# Patient Record
Sex: Female | Born: 1945 | Race: Black or African American | Hispanic: No | Marital: Married | Smoking: Former smoker
Health system: Southern US, Community
[De-identification: ages and names within clinical notes are randomized; demographics above are authoritative.]

## PROBLEM LIST (undated history)

## (undated) DIAGNOSIS — K219 Gastro-esophageal reflux disease without esophagitis: Secondary | ICD-10-CM

## (undated) DIAGNOSIS — C50919 Malignant neoplasm of unspecified site of unspecified female breast: Secondary | ICD-10-CM

## (undated) DIAGNOSIS — I10 Essential (primary) hypertension: Secondary | ICD-10-CM

## (undated) DIAGNOSIS — M1990 Unspecified osteoarthritis, unspecified site: Secondary | ICD-10-CM

## (undated) DIAGNOSIS — E781 Pure hyperglyceridemia: Secondary | ICD-10-CM

## (undated) DIAGNOSIS — E669 Obesity, unspecified: Secondary | ICD-10-CM

## (undated) HISTORY — DX: Pure hyperglyceridemia: E78.1

## (undated) HISTORY — PX: CATARACT EXTRACTION: SUR2

## (undated) HISTORY — PX: BILATERAL SALPINGOOPHORECTOMY: SHX1223

## (undated) HISTORY — DX: Unspecified osteoarthritis, unspecified site: M19.90

## (undated) HISTORY — PX: APPENDECTOMY: SHX54

## (undated) HISTORY — PX: TUBAL LIGATION: SHX77

## (undated) HISTORY — PX: TONSILLECTOMY: SUR1361

## (undated) HISTORY — DX: Obesity, unspecified: E66.9

## (undated) HISTORY — DX: Essential (primary) hypertension: I10

## (undated) HISTORY — PX: BREAST LUMPECTOMY: SHX2

---

## 2004-12-03 HISTORY — PX: HYSTEROSCOPY WITH D & C: SHX1775

## 2008-07-20 HISTORY — PX: EYE SURGERY: SHX253

## 2013-08-25 ENCOUNTER — Encounter: Payer: Self-pay | Admitting: Vascular Surgery

## 2013-08-25 ENCOUNTER — Other Ambulatory Visit: Payer: Self-pay | Admitting: *Deleted

## 2013-08-25 DIAGNOSIS — M79609 Pain in unspecified limb: Secondary | ICD-10-CM

## 2013-10-05 ENCOUNTER — Encounter: Payer: Self-pay | Admitting: Vascular Surgery

## 2013-10-06 ENCOUNTER — Ambulatory Visit (HOSPITAL_COMMUNITY)
Admission: RE | Admit: 2013-10-06 | Discharge: 2013-10-06 | Disposition: A | Discharge status: Home / Self Care | Payer: Medicare Other | Source: Ambulatory Visit | Attending: Vascular Surgery | Admitting: Vascular Surgery

## 2013-10-06 ENCOUNTER — Encounter: Payer: Self-pay | Admitting: Vascular Surgery

## 2013-10-06 ENCOUNTER — Ambulatory Visit (INDEPENDENT_AMBULATORY_CARE_PROVIDER_SITE_OTHER): Payer: Medicare Other | Admitting: Vascular Surgery

## 2013-10-06 VITALS — BP 140/74 | HR 72 | Wt 271.3 lb

## 2013-10-06 DIAGNOSIS — M79609 Pain in unspecified limb: Secondary | ICD-10-CM | POA: Insufficient documentation

## 2013-10-06 DIAGNOSIS — I83893 Varicose veins of bilateral lower extremities with other complications: Secondary | ICD-10-CM

## 2013-10-06 NOTE — Progress Notes (Signed)
Referred by:  [place] [address]  Reason for referral: B leg pain and swelling (L>R)  History of Present Illness  Emily Gray is a 68 y.o. ([birth year]) female who presents with chief complaint: swollen pain legs (L>R).  Patient notes, onset of swelling 3-4 months ago, associated with no obvious trigger.  The patient's symptoms include: aching in lateral thigh and medial calf associated with swelling.  The patient has had never history of DVT, no history of pregnancy, known history of varicose vein, no history of venous stasis ulcers, no history of  Lymphedema and no history of skin changes in lower legs.  There is no family history of venous disorders.  The patient has never used compression stockings in the past.  Past Medical History  Diagnosis Date  . Arthritis   . Pure hyperglyceridemia   . Hypertension   . Obesity, unspecified     Past Surgical History  Procedure Laterality Date  . Cataract extraction Left   . Eye surgery Left 07/2008    macro-hole, filled with gel substance at [facility]  . Hysteroscopy w/d&c  12/03/2004  . Appendectomy    . Bilateral salpingoophorectomy    . Tubal ligation Bilateral   . Tonsillectomy      History   Social History  . Marital Status: Married    Spouse Name: N/A    Number of Children: N/A  . Years of Education: N/A   Occupational History  . Not on file.   Social History Main Topics  . Smoking status: Former Research scientist (life sciences)  . Smokeless tobacco: Never Used  . Alcohol Use: No  . Drug Use: No  . Sexual Activity: Not on file   Other Topics Concern  . Not on file   Social History Narrative  . No narrative on file    Family History  Problem Relation Age of Onset  . Hypertension Mother   . Cancer Father   . Hypertension Father     Current Outpatient Prescriptions on File Prior to Visit  Medication Sig Dispense Refill  . Ascorbic Acid (VITAMIN C PO) Take by mouth.      Marland Kitchen aspirin 81 MG tablet  Take 81 mg by mouth daily.      . Cholecalciferol (VITAMIN D PO) Take by mouth.      Marland Kitchen ibuprofen (ADVIL,MOTRIN) 800 MG tablet Take 800 mg by mouth every 8 (eight) hours as needed.      . Iron-Vitamins (GERITOL COMPLETE PO) Take by mouth.      Marland Kitchen lisinopril-hydrochlorothiazide (PRINZIDE,ZESTORETIC) 20-12.5 MG per tablet Take 1 tablet by mouth daily.       No current facility-administered medications on file prior to visit.    No Known Allergies   REVIEW OF SYSTEMS:  (Positives checked otherwise negative)  CARDIOVASCULAR:  []  chest pain, []  chest pressure, []  palpitations, []  shortness of breath when laying flat, []  shortness of breath with exertion,  [x]  pain in feet when walking, []  pain in feet when laying flat, []  history of blood clot in veins (DVT), []  history of phlebitis, [x]  swelling in legs, []  varicose veins  PULMONARY:  []  productive cough, []  asthma, [x]  wheezing  NEUROLOGIC:  []  weakness in arms or legs, []  numbness in arms or legs, []  difficulty speaking or slurred speech, []  temporary loss of vision in one eye, []  dizziness  HEMATOLOGIC:  []  bleeding problems, []  problems with blood clotting too easily  MUSCULOSKEL:  []  joint pain, []  joint  swelling  GASTROINTEST:  []  vomiting blood, []  blood in stool     GENITOURINARY:  []  burning with urination, []  blood in urine  PSYCHIATRIC:  []  history of major depression  INTEGUMENTARY:  []  rashes, []  ulcers  CONSTITUTIONAL:  []  fever, []  chills   Physical Examination Filed Vitals:   10/06/13 1453  BP: 140/74  Pulse: 72  Weight: 271 lb 4.8 oz (123.061 kg)  SpO2: 97%   There is no height on file to calculate BMI.  General: A&O x 3, WDWN  Head: [place]/AT  Ear/Nose/Throat: Hearing grossly intact, nares w/o erythema or drainage, oropharynx w/o Erythema/Exudate  Eyes: PERRLA, EOMI  Neck: Supple, no nuchal rigidity, no palpable LAD  Pulmonary: Sym exp, good air movt, CTAB, no rales, rhonchi, & wheezing  Cardiac: RRR,  Nl S1, S2, no Murmurs, rubs or gallops  Vascular: Vessel Right Left  Radial Palpable Palpable  Brachial Palpable Palpable  Carotid Palpable, without bruit Palpable, without bruit  Aorta Not palpable N/A  Femoral Palpable Palpable  Popliteal Not palpable Not palpable  PT Palpable Palpable  DP Palpable Palpable   Gastrointestinal: soft, NTND, -G/R, - HSM, - masses, - CVAT B  Musculoskeletal: M/S 5/5 throughout , Extremities without ischemic changes , mild edema BLE, faint varicosities, no LDS  Neurologic: CN 2-12 intact , Pain and light touch intact in extremities , Motor exam as listed above  Psychiatric: Judgment intact, Mood & affect appropriate for pt's clinical situation  Dermatologic: See M/S exam for extremity exam, no rashes otherwise noted  Lymph : No Cervical, Axillary, or Inguinal lymphadenopathy   Non-Invasive Vascular Imaging  BLE Venous Insufficiency Duplex (Date: 10/06/2013):   RLE: no DVT and SVT, no GSV reflux, + deep venous reflux  LLE: no DVT and SVT, + GSV reflux, + deep venous reflux  Outside Studies/Documentation 4 pages of outside documents were reviewed including: OB/GYN chart.  Medical Decision Making  Emily Gray is a 68 y.o. female who presents with: BLE chronic venous insufficiency (C2).   Based on the patient's history and examination, I recommend: compressive therapy.  I discussed with the patient the use of her 20-30 mm thigh high compression stockings and need for 3 month trial of such.  The patient will follow up in 3 months with my partners in the [facility] for evaluation for: EVLA LLE GSV.  Thank you for allowing [place] to participate in this patient's care.  Adele Barthel, MD [facility] Office: [phone] Pager: [phone]  10/06/2013, 3:32 PM

## 2014-01-16 ENCOUNTER — Ambulatory Visit: Admitting: Vascular Surgery

## 2014-08-07 DIAGNOSIS — R0600 Dyspnea, unspecified: Secondary | ICD-10-CM | POA: Insufficient documentation

## 2014-08-07 DIAGNOSIS — I119 Hypertensive heart disease without heart failure: Secondary | ICD-10-CM | POA: Insufficient documentation

## 2016-07-21 DIAGNOSIS — Z7982 Long term (current) use of aspirin: Secondary | ICD-10-CM | POA: Diagnosis not present

## 2016-07-21 DIAGNOSIS — I10 Essential (primary) hypertension: Secondary | ICD-10-CM | POA: Diagnosis not present

## 2016-07-21 DIAGNOSIS — Z87891 Personal history of nicotine dependence: Secondary | ICD-10-CM | POA: Diagnosis not present

## 2016-07-21 DIAGNOSIS — C50311 Malignant neoplasm of lower-inner quadrant of right female breast: Secondary | ICD-10-CM | POA: Diagnosis not present

## 2016-07-21 DIAGNOSIS — Z79899 Other long term (current) drug therapy: Secondary | ICD-10-CM | POA: Diagnosis not present

## 2016-07-21 DIAGNOSIS — C50911 Malignant neoplasm of unspecified site of right female breast: Secondary | ICD-10-CM | POA: Diagnosis not present

## 2016-07-21 DIAGNOSIS — Z170 Estrogen receptor positive status [ER+]: Secondary | ICD-10-CM | POA: Diagnosis not present

## 2016-08-04 DIAGNOSIS — I10 Essential (primary) hypertension: Secondary | ICD-10-CM | POA: Diagnosis not present

## 2016-08-04 DIAGNOSIS — E669 Obesity, unspecified: Secondary | ICD-10-CM | POA: Diagnosis not present

## 2016-08-04 DIAGNOSIS — C50919 Malignant neoplasm of unspecified site of unspecified female breast: Secondary | ICD-10-CM | POA: Diagnosis not present

## 2016-08-04 DIAGNOSIS — Z803 Family history of malignant neoplasm of breast: Secondary | ICD-10-CM | POA: Diagnosis not present

## 2016-08-04 DIAGNOSIS — Z7982 Long term (current) use of aspirin: Secondary | ICD-10-CM | POA: Diagnosis not present

## 2016-08-04 DIAGNOSIS — Z170 Estrogen receptor positive status [ER+]: Secondary | ICD-10-CM | POA: Diagnosis not present

## 2016-08-04 DIAGNOSIS — Z79899 Other long term (current) drug therapy: Secondary | ICD-10-CM | POA: Diagnosis not present

## 2016-08-04 DIAGNOSIS — G4730 Sleep apnea, unspecified: Secondary | ICD-10-CM | POA: Diagnosis not present

## 2016-08-04 DIAGNOSIS — C50811 Malignant neoplasm of overlapping sites of right female breast: Secondary | ICD-10-CM | POA: Diagnosis not present

## 2016-08-04 DIAGNOSIS — M1389 Other specified arthritis, multiple sites: Secondary | ICD-10-CM | POA: Diagnosis not present

## 2016-08-13 DIAGNOSIS — M1389 Other specified arthritis, multiple sites: Secondary | ICD-10-CM | POA: Diagnosis not present

## 2016-08-13 DIAGNOSIS — G4730 Sleep apnea, unspecified: Secondary | ICD-10-CM | POA: Diagnosis not present

## 2016-08-13 DIAGNOSIS — Z7982 Long term (current) use of aspirin: Secondary | ICD-10-CM | POA: Diagnosis not present

## 2016-08-13 DIAGNOSIS — Z79899 Other long term (current) drug therapy: Secondary | ICD-10-CM | POA: Diagnosis not present

## 2016-08-13 DIAGNOSIS — C50811 Malignant neoplasm of overlapping sites of right female breast: Secondary | ICD-10-CM | POA: Diagnosis not present

## 2016-08-13 DIAGNOSIS — Z510 Encounter for antineoplastic radiation therapy: Secondary | ICD-10-CM | POA: Diagnosis not present

## 2016-08-13 DIAGNOSIS — Z170 Estrogen receptor positive status [ER+]: Secondary | ICD-10-CM | POA: Diagnosis not present

## 2016-09-01 DIAGNOSIS — C50811 Malignant neoplasm of overlapping sites of right female breast: Secondary | ICD-10-CM | POA: Diagnosis not present

## 2016-09-01 DIAGNOSIS — Z510 Encounter for antineoplastic radiation therapy: Secondary | ICD-10-CM | POA: Diagnosis not present

## 2016-09-03 DIAGNOSIS — Z510 Encounter for antineoplastic radiation therapy: Secondary | ICD-10-CM | POA: Diagnosis not present

## 2016-09-03 DIAGNOSIS — C50811 Malignant neoplasm of overlapping sites of right female breast: Secondary | ICD-10-CM | POA: Diagnosis not present

## 2016-09-04 DIAGNOSIS — C50811 Malignant neoplasm of overlapping sites of right female breast: Secondary | ICD-10-CM | POA: Diagnosis not present

## 2016-09-04 DIAGNOSIS — Z510 Encounter for antineoplastic radiation therapy: Secondary | ICD-10-CM | POA: Diagnosis not present

## 2016-09-07 DIAGNOSIS — C50811 Malignant neoplasm of overlapping sites of right female breast: Secondary | ICD-10-CM | POA: Diagnosis not present

## 2016-09-07 DIAGNOSIS — Z510 Encounter for antineoplastic radiation therapy: Secondary | ICD-10-CM | POA: Diagnosis not present

## 2016-09-08 DIAGNOSIS — C50811 Malignant neoplasm of overlapping sites of right female breast: Secondary | ICD-10-CM | POA: Diagnosis not present

## 2016-09-08 DIAGNOSIS — Z510 Encounter for antineoplastic radiation therapy: Secondary | ICD-10-CM | POA: Diagnosis not present

## 2016-09-09 DIAGNOSIS — Z510 Encounter for antineoplastic radiation therapy: Secondary | ICD-10-CM | POA: Diagnosis not present

## 2016-09-09 DIAGNOSIS — C50811 Malignant neoplasm of overlapping sites of right female breast: Secondary | ICD-10-CM | POA: Diagnosis not present

## 2016-09-10 DIAGNOSIS — Z510 Encounter for antineoplastic radiation therapy: Secondary | ICD-10-CM | POA: Diagnosis not present

## 2016-09-10 DIAGNOSIS — C50811 Malignant neoplasm of overlapping sites of right female breast: Secondary | ICD-10-CM | POA: Diagnosis not present

## 2016-09-11 DIAGNOSIS — C50811 Malignant neoplasm of overlapping sites of right female breast: Secondary | ICD-10-CM | POA: Diagnosis not present

## 2016-09-11 DIAGNOSIS — Z510 Encounter for antineoplastic radiation therapy: Secondary | ICD-10-CM | POA: Diagnosis not present

## 2016-09-15 DIAGNOSIS — C50811 Malignant neoplasm of overlapping sites of right female breast: Secondary | ICD-10-CM | POA: Diagnosis not present

## 2016-09-15 DIAGNOSIS — Z510 Encounter for antineoplastic radiation therapy: Secondary | ICD-10-CM | POA: Diagnosis not present

## 2016-09-16 DIAGNOSIS — Z510 Encounter for antineoplastic radiation therapy: Secondary | ICD-10-CM | POA: Diagnosis not present

## 2016-09-16 DIAGNOSIS — C50811 Malignant neoplasm of overlapping sites of right female breast: Secondary | ICD-10-CM | POA: Diagnosis not present

## 2016-09-17 DIAGNOSIS — Z510 Encounter for antineoplastic radiation therapy: Secondary | ICD-10-CM | POA: Diagnosis not present

## 2016-09-17 DIAGNOSIS — G4733 Obstructive sleep apnea (adult) (pediatric): Secondary | ICD-10-CM | POA: Diagnosis not present

## 2016-09-17 DIAGNOSIS — I10 Essential (primary) hypertension: Secondary | ICD-10-CM | POA: Diagnosis not present

## 2016-09-17 DIAGNOSIS — C50811 Malignant neoplasm of overlapping sites of right female breast: Secondary | ICD-10-CM | POA: Diagnosis not present

## 2016-09-18 DIAGNOSIS — Z510 Encounter for antineoplastic radiation therapy: Secondary | ICD-10-CM | POA: Diagnosis not present

## 2016-09-18 DIAGNOSIS — C50811 Malignant neoplasm of overlapping sites of right female breast: Secondary | ICD-10-CM | POA: Diagnosis not present

## 2016-09-21 DIAGNOSIS — Z510 Encounter for antineoplastic radiation therapy: Secondary | ICD-10-CM | POA: Diagnosis not present

## 2016-09-21 DIAGNOSIS — C50811 Malignant neoplasm of overlapping sites of right female breast: Secondary | ICD-10-CM | POA: Diagnosis not present

## 2016-09-22 DIAGNOSIS — Z510 Encounter for antineoplastic radiation therapy: Secondary | ICD-10-CM | POA: Diagnosis not present

## 2016-09-22 DIAGNOSIS — C50811 Malignant neoplasm of overlapping sites of right female breast: Secondary | ICD-10-CM | POA: Diagnosis not present

## 2016-09-23 DIAGNOSIS — Z510 Encounter for antineoplastic radiation therapy: Secondary | ICD-10-CM | POA: Diagnosis not present

## 2016-09-23 DIAGNOSIS — C50811 Malignant neoplasm of overlapping sites of right female breast: Secondary | ICD-10-CM | POA: Diagnosis not present

## 2016-09-24 DIAGNOSIS — Z510 Encounter for antineoplastic radiation therapy: Secondary | ICD-10-CM | POA: Diagnosis not present

## 2016-09-24 DIAGNOSIS — C50811 Malignant neoplasm of overlapping sites of right female breast: Secondary | ICD-10-CM | POA: Diagnosis not present

## 2016-09-25 DIAGNOSIS — Z510 Encounter for antineoplastic radiation therapy: Secondary | ICD-10-CM | POA: Diagnosis not present

## 2016-09-25 DIAGNOSIS — C50811 Malignant neoplasm of overlapping sites of right female breast: Secondary | ICD-10-CM | POA: Diagnosis not present

## 2016-09-28 DIAGNOSIS — Z510 Encounter for antineoplastic radiation therapy: Secondary | ICD-10-CM | POA: Diagnosis not present

## 2016-09-28 DIAGNOSIS — C50811 Malignant neoplasm of overlapping sites of right female breast: Secondary | ICD-10-CM | POA: Diagnosis not present

## 2016-09-29 DIAGNOSIS — Z510 Encounter for antineoplastic radiation therapy: Secondary | ICD-10-CM | POA: Diagnosis not present

## 2016-09-29 DIAGNOSIS — C50811 Malignant neoplasm of overlapping sites of right female breast: Secondary | ICD-10-CM | POA: Diagnosis not present

## 2016-09-30 DIAGNOSIS — Z510 Encounter for antineoplastic radiation therapy: Secondary | ICD-10-CM | POA: Diagnosis not present

## 2016-09-30 DIAGNOSIS — C50811 Malignant neoplasm of overlapping sites of right female breast: Secondary | ICD-10-CM | POA: Diagnosis not present

## 2016-10-01 DIAGNOSIS — M1990 Unspecified osteoarthritis, unspecified site: Secondary | ICD-10-CM | POA: Diagnosis not present

## 2016-10-01 DIAGNOSIS — C50811 Malignant neoplasm of overlapping sites of right female breast: Secondary | ICD-10-CM | POA: Diagnosis not present

## 2016-10-01 DIAGNOSIS — Z510 Encounter for antineoplastic radiation therapy: Secondary | ICD-10-CM | POA: Diagnosis not present

## 2016-10-02 DIAGNOSIS — Z510 Encounter for antineoplastic radiation therapy: Secondary | ICD-10-CM | POA: Diagnosis not present

## 2016-10-02 DIAGNOSIS — C50811 Malignant neoplasm of overlapping sites of right female breast: Secondary | ICD-10-CM | POA: Diagnosis not present

## 2016-10-05 DIAGNOSIS — Z510 Encounter for antineoplastic radiation therapy: Secondary | ICD-10-CM | POA: Diagnosis not present

## 2016-10-05 DIAGNOSIS — C50811 Malignant neoplasm of overlapping sites of right female breast: Secondary | ICD-10-CM | POA: Diagnosis not present

## 2016-10-06 DIAGNOSIS — Z510 Encounter for antineoplastic radiation therapy: Secondary | ICD-10-CM | POA: Diagnosis not present

## 2016-10-06 DIAGNOSIS — C50811 Malignant neoplasm of overlapping sites of right female breast: Secondary | ICD-10-CM | POA: Diagnosis not present

## 2016-10-12 DIAGNOSIS — Z510 Encounter for antineoplastic radiation therapy: Secondary | ICD-10-CM | POA: Diagnosis not present

## 2016-10-12 DIAGNOSIS — C50811 Malignant neoplasm of overlapping sites of right female breast: Secondary | ICD-10-CM | POA: Diagnosis not present

## 2016-10-13 DIAGNOSIS — C50811 Malignant neoplasm of overlapping sites of right female breast: Secondary | ICD-10-CM | POA: Diagnosis not present

## 2016-10-13 DIAGNOSIS — Z510 Encounter for antineoplastic radiation therapy: Secondary | ICD-10-CM | POA: Diagnosis not present

## 2016-10-14 DIAGNOSIS — Z510 Encounter for antineoplastic radiation therapy: Secondary | ICD-10-CM | POA: Diagnosis not present

## 2016-10-14 DIAGNOSIS — C50811 Malignant neoplasm of overlapping sites of right female breast: Secondary | ICD-10-CM | POA: Diagnosis not present

## 2016-10-15 DIAGNOSIS — Z510 Encounter for antineoplastic radiation therapy: Secondary | ICD-10-CM | POA: Diagnosis not present

## 2016-10-15 DIAGNOSIS — C50811 Malignant neoplasm of overlapping sites of right female breast: Secondary | ICD-10-CM | POA: Diagnosis not present

## 2016-10-16 DIAGNOSIS — C50811 Malignant neoplasm of overlapping sites of right female breast: Secondary | ICD-10-CM | POA: Diagnosis not present

## 2016-10-16 DIAGNOSIS — Z510 Encounter for antineoplastic radiation therapy: Secondary | ICD-10-CM | POA: Diagnosis not present

## 2016-10-20 DIAGNOSIS — C50811 Malignant neoplasm of overlapping sites of right female breast: Secondary | ICD-10-CM | POA: Diagnosis not present

## 2016-10-20 DIAGNOSIS — Z170 Estrogen receptor positive status [ER+]: Secondary | ICD-10-CM | POA: Diagnosis not present

## 2016-10-20 DIAGNOSIS — N644 Mastodynia: Secondary | ICD-10-CM | POA: Diagnosis not present

## 2016-10-20 DIAGNOSIS — L539 Erythematous condition, unspecified: Secondary | ICD-10-CM | POA: Diagnosis not present

## 2016-10-20 DIAGNOSIS — Z510 Encounter for antineoplastic radiation therapy: Secondary | ICD-10-CM | POA: Diagnosis not present

## 2016-10-20 DIAGNOSIS — R234 Changes in skin texture: Secondary | ICD-10-CM | POA: Diagnosis not present

## 2016-10-21 DIAGNOSIS — C50811 Malignant neoplasm of overlapping sites of right female breast: Secondary | ICD-10-CM | POA: Diagnosis not present

## 2016-10-21 DIAGNOSIS — N644 Mastodynia: Secondary | ICD-10-CM | POA: Diagnosis not present

## 2016-10-21 DIAGNOSIS — R234 Changes in skin texture: Secondary | ICD-10-CM | POA: Diagnosis not present

## 2016-10-21 DIAGNOSIS — Z170 Estrogen receptor positive status [ER+]: Secondary | ICD-10-CM | POA: Diagnosis not present

## 2016-10-21 DIAGNOSIS — L539 Erythematous condition, unspecified: Secondary | ICD-10-CM | POA: Diagnosis not present

## 2016-10-21 DIAGNOSIS — Z510 Encounter for antineoplastic radiation therapy: Secondary | ICD-10-CM | POA: Diagnosis not present

## 2016-10-22 DIAGNOSIS — Z170 Estrogen receptor positive status [ER+]: Secondary | ICD-10-CM | POA: Diagnosis not present

## 2016-10-22 DIAGNOSIS — N644 Mastodynia: Secondary | ICD-10-CM | POA: Diagnosis not present

## 2016-10-22 DIAGNOSIS — R234 Changes in skin texture: Secondary | ICD-10-CM | POA: Diagnosis not present

## 2016-10-22 DIAGNOSIS — L539 Erythematous condition, unspecified: Secondary | ICD-10-CM | POA: Diagnosis not present

## 2016-10-22 DIAGNOSIS — C50811 Malignant neoplasm of overlapping sites of right female breast: Secondary | ICD-10-CM | POA: Diagnosis not present

## 2016-10-22 DIAGNOSIS — Z510 Encounter for antineoplastic radiation therapy: Secondary | ICD-10-CM | POA: Diagnosis not present

## 2016-10-23 DIAGNOSIS — N644 Mastodynia: Secondary | ICD-10-CM | POA: Diagnosis not present

## 2016-10-23 DIAGNOSIS — L539 Erythematous condition, unspecified: Secondary | ICD-10-CM | POA: Diagnosis not present

## 2016-10-23 DIAGNOSIS — C50811 Malignant neoplasm of overlapping sites of right female breast: Secondary | ICD-10-CM | POA: Diagnosis not present

## 2016-10-23 DIAGNOSIS — R234 Changes in skin texture: Secondary | ICD-10-CM | POA: Diagnosis not present

## 2016-10-23 DIAGNOSIS — Z510 Encounter for antineoplastic radiation therapy: Secondary | ICD-10-CM | POA: Diagnosis not present

## 2016-10-23 DIAGNOSIS — Z170 Estrogen receptor positive status [ER+]: Secondary | ICD-10-CM | POA: Diagnosis not present

## 2016-10-26 DIAGNOSIS — L539 Erythematous condition, unspecified: Secondary | ICD-10-CM | POA: Diagnosis not present

## 2016-10-26 DIAGNOSIS — Z170 Estrogen receptor positive status [ER+]: Secondary | ICD-10-CM | POA: Diagnosis not present

## 2016-10-26 DIAGNOSIS — N644 Mastodynia: Secondary | ICD-10-CM | POA: Diagnosis not present

## 2016-10-26 DIAGNOSIS — R234 Changes in skin texture: Secondary | ICD-10-CM | POA: Diagnosis not present

## 2016-10-26 DIAGNOSIS — Z510 Encounter for antineoplastic radiation therapy: Secondary | ICD-10-CM | POA: Diagnosis not present

## 2016-10-26 DIAGNOSIS — C50811 Malignant neoplasm of overlapping sites of right female breast: Secondary | ICD-10-CM | POA: Diagnosis not present

## 2017-01-13 DIAGNOSIS — C50919 Malignant neoplasm of unspecified site of unspecified female breast: Secondary | ICD-10-CM | POA: Diagnosis not present

## 2017-01-19 DIAGNOSIS — C50319 Malignant neoplasm of lower-inner quadrant of unspecified female breast: Secondary | ICD-10-CM | POA: Diagnosis not present

## 2017-01-19 DIAGNOSIS — Z170 Estrogen receptor positive status [ER+]: Secondary | ICD-10-CM | POA: Diagnosis not present

## 2017-01-19 DIAGNOSIS — Z79899 Other long term (current) drug therapy: Secondary | ICD-10-CM | POA: Diagnosis not present

## 2017-01-19 DIAGNOSIS — I10 Essential (primary) hypertension: Secondary | ICD-10-CM | POA: Diagnosis not present

## 2017-01-19 DIAGNOSIS — C50311 Malignant neoplasm of lower-inner quadrant of right female breast: Secondary | ICD-10-CM | POA: Diagnosis not present

## 2017-01-19 DIAGNOSIS — Z87891 Personal history of nicotine dependence: Secondary | ICD-10-CM | POA: Diagnosis not present

## 2017-01-19 DIAGNOSIS — Z7982 Long term (current) use of aspirin: Secondary | ICD-10-CM | POA: Diagnosis not present

## 2017-02-03 DIAGNOSIS — Z13820 Encounter for screening for osteoporosis: Secondary | ICD-10-CM | POA: Diagnosis not present

## 2017-02-03 DIAGNOSIS — I10 Essential (primary) hypertension: Secondary | ICD-10-CM | POA: Diagnosis not present

## 2017-07-02 DIAGNOSIS — H25811 Combined forms of age-related cataract, right eye: Secondary | ICD-10-CM | POA: Diagnosis not present

## 2017-07-02 DIAGNOSIS — H401134 Primary open-angle glaucoma, bilateral, indeterminate stage: Secondary | ICD-10-CM | POA: Diagnosis not present

## 2017-07-02 DIAGNOSIS — Z961 Presence of intraocular lens: Secondary | ICD-10-CM | POA: Diagnosis not present

## 2017-07-14 DIAGNOSIS — R928 Other abnormal and inconclusive findings on diagnostic imaging of breast: Secondary | ICD-10-CM | POA: Diagnosis not present

## 2017-08-10 DIAGNOSIS — I10 Essential (primary) hypertension: Secondary | ICD-10-CM | POA: Diagnosis not present

## 2017-08-26 DIAGNOSIS — R7309 Other abnormal glucose: Secondary | ICD-10-CM | POA: Diagnosis not present

## 2017-10-11 DIAGNOSIS — M791 Myalgia, unspecified site: Secondary | ICD-10-CM | POA: Diagnosis not present

## 2017-10-11 DIAGNOSIS — I10 Essential (primary) hypertension: Secondary | ICD-10-CM | POA: Diagnosis not present

## 2017-10-15 DIAGNOSIS — G4733 Obstructive sleep apnea (adult) (pediatric): Secondary | ICD-10-CM | POA: Diagnosis not present

## 2017-10-15 DIAGNOSIS — I10 Essential (primary) hypertension: Secondary | ICD-10-CM | POA: Diagnosis not present

## 2018-05-26 DIAGNOSIS — I10 Essential (primary) hypertension: Secondary | ICD-10-CM | POA: Diagnosis not present

## 2018-05-26 DIAGNOSIS — R262 Difficulty in walking, not elsewhere classified: Secondary | ICD-10-CM | POA: Diagnosis not present

## 2018-05-26 DIAGNOSIS — E669 Obesity, unspecified: Secondary | ICD-10-CM | POA: Diagnosis not present

## 2018-05-26 DIAGNOSIS — Z79899 Other long term (current) drug therapy: Secondary | ICD-10-CM | POA: Diagnosis not present

## 2018-05-26 DIAGNOSIS — Z7982 Long term (current) use of aspirin: Secondary | ICD-10-CM | POA: Diagnosis not present

## 2018-05-26 DIAGNOSIS — R531 Weakness: Secondary | ICD-10-CM | POA: Diagnosis not present

## 2018-05-26 DIAGNOSIS — C50319 Malignant neoplasm of lower-inner quadrant of unspecified female breast: Secondary | ICD-10-CM | POA: Diagnosis not present

## 2018-05-26 DIAGNOSIS — M170 Bilateral primary osteoarthritis of knee: Secondary | ICD-10-CM | POA: Diagnosis not present

## 2018-05-26 DIAGNOSIS — C50311 Malignant neoplasm of lower-inner quadrant of right female breast: Secondary | ICD-10-CM | POA: Diagnosis not present

## 2018-05-26 DIAGNOSIS — Z170 Estrogen receptor positive status [ER+]: Secondary | ICD-10-CM | POA: Diagnosis not present

## 2018-05-26 DIAGNOSIS — Z87891 Personal history of nicotine dependence: Secondary | ICD-10-CM | POA: Diagnosis not present

## 2018-10-12 DIAGNOSIS — G4733 Obstructive sleep apnea (adult) (pediatric): Secondary | ICD-10-CM | POA: Diagnosis not present

## 2018-10-12 DIAGNOSIS — I10 Essential (primary) hypertension: Secondary | ICD-10-CM | POA: Diagnosis not present

## 2018-10-12 DIAGNOSIS — Z6841 Body Mass Index (BMI) 40.0 and over, adult: Secondary | ICD-10-CM | POA: Diagnosis not present

## 2018-10-17 ENCOUNTER — Other Ambulatory Visit: Payer: Self-pay

## 2018-10-17 ENCOUNTER — Encounter (HOSPITAL_BASED_OUTPATIENT_CLINIC_OR_DEPARTMENT_OTHER): Payer: Self-pay | Admitting: Emergency Medicine

## 2018-10-17 ENCOUNTER — Inpatient Hospital Stay (HOSPITAL_BASED_OUTPATIENT_CLINIC_OR_DEPARTMENT_OTHER)
Admission: EM | Admit: 2018-10-17 | Discharge: 2018-10-20 | DRG: 638 | Disposition: A | Discharge status: Home Health | Payer: Medicare Other | Attending: Internal Medicine | Admitting: Internal Medicine

## 2018-10-17 ENCOUNTER — Emergency Department (HOSPITAL_BASED_OUTPATIENT_CLINIC_OR_DEPARTMENT_OTHER): Payer: Medicare Other

## 2018-10-17 DIAGNOSIS — I8390 Asymptomatic varicose veins of unspecified lower extremity: Secondary | ICD-10-CM | POA: Diagnosis not present

## 2018-10-17 DIAGNOSIS — E669 Obesity, unspecified: Secondary | ICD-10-CM

## 2018-10-17 DIAGNOSIS — R0600 Dyspnea, unspecified: Secondary | ICD-10-CM | POA: Diagnosis not present

## 2018-10-17 DIAGNOSIS — Z853 Personal history of malignant neoplasm of breast: Secondary | ICD-10-CM | POA: Diagnosis not present

## 2018-10-17 DIAGNOSIS — G4733 Obstructive sleep apnea (adult) (pediatric): Secondary | ICD-10-CM | POA: Diagnosis present

## 2018-10-17 DIAGNOSIS — Z8249 Family history of ischemic heart disease and other diseases of the circulatory system: Secondary | ICD-10-CM

## 2018-10-17 DIAGNOSIS — Z791 Long term (current) use of non-steroidal anti-inflammatories (NSAID): Secondary | ICD-10-CM

## 2018-10-17 DIAGNOSIS — I10 Essential (primary) hypertension: Secondary | ICD-10-CM | POA: Diagnosis not present

## 2018-10-17 DIAGNOSIS — Z79899 Other long term (current) drug therapy: Secondary | ICD-10-CM

## 2018-10-17 DIAGNOSIS — K219 Gastro-esophageal reflux disease without esophagitis: Secondary | ICD-10-CM | POA: Diagnosis present

## 2018-10-17 DIAGNOSIS — I491 Atrial premature depolarization: Secondary | ICD-10-CM | POA: Diagnosis not present

## 2018-10-17 DIAGNOSIS — Z7982 Long term (current) use of aspirin: Secondary | ICD-10-CM

## 2018-10-17 DIAGNOSIS — Z23 Encounter for immunization: Secondary | ICD-10-CM

## 2018-10-17 DIAGNOSIS — Z6841 Body Mass Index (BMI) 40.0 and over, adult: Secondary | ICD-10-CM

## 2018-10-17 DIAGNOSIS — E119 Type 2 diabetes mellitus without complications: Secondary | ICD-10-CM

## 2018-10-17 DIAGNOSIS — E1110 Type 2 diabetes mellitus with ketoacidosis without coma: Principal | ICD-10-CM | POA: Diagnosis present

## 2018-10-17 DIAGNOSIS — R739 Hyperglycemia, unspecified: Secondary | ICD-10-CM | POA: Diagnosis not present

## 2018-10-17 DIAGNOSIS — N179 Acute kidney failure, unspecified: Secondary | ICD-10-CM | POA: Diagnosis not present

## 2018-10-17 DIAGNOSIS — Z809 Family history of malignant neoplasm, unspecified: Secondary | ICD-10-CM | POA: Diagnosis not present

## 2018-10-17 DIAGNOSIS — R0602 Shortness of breath: Secondary | ICD-10-CM | POA: Diagnosis not present

## 2018-10-17 DIAGNOSIS — E860 Dehydration: Secondary | ICD-10-CM | POA: Diagnosis present

## 2018-10-17 DIAGNOSIS — C50919 Malignant neoplasm of unspecified site of unspecified female breast: Secondary | ICD-10-CM | POA: Diagnosis present

## 2018-10-17 HISTORY — DX: Malignant neoplasm of unspecified site of unspecified female breast: C50.919

## 2018-10-17 HISTORY — DX: Gastro-esophageal reflux disease without esophagitis: K21.9

## 2018-10-17 LAB — COMPREHENSIVE METABOLIC PANEL
ALT: 42 U/L (ref 0–44)
AST: 35 U/L (ref 15–41)
Albumin: 4 g/dL (ref 3.5–5.0)
Alkaline Phosphatase: 180 U/L — ABNORMAL HIGH (ref 38–126)
Anion gap: 18 — ABNORMAL HIGH (ref 5–15)
BUN: 41 mg/dL — AB (ref 8–23)
CO2: 20 mmol/L — ABNORMAL LOW (ref 22–32)
Calcium: 9.7 mg/dL (ref 8.9–10.3)
Chloride: 82 mmol/L — ABNORMAL LOW (ref 98–111)
Creatinine, Ser: 1.17 mg/dL — ABNORMAL HIGH (ref 0.44–1.00)
GFR calc Af Amer: 54 mL/min — ABNORMAL LOW (ref >60)
GFR calc non Af Amer: 47 mL/min — ABNORMAL LOW (ref >60)
Glucose, Bld: 744 mg/dL (ref 70–99)
Potassium: 4.7 mmol/L (ref 3.5–5.1)
Sodium: 120 mmol/L — ABNORMAL LOW (ref 135–145)
Total Bilirubin: 0.9 mg/dL (ref 0.3–1.2)
Total Protein: 7.5 g/dL (ref 6.5–8.1)

## 2018-10-17 LAB — CBC WITH DIFFERENTIAL/PLATELET
Abs Immature Granulocytes: 0.02 10*3/uL (ref 0.00–0.07)
Basophils Absolute: 0 10*3/uL (ref 0.0–0.1)
Basophils Relative: 0 %
Eosinophils Absolute: 0.1 10*3/uL (ref 0.0–0.5)
Eosinophils Relative: 2 %
HCT: 44.3 % (ref 36.0–46.0)
Hemoglobin: 14.8 g/dL (ref 12.0–15.0)
Immature Granulocytes: 0 %
Lymphocytes Relative: 28 %
Lymphs Abs: 1.3 10*3/uL (ref 0.7–4.0)
MCH: 28.8 pg (ref 26.0–34.0)
MCHC: 33.4 g/dL (ref 30.0–36.0)
MCV: 86.2 fL (ref 80.0–100.0)
Monocytes Absolute: 0.5 10*3/uL (ref 0.1–1.0)
Monocytes Relative: 10 %
NEUTROS ABS: 2.8 10*3/uL (ref 1.7–7.7)
Neutrophils Relative %: 60 %
Platelets: 277 10*3/uL (ref 150–400)
RBC: 5.14 MIL/uL — ABNORMAL HIGH (ref 3.87–5.11)
RDW: 12.5 % (ref 11.5–15.5)
WBC: 4.7 10*3/uL (ref 4.0–10.5)
nRBC: 0 % (ref 0.0–0.2)

## 2018-10-17 LAB — LACTIC ACID, PLASMA: Lactic Acid, Venous: 1.6 mmol/L (ref 0.5–1.9)

## 2018-10-17 LAB — TROPONIN I: Troponin I: <0.03 ng/mL (ref <0.03)

## 2018-10-17 LAB — CBG MONITORING, ED
Glucose-Capillary: >600 mg/dL (ref 70–99)
Glucose-Capillary: >600 mg/dL (ref 70–99)

## 2018-10-17 LAB — BRAIN NATRIURETIC PEPTIDE: B Natriuretic Peptide: 14.5 pg/mL (ref 0.0–100.0)

## 2018-10-17 MED ORDER — SODIUM CHLORIDE 0.9 % IV BOLUS
500.0000 mL | Freq: Once | INTRAVENOUS | Status: AC
  Administered 2018-10-17: 500 mL via INTRAVENOUS

## 2018-10-17 MED ORDER — SODIUM CHLORIDE 0.9 % IV SOLN
INTRAVENOUS | Status: DC
  Administered 2018-10-17: 23:00:00 via INTRAVENOUS

## 2018-10-17 MED ORDER — INSULIN ASPART 100 UNIT/ML IV SOLN
10.0000 [IU] | Freq: Once | INTRAVENOUS | Status: AC
  Administered 2018-10-17: 10 [IU] via INTRAVENOUS
  Filled 2018-10-17: qty 1

## 2018-10-17 MED ORDER — DEXTROSE-NACL 5-0.45 % IV SOLN: INTRAVENOUS | Status: DC

## 2018-10-17 MED ORDER — DEXTROSE 50 % IV SOLN: 25.0000 mL | INTRAVENOUS | Status: DC | PRN

## 2018-10-17 MED ORDER — INSULIN REGULAR BOLUS VIA INFUSION
0.0000 [IU] | Freq: Three times a day (TID) | INTRAVENOUS | Status: DC
  Filled 2018-10-17: qty 10

## 2018-10-17 MED ORDER — INSULIN REGULAR(HUMAN) IN NACL 100-0.9 UT/100ML-% IV SOLN
INTRAVENOUS | Status: DC
  Administered 2018-10-17: 5.4 [IU]/h via INTRAVENOUS
  Filled 2018-10-17 (×2): qty 100

## 2018-10-17 NOTE — ED Notes (Signed)
Called [facility] - s/w Pam - advised bed was ready

## 2018-10-17 NOTE — ED Triage Notes (Signed)
Pt is c/o shortness of breath since Tuesday of last week  Pt is also c/o fatigue  Denies fever  Pt wears CPAP at night

## 2018-10-17 NOTE — ED Notes (Signed)
Date and time results received: 10/17/18 2150 (use smartphrase ".now" to insert current time)  Test: glucose Critical Value: 744  Name of Provider Notified: Dr. Lita Mains  Orders Received? Or Actions Taken?: no new orders

## 2018-10-17 NOTE — ED Notes (Signed)
Spoke with pt's daughter  She says the pt has been urinating a lot and been thirsty Has been checking her blood sugar at home and the machine reads high  Daughter states she has been increasing her water intake and decreasing her carb intake but the monitor continues to read high  The pt does not have a primary care doctor at this time

## 2018-10-17 NOTE — ED Notes (Signed)
Pt c/o SOB, she is all scrunched up in bed, pulse ox improves with repositioning. Pt also c/o fatigue and increased thirst and urination.

## 2018-10-17 NOTE — ED Notes (Signed)
Date and time results received: 10/17/18 2150 (use smartphrase ".now" to insert current time)  Test: sodium Critical Value: 120  Name of Provider Notified: Dr. Lita Mains  Orders Received? Or Actions Taken?: no new orders

## 2018-10-17 NOTE — ED Provider Notes (Signed)
[place] EMERGENCY DEPARTMENT Provider Note   CSN: [id number] Arrival date & time: 10/17/18  2037    History   Chief Complaint Chief Complaint  Patient presents with  . Shortness of Breath    HPI Emily Gray is a 73 y.o. female.     HPI Patient presents with several weeks of increased fatigue, dry mouth, urinary frequency.  Has been checking blood sugar at home with her husband's machine and is been reading high.  No history of diabetes.  The last 6 days she has had dyspnea especially with any exertion.  She denies any chest pain, fever or chills.  No recent sick contacts or travel.   Past Medical History:  Diagnosis Date  . Acid reflux   . Arthritis   . Breast cancer ([place])   . Hypertension   . Obesity, unspecified   . Pure hyperglyceridemia     Patient Active Problem List   Diagnosis Date Noted  . Varicose veins of lower extremities with other complications 86/76/1949    Past Surgical History:  Procedure Laterality Date  . APPENDECTOMY    . BILATERAL SALPINGOOPHORECTOMY    . CATARACT EXTRACTION Left   . EYE SURGERY Left 07/2008   macro-hole, filled with gel substance at [facility]  . HYSTEROSCOPY W/D&C  12/03/2004  . TONSILLECTOMY    . TUBAL LIGATION Bilateral      OB History   No obstetric history on file.      Home Medications    Prior to Admission medications   Medication Sig Start Date End Date Taking? Authorizing Provider  Ascorbic Acid (VITAMIN C PO) Take by mouth.    [provider]  aspirin 81 MG tablet Take 81 mg by mouth daily.    [provider]  Cholecalciferol (VITAMIN D PO) Take by mouth.    [provider]  dexlansoprazole (DEXILANT) 60 MG capsule Take 60 mg by mouth daily.    [provider]  ibuprofen (ADVIL,MOTRIN) 800 MG tablet Take 800 mg by mouth every 8 (eight) hours as needed.    [provider]  Iron-Vitamins (GERITOL COMPLETE PO) Take by mouth.    [provider]   lisinopril-hydrochlorothiazide (PRINZIDE,ZESTORETIC) 20-12.5 MG per tablet Take 1 tablet by mouth daily.    [provider]    Family History Family History  Problem Relation Age of Onset  . Hypertension Mother   . Cancer Father   . Hypertension Father     Social History Social History   Tobacco Use  . Smoking status: Former Research scientist (life sciences)  . Smokeless tobacco: Never Used  Substance Use Topics  . Alcohol use: No  . Drug use: No     Allergies   Patient has no known allergies.   Review of Systems Review of Systems  Constitutional: Positive for fatigue. Negative for chills and fever.  HENT: Negative for trouble swallowing.   Eyes: Negative for visual disturbance.  Respiratory: Positive for shortness of breath. Negative for cough and wheezing.   Cardiovascular: Positive for leg swelling. Negative for chest pain and palpitations.  Gastrointestinal: Negative for abdominal pain, constipation, diarrhea, nausea and vomiting.  Endocrine: Positive for polydipsia and polyuria.  Genitourinary: Positive for frequency. Negative for dysuria, flank pain and hematuria.  Musculoskeletal: Negative for back pain, myalgias and neck pain.  Skin: Negative for rash and wound.  Neurological: Negative for dizziness, weakness, light-headedness, numbness and headaches.  All other systems reviewed and are negative.    Physical Exam Updated Vital Signs  BP 126/68   Pulse 91   Temp 98.6 F (37 C) (Oral)   Resp 17   Ht 5\' 4"  (1.626 m)   Wt 117 kg   SpO2 95%   BMI 44.29 kg/m   Physical Exam Vitals signs and nursing note reviewed.  Constitutional:      Appearance: Normal appearance. She is well-developed.  HENT:     Head: Normocephalic and atraumatic.     Nose: Nose normal.     Mouth/Throat:     Mouth: Mucous membranes are moist.  Eyes:     Extraocular Movements: Extraocular movements intact.     Pupils: Pupils are equal, round, and reactive to light.  Neck:     Musculoskeletal:  Normal range of motion and neck supple. No neck rigidity or muscular tenderness.  Cardiovascular:     Rate and Rhythm: Normal rate and regular rhythm.     Heart sounds: No murmur. No friction rub. No gallop.   Pulmonary:     Effort: Pulmonary effort is normal. No respiratory distress.     Breath sounds: Normal breath sounds. No stridor. No wheezing, rhonchi or rales.     Comments: No apparent respiratory distress. Chest:     Chest wall: No tenderness.  Abdominal:     General: Bowel sounds are normal.     Palpations: Abdomen is soft.     Tenderness: There is no abdominal tenderness. There is no guarding or rebound.  Musculoskeletal: Normal range of motion.        General: No swelling, tenderness, deformity or signs of injury.     Right lower leg: Edema present.     Left lower leg: Edema present.     Comments: 1+ bilateral lower extremity pitting edema.  No asymmetry or tenderness to palpation.  Lymphadenopathy:     Cervical: No cervical adenopathy.  Skin:    General: Skin is warm and dry.     Capillary Refill: Capillary refill takes less than 2 seconds.     Findings: No erythema or rash.  Neurological:     General: No focal deficit present.     Mental Status: She is alert and oriented to person, place, and time.  Psychiatric:        Behavior: Behavior normal.      ED Treatments / Results  Labs (all labs ordered are listed, but only abnormal results are displayed) Labs Reviewed  CBC WITH DIFFERENTIAL/PLATELET - Abnormal; Notable for the following components:      Result Value   RBC 5.14 (*)    All other components within normal limits  COMPREHENSIVE METABOLIC PANEL - Abnormal; Notable for the following components:   Sodium 120 (*)    Chloride 82 (*)    CO2 20 (*)    Glucose, Bld 744 (*)    BUN 41 (*)    Creatinine, Ser 1.17 (*)    Alkaline Phosphatase 180 (*)    GFR calc non Af Amer 47 (*)    GFR calc Af Amer 54 (*)    Anion gap 18 (*)    All other components within  normal limits  BRAIN NATRIURETIC PEPTIDE  TROPONIN I  URINALYSIS, ROUTINE W REFLEX MICROSCOPIC  BETA-HYDROXYBUTYRIC ACID  TSH  HEMOGLOBIN A1C  LACTIC ACID, PLASMA    EKG EKG Interpretation  Date/Time:  Monday October 17 2018 21:15:27 EDT Ventricular Rate:  95 PR Interval:    QRS Duration: 82 QT Interval:  385 QTC Calculation: 484 R Axis:   -18  Text Interpretation:  Sinus rhythm Atrial premature complex Probable left atrial enlargement Inferior infarct, old Baseline wander in lead(s) II Confirmed by Julianne Rice [phone]) on 10/17/2018 10:35:09 PM   Radiology Dg Chest 2 View  Result Date: 10/17/2018 CLINICAL DATA:  Shortness of breath since last week, fatigue. History of breast cancer. EXAM: CHEST - 2 VIEW COMPARISON:  None. FINDINGS: Cardiac silhouette is normal in size. Tortuous, potentially ectatic aorta. No pleural effusion or focal consolidation. Mild bronchitic changes. No pneumothorax. Moderate thoracic spondylosis. Surgical clips project in RIGHT breast. IMPRESSION: Mild bronchitic changes without focal consolidation. Electronically Signed   By: Elon Alas M.D.   On: 10/17/2018 21:47    Procedures Procedures (including critical care time)  Medications Ordered in ED Medications  dextrose 5 %-0.45 % sodium chloride infusion (has no administration in time range)  insulin regular bolus via infusion 0-10 Units (has no administration in time range)  insulin regular, human (MYXREDLIN) 100 units/ 100 mL infusion (has no administration in time range)  dextrose 50 % solution 25 mL (has no administration in time range)  0.9 %  sodium chloride infusion (has no administration in time range)  insulin aspart (novoLOG) injection 10 Units (10 Units Intravenous Given 10/17/18 2217)  sodium chloride 0.9 % bolus 500 mL (500 mLs Intravenous New Bag/Given 10/17/18 2216)    CRITICAL CARE Performed by: Julianne Rice Total critical care time: 25 minutes Critical care time was  exclusive of separately billable procedures and treating other patients. Critical care was necessary to treat or prevent imminent or life-threatening deterioration. Critical care was time spent personally by me on the following activities: development of treatment plan with patient and/or surrogate as well as nursing, discussions with consultants, evaluation of patient's response to treatment, examination of patient, obtaining history from patient or surrogate, ordering and performing treatments and interventions, ordering and review of laboratory studies, ordering and review of radiographic studies, pulse oximetry and re-evaluation of patient's condition.  Initial Impression / Assessment and Plan / ED Course  I have reviewed the triage vital signs and the nursing notes.  Pertinent labs & imaging results that were available during my care of the patient were reviewed by me and considered in my medical decision making (see chart for details).       Patient with significant elevation and her blood glucose.  Given dose of IV insulin.  Patient also has bronchitic changes on her chest x-ray.  No evidence of pneumonia.  Given lower extremity pitting edema given fluids judiciously.  Discussed with Dr. Darnell Level.  Will admit [facility]down [facility] at [facility].  Glucose stabilizer drip started.  Final Clinical Impressions(s) / ED Diagnoses   Final diagnoses:  Hyperglycemia  Dyspnea, unspecified type    ED Discharge Orders    None       Julianne Rice, MD 10/17/18 2235

## 2018-10-17 NOTE — Plan of Care (Signed)
73 yo F w HX of DM 2 and     oSA CPAP c/o shortness of breath since Tuesday of last week  Pt is also c/o fatigue  Denies fever  has been urinating a lot and been thirsty Has been checking her blood sugar at home and the machine reads high  The pt does not have a primary care doctor at this time   No fever no travel hx, no URI Daughter been trying to give her plenty of fluids  Noted to have sodium of 120 glucose of 744 Chest x-ray showing mild bronchitic changes but no acute infiltrates AG 18 She has pitting edema  She was given 557ml bolus and 10 units of novolog   Need to repeat Blood glucose  Accepted to Stepdown DKA given elevated anion gap vs HYperglycemic hyperosmolar state   need for glucose stabilizer  Have underlying CHF which she was unaware of versus right heart failure.  Hypo natremic Na corrects to 131 Could have some underlining free water retention  Accepted to [facility] progressive care as there are no stepdown beds at [facility] 10:35 PM

## 2018-10-18 ENCOUNTER — Encounter (HOSPITAL_COMMUNITY): Payer: Self-pay | Admitting: Internal Medicine

## 2018-10-18 DIAGNOSIS — N179 Acute kidney failure, unspecified: Secondary | ICD-10-CM | POA: Diagnosis not present

## 2018-10-18 DIAGNOSIS — I10 Essential (primary) hypertension: Secondary | ICD-10-CM | POA: Diagnosis not present

## 2018-10-18 DIAGNOSIS — K219 Gastro-esophageal reflux disease without esophagitis: Secondary | ICD-10-CM

## 2018-10-18 DIAGNOSIS — R0600 Dyspnea, unspecified: Secondary | ICD-10-CM | POA: Diagnosis not present

## 2018-10-18 DIAGNOSIS — E1010 Type 1 diabetes mellitus with ketoacidosis without coma: Secondary | ICD-10-CM | POA: Diagnosis not present

## 2018-10-18 DIAGNOSIS — E1110 Type 2 diabetes mellitus with ketoacidosis without coma: Secondary | ICD-10-CM | POA: Diagnosis present

## 2018-10-18 DIAGNOSIS — G4733 Obstructive sleep apnea (adult) (pediatric): Secondary | ICD-10-CM | POA: Diagnosis not present

## 2018-10-18 DIAGNOSIS — C50919 Malignant neoplasm of unspecified site of unspecified female breast: Secondary | ICD-10-CM | POA: Diagnosis present

## 2018-10-18 DIAGNOSIS — E119 Type 2 diabetes mellitus without complications: Secondary | ICD-10-CM

## 2018-10-18 LAB — URINALYSIS, ROUTINE W REFLEX MICROSCOPIC
Bilirubin Urine: NEGATIVE
Bilirubin Urine: NEGATIVE
Glucose, UA: >=500 mg/dL — AB
Glucose, UA: >=500 mg/dL — AB
Hgb urine dipstick: NEGATIVE
Ketones, ur: 15 mg/dL — AB
Ketones, ur: 5 mg/dL — AB
Leukocytes,Ua: NEGATIVE
Leukocytes,Ua: NEGATIVE
Nitrite: NEGATIVE
Nitrite: NEGATIVE
Protein, ur: NEGATIVE mg/dL
Protein, ur: NEGATIVE mg/dL
SPECIFIC GRAVITY, URINE: 1.01 (ref 1.005–1.030)
Specific Gravity, Urine: 1.023 (ref 1.005–1.030)
pH: 5 (ref 5.0–8.0)
pH: 5.5 (ref 5.0–8.0)

## 2018-10-18 LAB — CBC WITH DIFFERENTIAL/PLATELET
Abs Immature Granulocytes: 0.02 10*3/uL (ref 0.00–0.07)
BASOS ABS: 0 10*3/uL (ref 0.0–0.1)
Basophils Relative: 1 %
Eosinophils Absolute: 0.1 10*3/uL (ref 0.0–0.5)
Eosinophils Relative: 2 %
HCT: 40.3 % (ref 36.0–46.0)
Hemoglobin: 13.7 g/dL (ref 12.0–15.0)
Immature Granulocytes: 1 %
LYMPHS PCT: 31 %
Lymphs Abs: 1.3 10*3/uL (ref 0.7–4.0)
MCH: 28.5 pg (ref 26.0–34.0)
MCHC: 34 g/dL (ref 30.0–36.0)
MCV: 84 fL (ref 80.0–100.0)
Monocytes Absolute: 0.5 10*3/uL (ref 0.1–1.0)
Monocytes Relative: 12 %
Neutro Abs: 2.3 10*3/uL (ref 1.7–7.7)
Neutrophils Relative %: 53 %
Platelets: 233 10*3/uL (ref 150–400)
RBC: 4.8 MIL/uL (ref 3.87–5.11)
RDW: 12.5 % (ref 11.5–15.5)
WBC: 4.2 10*3/uL (ref 4.0–10.5)
nRBC: 0 % (ref 0.0–0.2)

## 2018-10-18 LAB — GLUCOSE, CAPILLARY
GLUCOSE-CAPILLARY: 236 mg/dL — AB (ref 70–99)
GLUCOSE-CAPILLARY: 217 mg/dL — AB (ref 70–99)
GLUCOSE-CAPILLARY: 150 mg/dL — AB (ref 70–99)
GLUCOSE-CAPILLARY: 363 mg/dL — AB (ref 70–99)
Glucose-Capillary: 406 mg/dL — ABNORMAL HIGH (ref 70–99)
Glucose-Capillary: 348 mg/dL — ABNORMAL HIGH (ref 70–99)
Glucose-Capillary: 318 mg/dL — ABNORMAL HIGH (ref 70–99)
Glucose-Capillary: 217 mg/dL — ABNORMAL HIGH (ref 70–99)
Glucose-Capillary: 215 mg/dL — ABNORMAL HIGH (ref 70–99)
Glucose-Capillary: 166 mg/dL — ABNORMAL HIGH (ref 70–99)
Glucose-Capillary: 185 mg/dL — ABNORMAL HIGH (ref 70–99)
Glucose-Capillary: 136 mg/dL — ABNORMAL HIGH (ref 70–99)
Glucose-Capillary: 125 mg/dL — ABNORMAL HIGH (ref 70–99)
Glucose-Capillary: 138 mg/dL — ABNORMAL HIGH (ref 70–99)
Glucose-Capillary: 216 mg/dL — ABNORMAL HIGH (ref 70–99)
Glucose-Capillary: 257 mg/dL — ABNORMAL HIGH (ref 70–99)
Glucose-Capillary: 352 mg/dL — ABNORMAL HIGH (ref 70–99)
Glucose-Capillary: 397 mg/dL — ABNORMAL HIGH (ref 70–99)

## 2018-10-18 LAB — BASIC METABOLIC PANEL
Anion gap: 14 (ref 5–15)
BUN: 32 mg/dL — ABNORMAL HIGH (ref 8–23)
CO2: 23 mmol/L (ref 22–32)
Calcium: 9.4 mg/dL (ref 8.9–10.3)
Chloride: 97 mmol/L — ABNORMAL LOW (ref 98–111)
Creatinine, Ser: 0.99 mg/dL (ref 0.44–1.00)
GFR calc Af Amer: >60 mL/min (ref >60)
GFR calc non Af Amer: 57 mL/min — ABNORMAL LOW (ref >60)
Glucose, Bld: 249 mg/dL — ABNORMAL HIGH (ref 70–99)
Potassium: 3.8 mmol/L (ref 3.5–5.1)
Sodium: 134 mmol/L — ABNORMAL LOW (ref 135–145)

## 2018-10-18 LAB — COMPREHENSIVE METABOLIC PANEL
ALK PHOS: 96 U/L (ref 38–126)
ALT: 35 U/L (ref 0–44)
ANION GAP: 10 (ref 5–15)
AST: 34 U/L (ref 15–41)
Albumin: 3.4 g/dL — ABNORMAL LOW (ref 3.5–5.0)
BUN: 30 mg/dL — ABNORMAL HIGH (ref 8–23)
CALCIUM: 9.3 mg/dL (ref 8.9–10.3)
CO2: 25 mmol/L (ref 22–32)
Chloride: 101 mmol/L (ref 98–111)
Creatinine, Ser: 0.79 mg/dL (ref 0.44–1.00)
GFR calc Af Amer: >60 mL/min (ref >60)
GFR calc non Af Amer: >60 mL/min (ref >60)
Glucose, Bld: 150 mg/dL — ABNORMAL HIGH (ref 70–99)
POTASSIUM: 3.4 mmol/L — AB (ref 3.5–5.1)
Sodium: 136 mmol/L (ref 135–145)
TOTAL PROTEIN: 6.7 g/dL (ref 6.5–8.1)
Total Bilirubin: 0.8 mg/dL (ref 0.3–1.2)

## 2018-10-18 LAB — HEMOGLOBIN A1C
Hgb A1c MFr Bld: 13.1 % — ABNORMAL HIGH (ref 4.8–5.6)
Mean Plasma Glucose: 329 mg/dL

## 2018-10-18 LAB — MAGNESIUM: Magnesium: 2.1 mg/dL (ref 1.7–2.4)

## 2018-10-18 LAB — URINALYSIS, MICROSCOPIC (REFLEX)

## 2018-10-18 LAB — PHOSPHORUS: Phosphorus: 2.6 mg/dL (ref 2.5–4.6)

## 2018-10-18 MED ORDER — POTASSIUM CHLORIDE 10 MEQ/100ML IV SOLN
10.0000 meq | INTRAVENOUS | Status: AC
  Administered 2018-10-18 (×2): 10 meq via INTRAVENOUS
  Filled 2018-10-18 (×2): qty 100

## 2018-10-18 MED ORDER — VITAMIN C 500 MG PO TABS
250.0000 mg | ORAL_TABLET | Freq: Every day | ORAL | Status: DC
  Administered 2018-10-18 – 2018-10-20 (×3): 250 mg via ORAL
  Filled 2018-10-18 (×3): qty 1

## 2018-10-18 MED ORDER — ADULT MULTIVITAMIN W/MINERALS CH
1.0000 | ORAL_TABLET | Freq: Every day | ORAL | Status: DC
  Administered 2018-10-18 – 2018-10-20 (×3): 1 via ORAL
  Filled 2018-10-18 (×3): qty 1

## 2018-10-18 MED ORDER — HYDRALAZINE HCL 20 MG/ML IJ SOLN: 5.0000 mg | INTRAMUSCULAR | Status: DC | PRN

## 2018-10-18 MED ORDER — LEVALBUTEROL HCL 0.63 MG/3ML IN NEBU: 0.6300 mg | INHALATION_SOLUTION | Freq: Four times a day (QID) | RESPIRATORY_TRACT | Status: DC | PRN

## 2018-10-18 MED ORDER — ASPIRIN EC 81 MG PO TBEC
81.0000 mg | DELAYED_RELEASE_TABLET | Freq: Every day | ORAL | Status: DC
  Administered 2018-10-18 – 2018-10-20 (×3): 81 mg via ORAL
  Filled 2018-10-18 (×3): qty 1

## 2018-10-18 MED ORDER — PANTOPRAZOLE SODIUM 40 MG PO TBEC
40.0000 mg | DELAYED_RELEASE_TABLET | Freq: Every day | ORAL | Status: DC
  Administered 2018-10-18 – 2018-10-20 (×3): 40 mg via ORAL
  Filled 2018-10-18 (×3): qty 1

## 2018-10-18 MED ORDER — LIVING WELL WITH DIABETES BOOK
Freq: Once | Status: AC
  Administered 2018-10-18: 18:00:00

## 2018-10-18 MED ORDER — DEXTROSE-NACL 5-0.45 % IV SOLN
INTRAVENOUS | Status: DC
  Administered 2018-10-18: 04:00:00 via INTRAVENOUS

## 2018-10-18 MED ORDER — ONDANSETRON HCL 4 MG/2ML IJ SOLN: 4.0000 mg | Freq: Three times a day (TID) | INTRAMUSCULAR | Status: DC | PRN

## 2018-10-18 MED ORDER — PNEUMOCOCCAL VAC POLYVALENT 25 MCG/0.5ML IJ INJ
0.5000 mL | INJECTION | INTRAMUSCULAR | Status: AC
  Administered 2018-10-18: 0.5 mL via INTRAMUSCULAR
  Filled 2018-10-18: qty 0.5

## 2018-10-18 MED ORDER — INSULIN STARTER KIT- PEN NEEDLES (ENGLISH): 1.0000 | Freq: Once | Status: DC

## 2018-10-18 MED ORDER — INSULIN GLARGINE 100 UNIT/ML ~~LOC~~ SOLN
10.0000 [IU] | Freq: Every day | SUBCUTANEOUS | Status: DC
  Administered 2018-10-18 – 2018-10-19 (×2): 10 [IU] via SUBCUTANEOUS
  Filled 2018-10-18 (×3): qty 0.1

## 2018-10-18 MED ORDER — ACETAMINOPHEN 325 MG PO TABS: 650.0000 mg | ORAL_TABLET | Freq: Four times a day (QID) | ORAL | Status: DC | PRN

## 2018-10-18 MED ORDER — VITAMIN D 25 MCG (1000 UNIT) PO TABS
1000.0000 [IU] | ORAL_TABLET | Freq: Every day | ORAL | Status: DC
  Administered 2018-10-18 – 2018-10-20 (×3): 1000 [IU] via ORAL
  Filled 2018-10-18 (×3): qty 1

## 2018-10-18 MED ORDER — SODIUM CHLORIDE 0.9 % IV BOLUS
1500.0000 mL | Freq: Once | INTRAVENOUS | Status: AC
  Administered 2018-10-18: 750 mL via INTRAVENOUS

## 2018-10-18 MED ORDER — ENOXAPARIN SODIUM 40 MG/0.4ML ~~LOC~~ SOLN
40.0000 mg | Freq: Every day | SUBCUTANEOUS | Status: DC
  Administered 2018-10-18 – 2018-10-20 (×3): 40 mg via SUBCUTANEOUS
  Filled 2018-10-18 (×3): qty 0.4

## 2018-10-18 MED ORDER — INSULIN ASPART 100 UNIT/ML ~~LOC~~ SOLN
0.0000 [IU] | Freq: Three times a day (TID) | SUBCUTANEOUS | Status: DC
  Administered 2018-10-18: 15 [IU] via SUBCUTANEOUS
  Administered 2018-10-19 (×2): 11 [IU] via SUBCUTANEOUS
  Administered 2018-10-19: 15 [IU] via SUBCUTANEOUS
  Administered 2018-10-20: 8 [IU] via SUBCUTANEOUS
  Administered 2018-10-20: 11 [IU] via SUBCUTANEOUS
  Administered 2018-10-20: 15 [IU] via SUBCUTANEOUS

## 2018-10-18 MED ORDER — SODIUM CHLORIDE 0.9 % IV SOLN: INTRAVENOUS | Status: DC

## 2018-10-18 MED ORDER — INSULIN ASPART 100 UNIT/ML ~~LOC~~ SOLN
0.0000 [IU] | Freq: Every day | SUBCUTANEOUS | Status: DC
  Administered 2018-10-18: 5 [IU] via SUBCUTANEOUS
  Administered 2018-10-19: 4 [IU] via SUBCUTANEOUS

## 2018-10-18 NOTE — Progress Notes (Signed)
The patient was admitted early this morning after midnight and H&P has been reviewed and I am in current agreement with assessment and plan done by Dr. Ivor Costa.  Additional changes to the plan of care been made accordingly.  The patient is a really obese African-American female with a past medical history significant for but not limited to breast cancer, morbid obesity, obstructive sleep apnea on CPAP, hypertension, varicose veins in the lower extremities, with other comorbidities who presented with generalized weakness, polyuria and polydipsia.  Patient has been feeling fatigued for about a week and admitted to having generalized weakness but no unilateral numbness or tingling in extremities.  She does have a history of hyperglycemia previously but denies any history of diabetes mellitus.  She presented to the emergency room she was worked up and found to be in DKA as her bicarbonate was 20 with a blood sugar of 744 and anion gap of 18 and positive ketones.  She is admitted to the progressive unit and started on a glucose stabilizer.  Her anion gap is improved significantly and her CO2 is greater than 202 and she is hungry with no more nausea or vomiting so she is put on a heart healthy carb modified diet and given long-acting insulin.  Her glucose stabilized will be discharged continue within 2 hours of starting long-acting insulin and she is also placed on NovoLog sliding scale insulin before meals and at bedtime.  Diabetes education coordinator will be consulted for further evaluation recommendations and will await her hemoglobin A1c result.  Patient is improving but still complaining of fatigue so we will obtain a physical therapy evaluation as well.  We will continue to monitor patient's clinical response to intervention and repeat blood work in the a.m. and likely discharge home if her blood sugars are stable and controlled.

## 2018-10-18 NOTE — Progress Notes (Addendum)
Nutrition Education Note RD working remotely.  RD consulted for nutrition education regarding new onset diabetes.   No results found for: HGBA1C  RD spoke with patient on the phone. Discussed different food groups and their effects on blood sugar, emphasizing carbohydrate-containing foods. Reviewed recommended serving sizes of common foods.  Discussed importance of controlled and consistent carbohydrate intake throughout the day. Provided examples of ways to balance meals/snacks and encouraged intake of high-fiber, whole grain complex carbohydratesgood. Teach back method used.  Expect fair to good compliance.  Body mass index is 41.17 kg/m. Pt meets criteria for class 3, extreme/morbid obesity based on current BMI.  Current diet order is heart healthy carbohydrate modified, patient is consuming 100% of meals at this time. Labs and medications reviewed. No further nutrition interventions warranted at this time. Attached diabetes heart healthy diet education information to discharge instructions.    Molli Barrows, RD, LDN, [place] Pager [phone] After Hours Pager [phone]

## 2018-10-18 NOTE — ED Notes (Signed)
Report given to Brittany, RN.

## 2018-10-18 NOTE — Progress Notes (Signed)
RN text paged Dr. Blaine Hamper to notify that BMP has resulted.

## 2018-10-18 NOTE — TOC Initial Note (Signed)
Transition of Care [facility]) - Initial/Assessment Note    Patient Details  Name: Emily Gray MRN: [id number] Date of Birth: 1946-03-21  Transition of Care [facility]) CM/SW Contact:    Midge Minium RN, BSN, NCM-BC, ACM-RN [phone] Phone Number: 10/18/2018, 10:56 AM  Clinical Narrative: 73 yo female presented with generalized weakness, polyuria and polydipsia. CM discussed dispositional needs with patient via phone. Patient stated living at home with her spouse and daughter; being independent with her ADLs PTA. Patient confirmed she has no PCP, but agreeable to CM assistance with establishing a new PCP to help manage her diabetes. Insurance verified as: Medicare A/B and [employer]. PCP appointment arranged with: [facility] on 11/17/18 @ 0930; AVS updated. Patient requested DM teaching with her and her daughter post transition with [place] options discussed. CMS [facility] Compare list discussed via phone, and will be provided to patient in her room/placed on shadow chart with no HH preference, per patient. [place] referral for an [facility] for DM education given to [facility] [place] liaison; AVS updated. Patients family will provide transportation home. CM team will continue to follow.   Please enter orders for [facility] and F2F.             Expected Discharge Plan: [place] Barriers to Discharge: Continued Medical Work up   Patient Goals and CMS Choice Patient states their goals for this hospitalization and ongoing recovery are:: "To learn how to manage my diabetes which they said I now have" CMS Medicare.gov Compare Post Acute Care list provided to:: Patient Choice offered to / list presented to : Patient  Expected Discharge Plan and Services Expected Discharge Plan: [place] In-house Referral: PCP / [facility] Discharge Planning Services: CM Consult Post Acute Care Choice: [place] arrangements for the past 2 months: Single Family Home                  DME Arranged: N/A DME Agency: NA HH Arranged: NA HH Agency: [place]  Prior Living Arrangements/Services Living arrangements for the past 2 months: Single Family Home Lives with:: Self, Spouse, Adult Children Patient language and need for interpreter reviewed:: Yes Do you feel safe going back to the place where you live?: Yes      Need for Family Participation in Patient Care: Yes (Comment) Care giver support system in place?: Yes (comment)   Criminal Activity/Legal Involvement Pertinent to Current Situation/Hospitalization: No - Comment as needed  Activities of Daily Living Home Assistive Devices/Equipment: Cane (specify quad or straight), Eyeglasses(cane-walking stick type ) ADL Screening (condition at time of admission) Patient's cognitive ability adequate to safely complete daily activities?: Yes Is the patient deaf or have difficulty hearing?: No Does the patient have difficulty seeing, even when wearing glasses/contacts?: No Does the patient have difficulty concentrating, remembering, or making decisions?: No Patient able to express need for assistance with ADLs?: Yes Does the patient have difficulty dressing or bathing?: No Independently performs ADLs?: Yes (appropriate for developmental age) Does the patient have difficulty walking or climbing stairs?: Yes Weakness of Legs: Both Weakness of Arms/Hands: None  Permission Sought/Granted Permission sought to share information with : Case Manager, PCP, Customer service manager Permission granted to share information with : Yes, Verbal Permission Granted     Permission granted to share info w AGENCY: PCP or [facility] agency         Emotional Assessment   Attitude/Demeanor/Rapport: Gracious, Engaged Affect (typically  observed): Accepting, Appropriate, Pleasant Orientation: : Oriented to Self, Oriented to Place, Oriented to  Time, Oriented to Situation Alcohol / Substance Use: Not  Applicable Psych Involvement: No (comment)  Admission diagnosis:  Hyperglycemia [R73.9] Dyspnea, unspecified type [R06.00] Patient Active Problem List   Diagnosis Date Noted  . DKA (diabetic ketoacidoses) ([place]) 10/18/2018  . OSA (obstructive sleep apnea) 10/18/2018  . AKI (acute kidney injury) ([place]) 10/18/2018  . Diabetes mellitus without complication ([place]) 50/72/2575  . Hypertension   . Acid reflux   . Dyspnea   . Breast cancer ([place])   . Hyperglycemia 10/17/2018  . Varicose veins of lower extremities with other complications 12/04/3356   PCP:  Patient, No Pcp Per Pharmacy:   [id number] [place], [place] [address] Phone: [phone] Fax: [phone]  [place], [place] [facility] [address] [place] [address] [place] [address] Phone: [phone] Fax: [phone]     Social Determinants of Health (SDOH) Interventions    Readmission Risk Interventions No flowsheet data found.

## 2018-10-18 NOTE — Progress Notes (Addendum)
Inpatient Diabetes Program Recommendations  AACE/ADA: New Consensus Statement on Inpatient Glycemic Control (2015)  Target Ranges:  Prepandial:   less than 140 mg/dL      Peak postprandial:   less than 180 mg/dL (1-2 hours)      Critically ill patients:  140 - 180 mg/dL   Lab Results  Component Value Date   GLUCAP 363 (H) 10/18/2018    Note diabetes consult. Spoke to MD at 1600 today via secure chat (Dr. Alfredia Ferguson) regarding my prior progress note (regarding possibly increasing basal insulin dose).  Spoke with patient. She had been told in the past she had high blood sugar but said she "didn't want to deal with it". She said this time she is "going to listen and take it seriously". Her husband has been diabetic for 40 years and he uses a glucometer and insulin pen at home. She said her preference would also be to use the insulin pen. Showed her both the insulin pen and vial/syringe.   Educated patient on insulin pen use at home. Insulin flexpen starter kit has been requested from pharmacy and RN will give when it arrives. Reviewed all steps of insulin pen including attachment of needle, 2-unit air shot, dialing up dose, giving injection, rotation of injection sites, removing needle, disposal of sharps, storage of unused insulin, disposal of insulin etc. Patient able to provide successful return demonstration. Reviewed troubleshooting with insulin pen. Added insulin pen instructions to AVS.  Also reviewed Signs/Symptoms of Hypoglycemia with patient and how to treat Hypoglycemia at home. Have asked RNs caring for patient to please allow patient to give all injections here in hospital as much as possible for practice.   Reviewed "living well with diabetes" book and and food/drink recommendations. (Note dietician also just spoke to patient.) CM order in for help with obtaining PCP. Reinforced with patient importance of being established with an MD and having someone to help her to manage her diabetes.  Discussed glucometer and checking her CBG at home at least twice a day (informed her when it is determined exactly the type of insulin / regimen she is going home on there will be d/c instruction on how often to check her CBG). Instructed to write down her CBG values at home and take back to her PCP for review.   When spoke to MD earlier today he is waiting on the results of the Hgb A1c to determine type of insulin.    MD - at discharge please to give patient prescriptions for :   1. insulin PEN (when type determined) 2. insulin pen needles (Epic # [id number])  3. blood glucose meter kit (includes lancets and strips) Epic order # [id number].    Thank you.   -- Will follow during hospitalization.--  Jonna Clark RN, MSN Diabetes Coordinator Inpatient Glycemic Control Team Team Pager: [phone] (8am-5pm)

## 2018-10-18 NOTE — Care Management Obs Status (Addendum)
[place] NOTIFICATION   Patient Details  Name: Emily Gray MRN: [id number] Date of Birth: 04-21-1946   Medicare Observation Status Notification Given:  Yes  MOON was delivered to patients room.  Midge Minium RN, BSN, NCM-BC, ACM-RN [phone] 10/18/2018, 10:49 AM

## 2018-10-18 NOTE — H&P (Signed)
History and Physical    Emily Gray TKP:[id number] DOB: [birth year] DOA: 10/17/2018  Referring MD/NP/PA:   PCP: Patient, No Pcp Per   Patient coming from:  The patient is coming from home.  At baseline, pt is independent for most of ADL.        Chief Complaint: Generalized weakness, polyuria, polydipsia  HPI: Emily Gray is a 73 y.o. female with medical history significant of hypertension, GERD, breast cancer, obesity, OSA on CPAP, varicose veins in lower extremities, hyperglycemia, who presents with generalized weakness, polyuria, polydipsia.  Patient states that she has been feeling fatigue for about 1 week.  She has generalized weakness, but no unilateral numbness or tingling in extremities.  No facial droop or slurred speech.  She also has polyuria and polydipsia.  Has been checking blood sugar at home with her husband's machine and has been reading high.  Patient has history of hyperglycemia on medical problem list, but denies history of diabetes mellitus.  She is not taking hypoglycemic medications.  Patient states that she has been having mild shortness of breath on exertion recently, which has resolved currently.  Patient denies chest pain, cough, fever or chills.  No tenderness in the calf areas.  No recent long distance traveling.  Patient does not have nausea vomiting, diarrhea or abdominal pain.  No dysuria or burning on urination.  No sick contact. She has chronic varicose vein in lower extremities and mild bilateral leg edema.  ED Course: pt was found to have DKA (bicarbonate of 20, blood sugar 744, anion gap of 18, urinalysis positive for ketone 15), WBC 4.7, BNP 14.5, [place] negative for UTI, negative troponin, lactic acid 1.6, AKI with creatinine 1.17, BUN 41, pseudohyponatremia, temperature normal, tachycardia, oxygen saturation 92 to 98% on room air.  Chest x-ray with mild bronchitic change without infiltration.  Patient is placed on stepdown bed for observation.  Review of  Systems:   General: no fevers, chills, no body weight gain, has fatigue HEENT: no blurry vision, hearing changes or sore throat Respiratory: no dyspnea, coughing, wheezing CV: no chest pain, no palpitations GI: no nausea, vomiting, abdominal pain, diarrhea, constipation GU: no dysuria, burning on urination, has increased urinary frequency, no hematuria  Ext: has leg edema and varicose veins in lower extremities. Neuro: no unilateral weakness, numbness, or tingling, no vision change or hearing loss Skin: no rash, no skin tear. MSK: No muscle spasm, no deformity, no limitation of range of movement in spin Heme: No easy bruising.  Travel history: No recent long distant travel.  Allergy: No Known Allergies  Past Medical History:  Diagnosis Date  . Acid reflux   . Arthritis   . Breast cancer ([place])   . Hypertension   . Obesity, unspecified   . Pure hyperglyceridemia     Past Surgical History:  Procedure Laterality Date  . APPENDECTOMY    . BILATERAL SALPINGOOPHORECTOMY    . CATARACT EXTRACTION Left   . EYE SURGERY Left 07/2008   macro-hole, filled with gel substance at [facility]  . HYSTEROSCOPY W/D&C  12/03/2004  . TONSILLECTOMY    . TUBAL LIGATION Bilateral     Social History:  reports that she has quit smoking. She has never used smokeless tobacco. She reports that she does not drink alcohol or use drugs.  Family History:  Family History  Problem Relation Age of Onset  . Hypertension Mother   . Cancer Father   . Hypertension Father      Prior to Admission medications  Medication Sig Start Date End Date Taking? Authorizing Provider  Ascorbic Acid (VITAMIN C PO) Take by mouth.    [provider]  aspirin 81 MG tablet Take 81 mg by mouth daily.    [provider]  Cholecalciferol (VITAMIN D PO) Take by mouth.    [provider]  dexlansoprazole (DEXILANT) 60 MG capsule Take 60 mg by mouth daily.    [provider]  ibuprofen  (ADVIL,MOTRIN) 800 MG tablet Take 800 mg by mouth every 8 (eight) hours as needed.    [provider]  Iron-Vitamins (GERITOL COMPLETE PO) Take by mouth.    [provider]  lisinopril-hydrochlorothiazide (PRINZIDE,ZESTORETIC) 20-12.5 MG per tablet Take 1 tablet by mouth daily.    [provider]    Physical Exam: Vitals:   10/17/18 2300 10/17/18 2355 10/18/18 0056 10/18/18 0105  BP: 124/64 123/77  108/66  Pulse: 94 (!) 105  (!) 101  Resp: 16 20    Temp:    98.1 F (36.7 C)  TempSrc:    Oral  SpO2: 96% 92%  97%  Weight:   112.2 kg   Height:   5\' 5"  (1.651 m)    General: Not in acute distress.  Dry mucus membrane HEENT:       Eyes: PERRL, EOMI, no scleral icterus.       ENT: No discharge from the ears and nose, no pharynx injection, no tonsillar enlargement.        Neck: No JVD, no bruit, no mass felt. Heme: No neck lymph node enlargement. Cardiac: S1/S2, RRR, No murmurs, No gallops or rubs. Respiratory: No rales, wheezing, rhonchi or rubs. GI: Soft, nondistended, nontender, no rebound pain, no organomegaly, BS present. GU: No hematuria Ext:  2+DP/PT pulse bilaterally. Has trace leg edema and varicose veins in lower extremities. Musculoskeletal: No joint deformities, No joint redness or warmth, no limitation of ROM in spin. Skin: No rashes.  Neuro: Alert, oriented X3, cranial nerves II-XII grossly intact, moves all extremities normally.  Psych: Patient is not psychotic, no suicidal or hemocidal ideation.  Labs on Admission: I have personally reviewed following labs and imaging studies  CBC: Recent Labs  Lab 10/17/18 2108  WBC 4.7  NEUTROABS 2.8  HGB 14.8  HCT 44.3  MCV 86.2  PLT 741   Basic Metabolic Panel: Recent Labs  Lab 10/17/18 2108  NA 120*  K 4.7  CL 82*  CO2 20*  GLUCOSE 744*  BUN 41*  CREATININE 1.17*  CALCIUM 9.7   GFR: Estimated Creatinine Clearance: 55.1 mL/min (A) (by C-G formula based on SCr of 1.17 mg/dL (H)).  Liver Function Tests: Recent Labs  Lab 10/17/18 2108  AST 35  ALT 42  ALKPHOS 180*  BILITOT 0.9  PROT 7.5  ALBUMIN 4.0   No results for input(s): LIPASE, AMYLASE in the last 168 hours. No results for input(s): AMMONIA in the last 168 hours. Coagulation Profile: No results for input(s): INR, PROTIME in the last 168 hours. Cardiac Enzymes: Recent Labs  Lab 10/17/18 2108  TROPONINI <0.03   BNP (last 3 results) No results for input(s): PROBNP in the last 8760 hours. HbA1C: No results for input(s): HGBA1C in the last 72 hours. CBG: Recent Labs  Lab 10/17/18 2247 10/17/18 2353 10/18/18 0102  GLUCAP >600* >600* 406*   Lipid Profile: No results for input(s): CHOL, HDL, LDLCALC, TRIG, CHOLHDL, LDLDIRECT in the last 72 hours. Thyroid Function Tests: No results for input(s): TSH, T4TOTAL, FREET4, T3FREE, THYROIDAB in the  last 72 hours. Anemia Panel: No results for input(s): VITAMINB12, FOLATE, FERRITIN, TIBC, IRON, RETICCTPCT in the last 72 hours. Urine analysis:    Component Value Date/Time   COLORURINE YELLOW 10/17/2018 2340   APPEARANCEUR CLEAR 10/17/2018 2340   LABSPEC 1.010 10/17/2018 2340   PHURINE 5.5 10/17/2018 2340   GLUCOSEU >=500 (A) 10/17/2018 2340   HGBUR TRACE (A) 10/17/2018 2340   BILIRUBINUR NEGATIVE 10/17/2018 2340   KETONESUR 15 (A) 10/17/2018 2340   PROTEINUR NEGATIVE 10/17/2018 2340   NITRITE NEGATIVE 10/17/2018 2340   LEUKOCYTESUR NEGATIVE 10/17/2018 2340   Sepsis Labs: @LABRCNTIP (procalcitonin:4,lacticidven:4) )No results found for this or any previous visit (from the past 240 hour(s)).   Radiological Exams on Admission: Dg Chest 2 View  Result Date: 10/17/2018 CLINICAL DATA:  Shortness of breath since last week, fatigue. History of breast cancer. EXAM: CHEST - 2 VIEW COMPARISON:  None. FINDINGS: Cardiac silhouette is normal in size. Tortuous, potentially ectatic aorta. No pleural effusion or focal consolidation. Mild bronchitic changes. No  pneumothorax. Moderate thoracic spondylosis. Surgical clips project in RIGHT breast. IMPRESSION: Mild bronchitic changes without focal consolidation. Electronically Signed   By: Elon Alas M.D.   On: 10/17/2018 21:47     EKG: Independently reviewed.  Sinus rhythm, QTC 484, low voltage, LAE, nonspecific T wave change.  Assessment/Plan Principal Problem:   DKA (diabetic ketoacidoses) (HCC) Active Problems:   Hypertension   Acid reflux   OSA (obstructive sleep apnea)   AKI (acute kidney injury) ([place])   Dyspnea   Breast cancer (HCC)   Diabetes mellitus without complication ([place])   DKA (diabetic ketoacidoses) (HCC) and newly diagnosed diabetes mellitus without complication: Patient has history of hyperglycemia, but denies history of diabetes.  She is not taking diabetic medications.  Now presents with DKA with anion gap of 18, bicarbonate of 21, positive ketone in urine and elevated blood sugar 744. No A1 on record.  - Place in SDU for obs - will give total of 2L of NS bolus - start DKA protocol with BMP q4h - IVF: NS 100 cc/h; will switch to D5-1/2NS when CBG<250 - replete K as needed - Zofran prn nausea  - check A1c - NPO now - consult to diabetic educator and case manager  AKI: Likely due to prerenal secondary to dehydration and continuation of ACEI, diuretics, NSAIDs - IVF as above - Follow up renal function by BMP - Hold Prinzide and ibuprofen  Hypertension: Blood pressure 123/77 -IV hydralazine PRN -Hold Prinzide temporarily due to AKi  Acid reflux: -Protonix  OSA (obstructive sleep apnea): -CPAP  Dyspnea: Etiology is not clear.  Chest x-ray is negative for infiltration.  Patient states that her dyspnea has resolved.  May be related to DKA.  Patient has mild lower extremity edema, which is likely due to varicose vein and venous insufficiency.  BNP 14.5, less likely to have CHF. -PRN Xopenex inhaler  Hx of  Breast cancer [facility]): s/p of edition therapy, no  surgery.  Patient states that she is taking pills, but does not know the name of pill. -This needs to be verified with her daughter in the morning     DVT ppx: SQ Lovenox Code Status: Full code Family Communication: None at bed side.  Disposition Plan:  Anticipate discharge back to previous home environment Consults called:  none Admission status:  SDU/obs   Date of Service 10/18/2018    [place] [facility]   If 7PM-7AM, please contact night-coverage [email or web address] Password [email or web address] 10/18/2018, 1:55 AM

## 2018-10-18 NOTE — Progress Notes (Signed)
Inpatient Diabetes Program Recommendations  AACE/ADA: New Consensus Statement on Inpatient Glycemic Control (2015)  Target Ranges:  Prepandial:   less than 140 mg/dL      Peak postprandial:   less than 180 mg/dL (1-2 hours)      Critically ill patients:  140 - 180 mg/dL   Lab Results  Component Value Date   OLIDCV 013 (H) 10/18/2018    Review of Glycemic Control  Diabetes history: newly diagnosed DM  Outpatient Diabetes medications: none  Current orders for Inpatient glycemic control: Insulin drip stopped at 1415                                                                          Lantus 10 units daily                                                                          Novolog (0-15 units) tid and (0-5) hs   Admit in DKA and lab glucose 730m/dl. Note new DM diagnosis. Ordered "living well with diabetes" book and insulin pen starter kit for patient. Dietician consult ordered. Will ask bedside RN/NT to have patient practice checking CBG/giving insulin as new diagnosis. CM consult already ordered for assistance in setting up PCP for patient.    Patient was requiring significant amount of insulin on the drip (between 5.4 units/hour to 11 units/hour). Lantus 10 units sq given at 1238 per order two hours before drip discontinued. With the patient's weight and recommendation (Per Phase 2 Adult DKA transition orders) for starting basal insulin is 0.3units/kg. Patient is 112 kg.   (0.3 x 112kg = 33.6). Recommend to increase daily Lantus dose based on size and insulin needs. CBG has increased to 3525mdl at 1452.   Will see patient today regarding new DM diagnosis.     MD please consider the following inpatient insulin adjustments:  As patient has already received 10 units today recommend adding a one time order for Lantus 10 units now THEN start Lantus 20 units Q 24 hours starting tomorrow.   Thank you.  -- Will follow during hospitalization.--  SuJonna ClarkN,  MSN Diabetes Coordinator Inpatient Glycemic Control Team Team Pager: [phone]-5pm)

## 2018-10-19 DIAGNOSIS — Z791 Long term (current) use of non-steroidal anti-inflammatories (NSAID): Secondary | ICD-10-CM | POA: Diagnosis not present

## 2018-10-19 DIAGNOSIS — G4733 Obstructive sleep apnea (adult) (pediatric): Secondary | ICD-10-CM | POA: Diagnosis present

## 2018-10-19 DIAGNOSIS — R739 Hyperglycemia, unspecified: Secondary | ICD-10-CM | POA: Diagnosis not present

## 2018-10-19 DIAGNOSIS — E119 Type 2 diabetes mellitus without complications: Secondary | ICD-10-CM

## 2018-10-19 DIAGNOSIS — Z853 Personal history of malignant neoplasm of breast: Secondary | ICD-10-CM | POA: Diagnosis not present

## 2018-10-19 DIAGNOSIS — Z7982 Long term (current) use of aspirin: Secondary | ICD-10-CM | POA: Diagnosis not present

## 2018-10-19 DIAGNOSIS — E1010 Type 1 diabetes mellitus with ketoacidosis without coma: Secondary | ICD-10-CM | POA: Diagnosis not present

## 2018-10-19 DIAGNOSIS — N179 Acute kidney failure, unspecified: Secondary | ICD-10-CM | POA: Diagnosis present

## 2018-10-19 DIAGNOSIS — R0600 Dyspnea, unspecified: Secondary | ICD-10-CM | POA: Diagnosis not present

## 2018-10-19 DIAGNOSIS — Z79899 Other long term (current) drug therapy: Secondary | ICD-10-CM | POA: Diagnosis not present

## 2018-10-19 DIAGNOSIS — Z6841 Body Mass Index (BMI) 40.0 and over, adult: Secondary | ICD-10-CM | POA: Diagnosis not present

## 2018-10-19 DIAGNOSIS — I10 Essential (primary) hypertension: Secondary | ICD-10-CM | POA: Diagnosis present

## 2018-10-19 DIAGNOSIS — K219 Gastro-esophageal reflux disease without esophagitis: Secondary | ICD-10-CM | POA: Diagnosis present

## 2018-10-19 DIAGNOSIS — Z8249 Family history of ischemic heart disease and other diseases of the circulatory system: Secondary | ICD-10-CM | POA: Diagnosis not present

## 2018-10-19 DIAGNOSIS — E860 Dehydration: Secondary | ICD-10-CM | POA: Diagnosis present

## 2018-10-19 DIAGNOSIS — Z23 Encounter for immunization: Secondary | ICD-10-CM | POA: Diagnosis not present

## 2018-10-19 DIAGNOSIS — Z809 Family history of malignant neoplasm, unspecified: Secondary | ICD-10-CM | POA: Diagnosis not present

## 2018-10-19 DIAGNOSIS — E1110 Type 2 diabetes mellitus with ketoacidosis without coma: Secondary | ICD-10-CM | POA: Diagnosis present

## 2018-10-19 DIAGNOSIS — C50919 Malignant neoplasm of unspecified site of unspecified female breast: Secondary | ICD-10-CM | POA: Diagnosis not present

## 2018-10-19 DIAGNOSIS — I8390 Asymptomatic varicose veins of unspecified lower extremity: Secondary | ICD-10-CM | POA: Diagnosis present

## 2018-10-19 LAB — GLUCOSE, CAPILLARY
Glucose-Capillary: 348 mg/dL — ABNORMAL HIGH (ref 70–99)
Glucose-Capillary: 317 mg/dL — ABNORMAL HIGH (ref 70–99)
Glucose-Capillary: 355 mg/dL — ABNORMAL HIGH (ref 70–99)
Glucose-Capillary: 319 mg/dL — ABNORMAL HIGH (ref 70–99)

## 2018-10-19 MED ORDER — INSULIN GLARGINE 100 UNIT/ML ~~LOC~~ SOLN
13.0000 [IU] | Freq: Once | SUBCUTANEOUS | Status: AC
  Administered 2018-10-19: 13 [IU] via SUBCUTANEOUS
  Filled 2018-10-19: qty 0.13

## 2018-10-19 MED ORDER — HYDROCHLOROTHIAZIDE 12.5 MG PO CAPS
12.5000 mg | ORAL_CAPSULE | Freq: Every day | ORAL | Status: DC
  Administered 2018-10-19 – 2018-10-20 (×2): 12.5 mg via ORAL
  Filled 2018-10-19 (×2): qty 1

## 2018-10-19 MED ORDER — LISINOPRIL-HYDROCHLOROTHIAZIDE 20-12.5 MG PO TABS: 1.0000 | ORAL_TABLET | Freq: Every day | ORAL | Status: DC

## 2018-10-19 MED ORDER — INSULIN ASPART 100 UNIT/ML ~~LOC~~ SOLN
4.0000 [IU] | Freq: Three times a day (TID) | SUBCUTANEOUS | Status: DC
  Administered 2018-10-19 – 2018-10-20 (×4): 4 [IU] via SUBCUTANEOUS

## 2018-10-19 MED ORDER — INSULIN GLARGINE 100 UNIT/ML ~~LOC~~ SOLN: 20.0000 [IU] | Freq: Every day | SUBCUTANEOUS | Status: DC

## 2018-10-19 MED ORDER — LISINOPRIL 20 MG PO TABS
20.0000 mg | ORAL_TABLET | Freq: Every day | ORAL | Status: DC
  Administered 2018-10-19 – 2018-10-20 (×2): 20 mg via ORAL
  Filled 2018-10-19 (×2): qty 1

## 2018-10-19 MED ORDER — LETROZOLE 2.5 MG PO TABS
2.5000 mg | ORAL_TABLET | Freq: Every day | ORAL | Status: DC
  Administered 2018-10-19 – 2018-10-20 (×2): 2.5 mg via ORAL
  Filled 2018-10-19 (×3): qty 1

## 2018-10-19 NOTE — Progress Notes (Signed)
Placed patient on CPAP set at 4cm H20 for the night.

## 2018-10-19 NOTE — Progress Notes (Signed)
Inpatient Diabetes Program Recommendations  AACE/ADA: New Consensus Statement on Inpatient Glycemic Control  Target Ranges:  Prepandial:   less than 140 mg/dL      Peak postprandial:   less than 180 mg/dL (1-2 hours)      Critically ill patients:  140 - 180 mg/dL   Results for KAELIN, HOLFORD (MRN [id number]) as of 10/19/2018 10:01  Ref. Range 10/18/2018 10:09 10/18/2018 11:12 10/18/2018 12:11 10/18/2018 13:02 10/18/2018 13:31 10/18/2018 14:52 10/18/2018 17:19 10/18/2018 21:30 10/19/2018 07:44  Glucose-Capillary Latest Ref Range: 70 - 99 mg/dL 136 (H) 125 (H) 138 (H) 216 (H) 257 (H) 352 (H) 363 (H) 397 (H) 348 (H)   Review of Glycemic Control  Current orders for Inpatient glycemic control: Lantus 10 units daily, Novolog 0-15 units TID with meals, Novolog 0-5 units QHS  Inpatient Diabetes Program Recommendations:   Insulin - Basal: Please consider increasing Lantus to 23 units daily (starting 10/20/18). Patient has already received Lantus 10 units this morning so please also order a one time additional Lantus 13 units x1 now (for total of 23 units today).  Insulin - Meal Coverage: Please consider ordering Novolog 4 units TID with meals for meal coverage if patient eats at least 50% of meals.  Thanks, Barnie Alderman, RN, MSN, CDE Diabetes Coordinator Inpatient Diabetes Program [phone] (Team Pager from 8am to 5pm)

## 2018-10-19 NOTE — Progress Notes (Signed)
PROGRESS NOTE  Emily Gray [id number]:[id number] DOB: 1945-12-17 DOA: 10/17/2018 PCP: Patient, No Pcp Per  Brief History   The patient is a 73 yr old woman who was admitted on 10/17/2018 for DKA. She has newly diagnosed DM II. The patient was able to be removed from the glucose stabilizer on 10/18/2018. She has been placed on subcutaneous Lantus, correction novolog and prandial novolog. She will meet with the Diabetic educator today.  Consultants  . None  Procedures  . None  Antibiotics  . None  Interval History/Subjective  See above.  The patient is resting quietly in bed. No new complaints. Objective   Vitals:  Vitals:   10/19/18 1150 10/19/18 1628  BP: 121/73 140/84  Pulse: 85 90  Resp:    Temp: 98.2 F (36.8 C) 98.4 F (36.9 C)  SpO2: 96% 98%    Exam:  Constitutional:  The patient is awake, alert, and oriented x 3. No acute distress.  Respiratory:  . No increased work of breathing.  . No wheezes,rales, or rhonchi  . No tactile fremitus Cardiovascular:  . Regular rate and rhythm. . No murmurs, ectopy, or gallups. . No LE extremity edema   . Normal pedal pulses Abdomen:  . Abdomen soft, non-tender, non-distended. . Normoactive bowel sounds. . No hernias, masses, or organomegaly is appreciated. Musculoskeletal:  . No cyanosis, clubbing, or edema. Skin:  . No rashes, lesions, ulcers . palpation of skin: no induration or nodules Neurologic:  . CN 2-12 intact . Sensation all 4 extremities intact Psychiatric:  . Mental status o Mood, affect appropriate o Orientation to person, place, time  . judgment and insight appear intact    I have personally reviewed the following:   Today's Data  . Glucoses, Hemoglobin A1c.  Scheduled Meds: . aspirin EC  81 mg Oral Daily  . cholecalciferol  1,000 Units Oral Daily  . enoxaparin (LOVENOX) injection  40 mg Subcutaneous Daily  . insulin aspart  0-15 Units Subcutaneous TID WC  . insulin aspart  0-5 Units Subcutaneous  QHS  . insulin aspart  4 Units Subcutaneous TID WC  . insulin starter kit- pen needles  1 kit Other Once  . multivitamin with minerals  1 tablet Oral Daily  . pantoprazole  40 mg Oral Daily  . vitamin C  250 mg Oral Daily   Continuous Infusions:  Principal Problem:   DKA (diabetic ketoacidoses) (HCC) Active Problems:   Hypertension   Acid reflux   OSA (obstructive sleep apnea)   AKI (acute kidney injury) ([place])   Dyspnea   Breast cancer (HCC)   Diabetes mellitus without complication ([place])   A & P  DKA: Resolved, but patient's glucoses remain very high. Lantus has been increased and prandial Novolog has been added.   DM II: New diagnosis. HbA1c is 13.1. The patient met with diabetic educator today. Their assistance is greatly appreciated. She is on a carb controlled diet and a hypoglycemic protocol.  Hypertension: Fair control. The patient will be restarted on her lisinopril and HCTZ.  AKI: Resolved with hydration  GERD: Protonix  Dyspnea: Resolved. Likely due to acidosis.  Hx of BRCA: Pt is restarted on Femara.  I have seen and examined this patient myself. I have spent 36 minutes in her evaluation and care.  DVT prophylaxis: Lovenox Code Status: Full Code Family Communication: None present Disposition Plan: Home  Donnavan Covault, DO [facility] Direct contact: see [email or web address]  7PM-7AM contact night coverage as above 10/19/2018, 6:02 PM  LOS: 0  days    LOS: 0 days

## 2018-10-20 DIAGNOSIS — E669 Obesity, unspecified: Secondary | ICD-10-CM

## 2018-10-20 DIAGNOSIS — E1110 Type 2 diabetes mellitus with ketoacidosis without coma: Principal | ICD-10-CM

## 2018-10-20 DIAGNOSIS — E119 Type 2 diabetes mellitus without complications: Secondary | ICD-10-CM

## 2018-10-20 LAB — BASIC METABOLIC PANEL
Anion gap: 8 (ref 5–15)
BUN: 19 mg/dL (ref 8–23)
CO2: 23 mmol/L (ref 22–32)
Calcium: 8.9 mg/dL (ref 8.9–10.3)
Chloride: 99 mmol/L (ref 98–111)
Creatinine, Ser: 0.83 mg/dL (ref 0.44–1.00)
GFR calc Af Amer: >60 mL/min (ref >60)
GFR calc non Af Amer: >60 mL/min (ref >60)
Glucose, Bld: 330 mg/dL — ABNORMAL HIGH (ref 70–99)
Potassium: 3.7 mmol/L (ref 3.5–5.1)
Sodium: 130 mmol/L — ABNORMAL LOW (ref 135–145)

## 2018-10-20 LAB — CBC WITH DIFFERENTIAL/PLATELET
Abs Immature Granulocytes: 0.03 10*3/uL (ref 0.00–0.07)
Basophils Absolute: 0 10*3/uL (ref 0.0–0.1)
Basophils Relative: 1 %
Eosinophils Absolute: 0.1 10*3/uL (ref 0.0–0.5)
Eosinophils Relative: 3 %
HCT: 37 % (ref 36.0–46.0)
Hemoglobin: 12.3 g/dL (ref 12.0–15.0)
Immature Granulocytes: 1 %
Lymphocytes Relative: 33 %
Lymphs Abs: 1.3 10*3/uL (ref 0.7–4.0)
MCH: 28.3 pg (ref 26.0–34.0)
MCHC: 33.2 g/dL (ref 30.0–36.0)
MCV: 85.1 fL (ref 80.0–100.0)
Monocytes Absolute: 0.5 10*3/uL (ref 0.1–1.0)
Monocytes Relative: 12 %
Neutro Abs: 2.1 10*3/uL (ref 1.7–7.7)
Neutrophils Relative %: 50 %
Platelets: 201 10*3/uL (ref 150–400)
RBC: 4.35 MIL/uL (ref 3.87–5.11)
RDW: 12.6 % (ref 11.5–15.5)
WBC: 4.1 10*3/uL (ref 4.0–10.5)
nRBC: 0 % (ref 0.0–0.2)

## 2018-10-20 LAB — GLUCOSE, CAPILLARY
Glucose-Capillary: 379 mg/dL — ABNORMAL HIGH (ref 70–99)
Glucose-Capillary: 303 mg/dL — ABNORMAL HIGH (ref 70–99)
Glucose-Capillary: 282 mg/dL — ABNORMAL HIGH (ref 70–99)
Glucose-Capillary: 335 mg/dL — ABNORMAL HIGH (ref 70–99)

## 2018-10-20 MED ORDER — ACETAMINOPHEN 325 MG PO TABS: 650.0000 mg | ORAL_TABLET | Freq: Four times a day (QID) | ORAL | Status: DC | PRN

## 2018-10-20 MED ORDER — INSULIN STARTER KIT- PEN NEEDLES (ENGLISH): 1.0000 | Freq: Once | 0 refills | Status: AC

## 2018-10-20 MED ORDER — ADULT MULTIVITAMIN W/MINERALS CH: 1.0000 | ORAL_TABLET | Freq: Every day | ORAL | Status: DC

## 2018-10-20 MED ORDER — BLOOD GLUCOSE MONITOR KIT: PACK | 0 refills | Status: DC

## 2018-10-20 MED ORDER — INSULIN ASPART 100 UNIT/ML FLEXPEN: 0.0000 [IU] | PEN_INJECTOR | Freq: Every day | SUBCUTANEOUS | 11 refills | Status: DC

## 2018-10-20 MED ORDER — INSULIN PEN NEEDLE 33G X 4 MM MISC: 1.0000 | Freq: Three times a day (TID) | 2 refills | Status: DC

## 2018-10-20 MED ORDER — INSULIN ASPART 100 UNIT/ML FLEXPEN: 1.0000 [IU] | PEN_INJECTOR | Freq: Three times a day (TID) | SUBCUTANEOUS | 11 refills | Status: DC

## 2018-10-20 MED ORDER — LISINOPRIL-HYDROCHLOROTHIAZIDE 20-12.5 MG PO TABS: 1.0000 | ORAL_TABLET | Freq: Every day | ORAL | 0 refills | Status: DC

## 2018-10-20 MED ORDER — INSULIN GLARGINE 100 UNIT/ML SOLOSTAR PEN: 22.0000 [IU] | PEN_INJECTOR | Freq: Every day | SUBCUTANEOUS | 11 refills | Status: DC

## 2018-10-20 MED ORDER — INSULIN GLARGINE 100 UNIT/ML ~~LOC~~ SOLN
22.0000 [IU] | Freq: Every day | SUBCUTANEOUS | Status: DC
  Administered 2018-10-20: 22 [IU] via SUBCUTANEOUS
  Filled 2018-10-20 (×2): qty 0.22

## 2018-10-20 MED ORDER — VITAMIN D 25 MCG (1000 UNIT) PO TABS: 1000.0000 [IU] | ORAL_TABLET | Freq: Every day | ORAL | 0 refills | Status: AC

## 2018-10-20 MED ORDER — INSULIN ASPART 100 UNIT/ML ~~LOC~~ SOLN: 4.0000 [IU] | Freq: Three times a day (TID) | SUBCUTANEOUS | 11 refills | Status: DC

## 2018-10-20 MED FILL — VITAMIN D3 1,000 UNIT TAB: 25 MCG | 30 days supply | Qty: 30 | Fill #0

## 2018-10-20 MED FILL — PENTIPS 32G X 4 MM MISC: 32G X 4 MM | 25 days supply | Qty: 100 | Fill #0 | Status: TO

## 2018-10-20 MED FILL — LANTUS SOLOSTAR 100 UNITS/M: 100 | 30 days supply | Qty: 9 | Fill #0 | Status: TO

## 2018-10-20 MED FILL — NOVOLOG FLEXPEN SYRINGE: 100 | 25 days supply | Qty: 3 | Fill #0 | Status: TO

## 2018-10-20 MED FILL — LISINOPRIL-HCTZ 20-12.5 MG: 20-12.5 | 30 days supply | Qty: 30 | Fill #0

## 2018-10-20 NOTE — Discharge Summary (Signed)
Physician Discharge Summary  Emily Gray DOB: June 11, 1946 DOA: 10/17/2018  PCP: Patient, No Pcp Per  Admit date: 10/17/2018 Discharge date: 10/20/2018  Admitted From: home Disposition:  home   Recommendations for Outpatient Follow-up:  1. Needs A1c in 1 month 2. Daughter is aware that she needs a PCP  Home Health:  ordered Discharge Condition:  stable   CODE STATUS:  Full code   Diet recommendation:  Carb modified, heart healthy Consultations:  none    Discharge Diagnoses:  Principal Problem:   DKA (diabetic ketoacidoses) ([place]) Active Problems:   DM (diabetes mellitus), type 2 ([place])   OSA (obstructive sleep apnea)   AKI (acute kidney injury) ([place])   Breast cancer ([place])   Obesity   Brief Summary: Emily Gray is a 73 y.o. female with medical history significant of hypertension, GERD, breast cancer, obesity, OSA on CPAP, varicose veins in lower extremities, hyperglycemia, who presents with generalized weakness, polyuria, polydipsia. According to the history, the patient has been fatigued for about a week and has noted polyuria and polydipsia.  Her husband had diabetes and when checking her sugars she noted that the meter was reading high and therefore came into the ED.  Further history was obtained from the patient's daughter on the day of discharge and it appears that the patient has been aware that she is a diabetic for the past 3 years but was never started on medication.  She was told to change her lifestyle and follow-up to see if medication was needed but she never did.  She moved from [place] and she has been living with her daughter for 1 year now has not found a local PCP either.   Hospital Course:  Principal Problem:   DKA (diabetic ketoacidoses) / DM (diabetes mellitus), type 2    -Resolved with insulin infusion - A1c is 13.1 -Patient has been educated in regards to dietary and lifestyle modifications - She has received insulin teaching and will be  discharged with Lantus and NovoLog - In speaking with the patient's daughter with whom the patient is living, I have discovered that patient appeared to be in denial in regards to her diabetes for the past few years-her husband who is a diabetic is also using the same insulin -Patient is in agreement with adhering to a diabetic diet, checking blood sugars and taking medications appropriately  Active Problems:     OSA (obstructive sleep apnea) -She uses a CPAP    AKI (acute kidney injury) ([place]) -Prerenal-resolved with IV hydration    Breast cancer ([place]) -Continue Femara    Obesity Body mass index is 41.95 kg/m. -Advised to lose weight with diet and exercise    Discharge Exam: Vitals:   10/20/18 0447 10/20/18 1006  BP: 113/64 (!) 104/57  Pulse: 71   Resp: 20   Temp: 98 F (36.7 C)   SpO2: 95%    Vitals:   10/20/18 0120 10/20/18 0447 10/20/18 0449 10/20/18 1006  BP: 124/78 113/64  (!) 104/57  Pulse: 82 71    Resp:  20    Temp:  98 F (36.7 C)    TempSrc:  Oral    SpO2:  95%    Weight:   114.4 kg   Height:        General: Pt is alert, awake, not in acute distress Cardiovascular: RRR, S1/S2 +, no rubs, no gallops Respiratory: CTA bilaterally, no wheezing, no rhonchi Abdominal: Soft, NT, ND, bowel sounds + Extremities: no edema, no cyanosis  Discharge Instructions  Discharge Instructions    Diet - low sodium heart healthy   Complete by:  As directed    Diet Carb Modified   Complete by:  As directed    Increase activity slowly   Complete by:  As directed      Allergies as of 10/20/2018   No Known Allergies     Medication List    STOP taking these medications   GERITOL COMPLETE PO Replaced by:  multivitamin with minerals Tabs tablet     TAKE these medications   acetaminophen 325 MG tablet Commonly known as:  TYLENOL Take 2 tablets (650 mg total) by mouth every 6 (six) hours as needed for fever, headache or moderate pain.   alendronate 70 MG  tablet Commonly known as:  FOSAMAX Take 70 mg by mouth once a week.   aspirin 81 MG tablet Take 81 mg by mouth daily.   blood glucose meter kit and supplies Kit Dispense based on patient and insurance preference. Use up to four times daily as directed. (FOR ICD-9 250.00, 250.01).   cholecalciferol 25 MCG (1000 UT) tablet Commonly known as:  VITAMIN D3 Take 1 tablet (1,000 Units total) by mouth daily. What changed:    medication strength  how much to take  when to take this   ibuprofen 800 MG tablet Commonly known as:  ADVIL,MOTRIN Take 800 mg by mouth every 8 (eight) hours as needed for cramping.   insulin aspart 100 UNIT/ML FlexPen Commonly known as:  NovoLOG FlexPen Inject 1-15 Units into the skin 3 (three) times daily with meals. CBG 121 - 150: 2 units  CBG 151 - 200: 3 units  CBG 201 - 250: 5 units  CBG 251 - 300: 8 units  CBG 301 - 350: 11 units  CBG 351 - 400: 15 units   insulin aspart 100 UNIT/ML FlexPen Commonly known as:  NovoLOG FlexPen Inject 0-5 Units into the skin at bedtime. CBG 151 - 200: 0 units  CBG 201 - 250: 2 units  CBG 251 - 300: 3 units  CBG 301 - 350: 4 units  CBG 351 - 400: 5 units   Insulin Glargine 100 UNIT/ML Solostar Pen Commonly known as:  Lantus SoloStar Inject 22 Units into the skin daily.   Insulin Pen Needle 33G X 4 MM Misc Commonly known as:  Advocate Insulin Pen Needles 1 each by Does not apply route 4 (four) times daily -  before meals and at bedtime.   insulin starter kit- pen needles Misc 1 kit by Other route once for 1 dose.   latanoprost 0.005 % ophthalmic solution Commonly known as:  XALATAN Place 1 drop into both eyes at bedtime.   letrozole 2.5 MG tablet Commonly known as:  FEMARA Take 2.5 mg by mouth daily.   lisinopril-hydrochlorothiazide 20-12.5 MG tablet Commonly known as:  PRINZIDE,ZESTORETIC Take 1 tablet by mouth daily.   multivitamin with minerals Tabs tablet Take 1 tablet by mouth daily. Replaces:   GERITOL COMPLETE PO      Follow-up Information    Primary Care at [facility]. Go on 11/17/2018.   Specialty:  Family Medicine Why:  at 9:30am for your new patient appointment Contact information: [address] [place], [facility] Follow up.   Specialty:  Home Health Services Why:  Home Health Registered Nurse for Diabetic Education Contact information: [address] [place] [address]  [phone]          No Known Allergies   Procedures/Studies:   Dg Chest 2 View  Result Date: 10/17/2018 CLINICAL DATA:  Shortness of breath since last week, fatigue. History of breast cancer. EXAM: CHEST - 2 VIEW COMPARISON:  None. FINDINGS: Cardiac silhouette is normal in size. Tortuous, potentially ectatic aorta. No pleural effusion or focal consolidation. Mild bronchitic changes. No pneumothorax. Moderate thoracic spondylosis. Surgical clips project in RIGHT breast. IMPRESSION: Mild bronchitic changes without focal consolidation. Electronically Signed   By: Elon Alas M.D.   On: 10/17/2018 21:47     The results of significant diagnostics from this hospitalization (including imaging, microbiology, ancillary and laboratory) are listed below for reference.     Microbiology: No results found for this or any previous visit (from the past 240 hour(s)).   Labs: BNP (last 3 results) Recent Labs    10/17/18 2108  BNP 70.6   Basic Metabolic Panel: Recent Labs  Lab 10/17/18 2108 10/18/18 0407 10/18/18 0933 10/20/18 0433  NA 120* 134* 136 130*  K 4.7 3.8 3.4* 3.7  CL 82* 97* 101 99  CO2 20* _0 GLUCOSE 744* 249* 150* 330*  BUN 41* 32* 30* 19  CREATININE 1.17* 0.99 0.79 0.83  CALCIUM 9.7 9.4 9.3 8.9  MG  --   --  2.1  --   PHOS  --   --  2.6  --    Liver Function Tests: Recent Labs  Lab 10/17/18 2108 10/18/18 0933  AST 35 34  ALT 42 35  ALKPHOS 180* 96  BILITOT 0.9  0.8  PROT 7.5 6.7  ALBUMIN 4.0 3.4*   No results for input(s): LIPASE, AMYLASE in the last 168 hours. No results for input(s): AMMONIA in the last 168 hours. CBC: Recent Labs  Lab 10/17/18 2108 10/18/18 0933 10/20/18 0433  WBC 4.7 4.2 4.1  NEUTROABS 2.8 2.3 2.1  HGB 14.8 13.7 12.3  HCT 44.3 40.3 37.0  MCV 86.2 84.0 85.1  PLT 277 233 201   Cardiac Enzymes: Recent Labs  Lab 10/17/18 2108  TROPONINI <0.03   BNP: Invalid input(s): POCBNP CBG: Recent Labs  Lab 10/19/18 0744 10/19/18 1056 10/19/18 1632 10/19/18 2156 10/20/18 0736  GLUCAP 348* 317* 355* 319* 379*   D-Dimer No results for input(s): DDIMER in the last 72 hours. Hgb A1c Recent Labs    10/18/18 0349  HGBA1C 13.1*   Lipid Profile No results for input(s): CHOL, HDL, LDLCALC, TRIG, CHOLHDL, LDLDIRECT in the last 72 hours. Thyroid function studies No results for input(s): TSH, T4TOTAL, T3FREE, THYROIDAB in the last 72 hours.  Invalid input(s): FREET3 Anemia work up No results for input(s): VITAMINB12, FOLATE, FERRITIN, TIBC, IRON, RETICCTPCT in the last 72 hours. Urinalysis    Component Value Date/Time   COLORURINE YELLOW 10/18/2018 1440   APPEARANCEUR HAZY (A) 10/18/2018 1440   LABSPEC 1.023 10/18/2018 1440   PHURINE 5.0 10/18/2018 1440   GLUCOSEU >=500 (A) 10/18/2018 1440   HGBUR NEGATIVE 10/18/2018 1440   BILIRUBINUR NEGATIVE 10/18/2018 1440   KETONESUR 5 (A) 10/18/2018 1440   PROTEINUR NEGATIVE 10/18/2018 1440   NITRITE NEGATIVE 10/18/2018 1440   LEUKOCYTESUR NEGATIVE 10/18/2018 1440   Sepsis Labs Invalid input(s): PROCALCITONIN,  WBC,  LACTICIDVEN Microbiology No results found for this or any previous visit (from the past 240 hour(s)).   Time coordinating discharge in minutes: 65  SIGNED:   Debbe Odea, MD  [facility] 10/20/2018, 10:18 AM Pager   If  7PM-7AM, please contact night-coverage [email or web address] Password [email or web address]

## 2018-10-20 NOTE — Progress Notes (Signed)
Discharge Order received.  Dr Wynelle Cleveland stated to DC patient after 4 PM today.  Discharge instructions reviewed with and given to patient.  Patient's daughter Sharyn Lull present via Facetime.  Patient and Daughter shown how to use insulin pen.  Information sheet about insulin pen usage provided from practice kit.  Patient's daughter stated that she is a CNA and know how to check CBGs.  Explanation given about short acting insulin/sliding scale/meal coverage as well as long acting insulin usage.  Dr Wynelle Cleveland stated she would follow up with the patient in the morning.  This was relayed to patient and daughter.  Daughter Sharyn Lull stated that she would pick up meter, lancets, strips and alcohol swabs from [employer].  Patient given sliding scale insulin prior to discharge, she stated she would take 4 units meal coverage once she gets ready to eat at home.

## 2018-10-20 NOTE — Progress Notes (Signed)
Inpatient Diabetes Program Recommendations  AACE/ADA: New Consensus Statement on Inpatient Glycemic Control (2015)  Target Ranges:  Prepandial:   less than 140 mg/dL      Peak postprandial:   less than 180 mg/dL (1-2 hours)      Critically ill patients:  140 - 180 mg/dL   Lab Results  Component Value Date   GLUCAP 303 (H) 10/20/2018   HGBA1C 13.1 (H) 10/18/2018    Spoke with patient prior to discharge today and reviewed discharge medications with patient. Patient verbalizes that she has a better understanding of her diabetes, now "I just gotta do it." Daughter, who lives with patient, will be the one performing injections and preparing meals. Patient states that daughter is at [facility] getting patient a meter and other supplies. Reviewed survival skills, encouraged follow up with PCP and reviewed when to call Md. Patient appreciative and had no further questions.   Thanks, Bronson Curb, MSN, RNC-OB Diabetes Coordinator [phone] ([phone]-5p)

## 2018-10-20 NOTE — Discharge Instructions (Signed)
Heart-Healthy Consistent Carbohydrate Nutrition Therapy A heart-healthy and consistent carbohydrate diet is recommended to manage heart disease and diabetes. To follow a heart-healthy and consistent carbohydrate diet, eat a balanced diet with whole grains, fruits and vegetables, and lean protein sources. Choose heart-healthy unsaturated fats. Limit saturated fats, trans fats, and cholesterol intake. Eat more plant-based or vegetarian meals using beans and soy foods for protein. Eat whole, unprocessed foods to limit the amount of sodium (salt) you eat. Choose a consistent amount of carbohydrate at each meal and snack. Limit refined carbohydrates especially sugar, sweets and sugar-sweetened beverages. If you drink alcohol, do so in moderation: one serving per day (women) and two servings per day (men). One serving is equivalent to 12 ounces beer, 5 ounces wine, or 1.5 ounces distilled spirits.   Tips for Choosing Heart-Healthy Fats  Choose lean protein and low-fat dairy foods to reduce saturated fat intake. Saturated fat is usually found in animal-based protein and is associated with certain health risks. Saturated fat is the biggest contributor to raise low-density lipoprotein (LDL) cholesterol levels. Research shows that limiting saturated fat lowers unhealthy cholesterol levels. Eat no more than 7% of your total calories each day from saturated fat. There are many foods that do not contain large amounts of saturated fats. Swapping these foods to replace foods high in saturated fats will help you limit the saturated fat you eat and improve your cholesterol levels. You can also try eating more plant-based or vegetarian meals.  Avoid foods that contain trans fats. Trans fats increase levels of LDL-cholesterol. Hydrogenated fat in processed foods is the main source of trans fats in foods. Trans fats can be found in stick margarine, shortening, processed sweets, baked goods, some fried foods, and packaged  foods made with hydrogenated oils. Avoid foods with partially hydrogenated oil on the ingredient list such as: cookies, pastries, baked goods, biscuits, crackers, microwave popcorn, and frozen dinners.  Choose foods with heart healthy fats. Polyunsaturated and monounsaturated fat are unsaturated fats that may help lower your blood cholesterol level when used in place of saturated fat in your diet. Research shows that substituting saturated fats with unsaturated fats is beneficial to cholesterol levels.  Limit the amount of cholesterol you eat to less than 200 milligrams per day. Cholesterol is a substance carried through the bloodstream via lipoproteins, which are known as transporters of fat. Some body functions need cholesterol to work properly, but too much cholesterol in the bloodstream can damage arteries and build up blood vessel linings (which can lead to heart attack and stroke). You should eat less than 200 milligrams cholesterol per day. People respond differently to eating cholesterol. There is no test available right now that can figure out which people will respond more to dietary cholesterol and which will respond less. For individuals with high intake of dietary cholesterol, different types of increase (none, small, moderate, large) in LDL-cholesterol levels are all possible. Food sources of cholesterol include egg yolks and organ meats such as liver, gizzards. Limit egg yolks to two to four per week and avoid organ meats like liver and gizzards to control cholesterol intake.   Consume a consistent amount of carbohydrate It is important to eat foods with carbohydrates in moderation because they impact your blood glucose level. Carbohydrates can be found in many foods such as: Grains (breads, crackers, rice, pasta, and cereals) Starchy Vegetables (potatoes, corn, and peas) Beans and legumes Milk, soy milk, and yogurt Fruit and fruit juice Sweets (cakes, cookies, ice cream, jam  and jelly)  For many adults, eating 3 to 5 servings of carbohydrate foods at each meal and 1 or 2 carbohydrate servings for each snack works well.  Check your blood glucose level regularly. It can tell you if you need to adjust when you eat carbohydrates.  Choose foods rich in viscous (soluble) fiber Viscous, or soluble, is found in the walls of plant cells. Viscous fiber is found only in plant-based foods. Eating foods with fiber helps to lower your unhealthy cholesterol and keep your blood glucose in range. Rich sources of viscous fiber include vegetables (asparagus, Brussels sprouts, sweet potatoes, turnips) fruit (apricots, mangoes, oranges), legumes, and whole grains (barley, oats, and oat bran). As you increase your fiber intake gradually, also increase the amount of water you drink. This will help prevent constipation.  Limit refined carbohydrates There are three types of carbohydrates: starches, sugar, and fiber. Some carbohydrates occur naturally in food, like the starches in rice or corn or the sugars in fruits and milk. Refined carbohydrates--foods with high amounts of simple sugars--can raise triglyceride levels. High triglyceride levels are associated with coronary heart disease. Some examples of refined carbohydrate foods are table sugar, sweets, and beverages sweetened with added sugar.  Tips for Reducing Sodium (Salt) Although sodium is important for your body to function, too much sodium can be harmful for people with high blood pressure. As sodium and fluid buildup in your tissues and bloodstream, your blood pressure increases. High blood pressure may cause damage to other organs and increase your risk for a stroke. Even if you take a pill for blood pressure or a water pill (diuretic) to remove fluid, it is still important to have less salt in your diet. Avoid processed foods.   Eat more fresh foods. Fresh fruits and vegetables are naturally low in sodium, as well as frozen  vegetables and fruits that have no added juices or sauces. Fresh meats are lower in sodium than processed meats, such as bacon, sausage, and hotdogs. Read the nutrition label or ask your butcher to help you find a fresh meat that is low in sodium.  Eat less salt--at the table and when cooking. A single teaspoon of table salt has 2,300 mg of sodium. Leave the salt out of recipes for pasta, casseroles, and soups.  Be a Paramedic. Look for food packages that say salt-free or sodium-free. These items contain less than 5 milligrams of sodium per serving. Very low-sodium products contain less than 35 milligrams of sodium per serving. Low-sodium products contain less than 140 milligrams of sodium per serving. Beware for Unsalted or No Added Salt products. These items may still be high in sodium. Check the nutrition label. Add flavors to your food without adding sodium. Try lemon juice, lime juice, fruit juice or vinegar. Dry or fresh herbs add flavor. Try basil, bay leaf, dill, rosemary, parsley, sage, dry mustard, nutmeg, thyme, and paprika. Pepper, red pepper flakes, and cayenne pepper can add spice t your meals without adding sodium. Hot sauce contains sodium, but if you use just a drop or two, it will not add up to much. Buy a sodium-free seasoning blend or make your own at home.  Additional Lifestyle Tips  Achieve and maintain a healthy weight. To lose weight, reduce your calorie intake along with increasing your physical activity. A weight loss of 10 to 15 pounds could reduce LDL-cholesterol by 5 milligrams per deciliter.  Participate in physical activity. Talk with your health care team to find out what types  of physical activity are best for you. Set a plan to get about 30 minutes of exercise on most days.  Foods Recommended  Grains Whole grain breads and cereals, including whole wheat, barley, rye, buckwheat, corn, teff, quinoa, millet, amaranth, [place] or wild rice,  sorghum, and oats Pasta, especially whole wheat or other whole grain types [employer], quinoa or wild rice Whole grain crackers, bread, r olls, pitas Home-made bread with reduced-sodium baking soda  Protein Foods Lean cuts of beef and pork (loin, leg, round, extra lean hamburger) Skinless Cytogeneticist and other wild game Dried beans and peas Nuts and nut butters Meat alternatives mad e with soy or textured vegetable protein Egg whites or egg substitute Cold cuts made with lean me at or soy protein  Dairy Nonfat (skim), low-fat, or 1%-fat milk Nonfat or low-fat yogurt or cottage ch eese Fat-free and low-fat cheese  Vegetables  Fresh, frozen, or canned vegetables without added fat or salt Fruits Fresh, frozen, canned, or dried fruit  Oils Unsaturated oils (corn, olive, peanut, soy, sunflower, canola) Soft or liquid margarines and vegetable oil spreads Salad dressings Seeds and nuts Avocado   Foods Not Recommended  Grains Breads or crackers topped with salt Cereals (hot or cold) with more than 300 mg sodium per serving Biscuits, cornbread, and other quick breads prepared with bak ing soda Bread crumbs or stuffing mix from a store High-fat bakery products, such as doughn uts, biscuits, croissants, danish pastries, pies, cookies Instant cooking foods to which you add hot water and stir--potatoes, noodles, rice, etc. Packaged starchy foods--seasoned noodle or rice dishes, stuffing mix, macaroni and che ese dinner Snacks made with partially hydrogenated oils, including chips, cheese puffs, snack mixes, regular c rackers, butter-flavored popcorn  Protein Foods Higher-fat cuts of meats (ribs, t-bone steak, regular hamburger) Bacon, sausage, or hot dogs Cold cuts, such as salami or bologna, deli meats, cured meats, corned beef Organ meats (liver, brains, gizzards, sweetbreads) Poultry with skin Fried or smoked meat, poultry, and fish Whole eggs and egg yolks  (more than 2 -4 per week) Salted legumes, nuts, seeds, or nut/seed butters Meat alternatives with high levels of sodium (>30 0 mg per serving) or saturated fat (>5 g per serving)  Dairy Whole milk,?2% fat milk, buttermilk Whole milk yogurt or ice cream Cream Half-&- half Cream cheese Sour cream Cheese  Vegetables Canned or frozen vegetables with salt, fresh vegetables prepared with salt, butter, cheese, or cream sauce Fried vegetables Pickled vegetables such as olives, pickles, or sauerkraut  Fruits  Fried fruits Fruits served with butter or cream  Oils Butter, stick margarine, shortening Partially hydrogenated oils or trans fats Tropical oils (coconut, palm, palm kerne l oils)  Other Candy, sugar sweetened soft drinks and desserts Salt, sea salt, garlic salt, and seasoning mixes con taining salt Bouillon cubes Ketchup, barbecue sauce, Worcestershire sauce, soy sauce, teriyaki sauce Miso Salsa Pickles, olives, relish     Insulin Injection Instructions, Using Insulin Pens, Adult A subcutaneous injection is a shot of medicine that is injected into the layer of fat and tissue between skin and muscle. People with type 1 diabetes must take insulin because their bodies do not make it. People with type 2 diabetes may need to take insulin. There are many different types of insulin. The type of insulin that you take may determine how many injections you give yourself and when you need to give the injections. Supplies needed:  Soap and water to wash hands.  Your insulin pen.  A new, unused needle.  Alcohol wipes.  A disposal container that is meant for sharp items (sharps container), such as an empty plastic bottle with a cover. How to choose a site for injection The body absorbs insulin differently, depending on where the insulin is injected (injection site). It is best to inject insulin into the same body area each time (for example, always in the abdomen), but you  should use a different spot in that area for each injection. Do not inject the insulin in the same spot each time. There are five main areas that can be used for injecting. These areas include:  Abdomen. This is the preferred area.  Front of thigh.  Upper, outer side of thigh.  Upper, outer side of arm.  Upper, outer part of buttock. How to use an insulin pen  First, follow the steps for Get ready, then continue with the steps for Inject the insulin. Get ready 1. Wash your hands with soap and water. If soap and water are not available, use hand sanitizer. 2. Before you give yourself an insulin injection, be sure to test your blood sugar level (blood glucose level) and write down that number. Follow any instructions from your health care provider about what to do if your blood glucose level is higher or lower than your normal range. 3. Check the expiration date and the type of insulin that is in the pen. 4. If you are using CLEAR insulin, check to see that it is clear and free of clumps. 5. If you are using CLOUDY insulin, do not shake the pen to get the injection ready. Instead, get it ready in one of these ways: ? Gently roll the pen between your palms several times. ? Tip the pen up and down several times. 6. Remove the cap from the insulin pen. 7. Use an alcohol wipe to clean the rubber tip of the pen. 8. Remove the protective paper tab from the disposable needle. Do not let the needle touch anything. 9. Screw a new, unused needle onto the pen. 10. Remove the outer plastic needle cover. Do not throw away the outer plastic cover yet. ? If the pen uses a special safety needle, leave the inner needle shield in place. ? If the pen does not use a special safety needle, remove the inner plastic cover from the needle. 11. Follow the manufacturer's instructions to prime the insulin pen with the volume of insulin needed. Hold the pen with the needle pointing up, and push the button on the  opposite end of the pen until a drop of insulin appears at the needle tip. If no insulin appears, repeat this step. 12. Turn the button (dial) to the number of units of insulin that you will be injecting. Inject the insulin 1. Use an alcohol wipe to clean the site where you will be injecting the needle. Let the site air-dry. 2. Hold the pen in the palm of your writing hand like a pencil. 3. If directed by your health care provider, use your other hand to pinch and hold about an inch (2.5 cm) of skin at the injection site. Do not directly touch the cleaned part of the skin. 4. Gently but quickly, use your writing hand to put the needle straight into the skin. The needle should be at a 90-degree angle (perpendicular) to the skin. 5. When the needle is completely inserted into the skin, use your thumb or index finger of your writing hand to push the top  button of the pen down all the way to inject the insulin. 6. Let go of the skin that you are pinching. Continue to hold the pen in place with your writing hand. 7. Wait 10 seconds, then pull the needle straight out of the skin. This will allow all of the insulin to go from the pen and needle into your body. 8. Carefully put the larger (outer) plastic cover of the needle back over the needle, then unscrew the capped needle and discard it in a sharps container, such as an empty plastic bottle with a cover. 9. Put the plastic cap back on the insulin pen. How to throw away supplies  Discard all used needles in a puncture-proof sharps disposal container. You can ask your local pharmacy about where you can get this kind of disposal container, or you can use an empty plastic liquid laundry detergent bottle that has a cover.  Follow the disposal regulations for the area where you live. Do not use any needle more than one time.  Throw away empty disposable pens in the regular trash. Questions to ask your health care provider  How often should I be taking  insulin?  How often should I check my blood glucose?  What amount of insulin should I be taking at each time?  What are the side effects?  What should I do if my blood glucose is too high?  What should I do if my blood glucose is too low?  What should I do if I forget to take my insulin?  What number should I call if I have questions? Where to find more information  American Diabetes Association (ADA): www.diabetes.org  American Association of Diabetes Educators (AADE) Patient Resources: https://www.diabeteseducator.org Summary  A subcutaneous injection is a shot of medicine that is injected into the layer of fat and tissue between skin and muscle.  Before you give yourself an insulin injection, be sure to test your blood sugar level (blood glucose level) and write down that number.  Check the expiration date and the type of insulin that is in the pen. The type of insulin that you take may determine how many injections you give yourself and when you need to give the injections.  It is best to inject insulin into the same body area each time (for example, always in the abdomen), but you should use a different spot in that area for each injection. This information is not intended to replace advice given to you by your health care provider. Make sure you discuss any questions you have with your health care provider. Document Released: 08/09/2015 Document Revised: 07/26/2017 Document Reviewed: 08/09/2015 Elsevier Interactive Patient Education  2019 [employer].    Hyperglycemia Hyperglycemia is when the sugar (glucose) level in your blood is too high. It may not cause symptoms. If you do have symptoms, they may include warning signs, such as:  Feeling more thirsty than normal.  Hunger.  Feeling tired.  Needing to pee (urinate) more than normal.  Blurry eyesight (vision). You may get other symptoms as it gets worse, such as:  Dry mouth.  Not being hungry (loss of  appetite).  Fruity-smelling breath.  Weakness.  Weight gain or loss that is not planned. Weight loss may be fast.  A tingling or numb feeling in your hands or feet.  Headache.  Skin that does not bounce back quickly when it is lightly pinched and released (poor skin turgor).  Pain in your belly (abdomen).  Cuts or bruises  that heal slowly. High blood sugar can happen to people who do or do not have diabetes. High blood sugar can happen slowly or quickly, and it can be an emergency. Follow these instructions at home: General instructions  Take over-the-counter and prescription medicines only as told by your doctor.  Do not use products that contain nicotine or tobacco, such as cigarettes and e-cigarettes. If you need help quitting, ask your doctor.  Limit alcohol intake to no more than 1 drink per day for nonpregnant women and 2 drinks per day for men. One drink equals 12 oz of beer, 5 oz of wine, or 1 oz of hard liquor.  Manage stress. If you need help with this, ask your doctor.  Keep all follow-up visits as told by your doctor. This is important. Eating and drinking   Stay at a healthy weight.  Exercise regularly, as told by your doctor.  Drink enough fluid, especially when you: ? Exercise. ? Get sick. ? Are in hot temperatures.  Eat healthy foods, such as: ? Low-fat (lean) proteins. ? Complex carbs (complex carbohydrates), such as whole wheat bread or [place] rice. ? Fresh fruits and vegetables. ? Low-fat dairy products. ? Healthy fats.  Drink enough fluid to keep your pee (urine) clear or pale yellow. If you have diabetes:   Make sure you know the symptoms of hyperglycemia.  Follow your diabetes management plan, as told by your doctor. Make sure you: ? Take insulin and medicines as told. ? Follow your exercise plan. ? Follow your meal plan. Eat on time. Do not skip meals. ? Check your blood sugar as often as told. Make sure to check before and after  exercise. If you exercise longer or in a different way than you normally do, check your blood sugar more often. ? Follow your sick day plan whenever you cannot eat or drink normally. Make this plan ahead of time with your doctor.  Share your diabetes management plan with people in your workplace, school, and household.  Check your urine for ketones when you are ill and as told by your doctor.  Carry a card or wear jewelry that says that you have diabetes. Contact a doctor if:  Your blood sugar level is higher than 240 mg/dL (13.3 mmol/L) for 2 days in a row.  You have problems keeping your blood sugar in your target range.  High blood sugar happens often for you. Get help right away if:  You have trouble breathing.  You have a change in how you think, feel, or act (mental status).  You feel sick to your stomach (nauseous), and that feeling does not go away.  You cannot stop throwing up (vomiting). These symptoms may be an emergency. Do not wait to see if the symptoms will go away. Get medical help right away. Call your local emergency services (911 in the [place] Do not drive yourself to the hospital. Summary  Hyperglycemia is when the sugar (glucose) level in your blood is too high.  High blood sugar can happen to people who do or do not have diabetes.  Make sure you drink enough fluids, eat healthy foods, and exercise regularly.  Contact your doctor if you have problems keeping your blood sugar in your target range. This information is not intended to replace advice given to you by your health care provider. Make sure you discuss any questions you have with your health care provider. Document Released: 05/03/2009 Document Revised: 03/23/2016 Document Reviewed: 03/23/2016 Elsevier Interactive Patient  Education  2019 [employer].

## 2018-10-22 DIAGNOSIS — Z7982 Long term (current) use of aspirin: Secondary | ICD-10-CM | POA: Diagnosis not present

## 2018-10-22 DIAGNOSIS — E1165 Type 2 diabetes mellitus with hyperglycemia: Secondary | ICD-10-CM | POA: Diagnosis not present

## 2018-10-22 DIAGNOSIS — E669 Obesity, unspecified: Secondary | ICD-10-CM | POA: Diagnosis not present

## 2018-10-22 DIAGNOSIS — Z9981 Dependence on supplemental oxygen: Secondary | ICD-10-CM | POA: Diagnosis not present

## 2018-10-22 DIAGNOSIS — G4733 Obstructive sleep apnea (adult) (pediatric): Secondary | ICD-10-CM | POA: Diagnosis not present

## 2018-10-22 DIAGNOSIS — K219 Gastro-esophageal reflux disease without esophagitis: Secondary | ICD-10-CM | POA: Diagnosis not present

## 2018-10-22 DIAGNOSIS — I83893 Varicose veins of bilateral lower extremities with other complications: Secondary | ICD-10-CM | POA: Diagnosis not present

## 2018-10-22 DIAGNOSIS — I10 Essential (primary) hypertension: Secondary | ICD-10-CM | POA: Diagnosis not present

## 2018-10-22 DIAGNOSIS — Z9181 History of falling: Secondary | ICD-10-CM | POA: Diagnosis not present

## 2018-10-22 DIAGNOSIS — Z87891 Personal history of nicotine dependence: Secondary | ICD-10-CM | POA: Diagnosis not present

## 2018-10-22 DIAGNOSIS — Z6841 Body Mass Index (BMI) 40.0 and over, adult: Secondary | ICD-10-CM | POA: Diagnosis not present

## 2018-10-22 DIAGNOSIS — Z794 Long term (current) use of insulin: Secondary | ICD-10-CM | POA: Diagnosis not present

## 2018-10-22 DIAGNOSIS — C50911 Malignant neoplasm of unspecified site of right female breast: Secondary | ICD-10-CM | POA: Diagnosis not present

## 2018-10-23 DIAGNOSIS — C50911 Malignant neoplasm of unspecified site of right female breast: Secondary | ICD-10-CM | POA: Diagnosis not present

## 2018-10-23 DIAGNOSIS — I10 Essential (primary) hypertension: Secondary | ICD-10-CM | POA: Diagnosis not present

## 2018-10-23 DIAGNOSIS — G4733 Obstructive sleep apnea (adult) (pediatric): Secondary | ICD-10-CM | POA: Diagnosis not present

## 2018-10-23 DIAGNOSIS — E1165 Type 2 diabetes mellitus with hyperglycemia: Secondary | ICD-10-CM | POA: Diagnosis not present

## 2018-10-23 DIAGNOSIS — I83893 Varicose veins of bilateral lower extremities with other complications: Secondary | ICD-10-CM | POA: Diagnosis not present

## 2018-10-23 DIAGNOSIS — E669 Obesity, unspecified: Secondary | ICD-10-CM | POA: Diagnosis not present

## 2018-11-17 ENCOUNTER — Inpatient Hospital Stay: Admitting: Family Medicine

## 2018-11-28 ENCOUNTER — Telehealth (INDEPENDENT_AMBULATORY_CARE_PROVIDER_SITE_OTHER): Payer: Medicare Other | Admitting: Family Medicine

## 2018-11-28 DIAGNOSIS — Z9989 Dependence on other enabling machines and devices: Secondary | ICD-10-CM | POA: Diagnosis not present

## 2018-11-28 DIAGNOSIS — I10 Essential (primary) hypertension: Secondary | ICD-10-CM

## 2018-11-28 DIAGNOSIS — C50919 Malignant neoplasm of unspecified site of unspecified female breast: Secondary | ICD-10-CM | POA: Diagnosis not present

## 2018-11-28 DIAGNOSIS — E1165 Type 2 diabetes mellitus with hyperglycemia: Secondary | ICD-10-CM | POA: Diagnosis not present

## 2018-11-28 DIAGNOSIS — G4733 Obstructive sleep apnea (adult) (pediatric): Secondary | ICD-10-CM

## 2018-11-28 DIAGNOSIS — Z794 Long term (current) use of insulin: Secondary | ICD-10-CM | POA: Diagnosis not present

## 2018-11-28 MED ORDER — INSULIN ASPART 100 UNIT/ML FLEXPEN: 0.0000 [IU] | PEN_INJECTOR | Freq: Every day | SUBCUTANEOUS | 11 refills | Status: DC

## 2018-11-28 MED ORDER — DULAGLUTIDE 0.75 MG/0.5ML ~~LOC~~ SOAJ: 0.7500 mg | SUBCUTANEOUS | 5 refills | Status: DC

## 2018-11-28 MED ORDER — INSULIN PEN NEEDLE 33G X 4 MM MISC: 1.0000 | Freq: Three times a day (TID) | 2 refills | Status: DC

## 2018-11-28 MED ORDER — INSULIN GLARGINE 100 UNIT/ML SOLOSTAR PEN: 22.0000 [IU] | PEN_INJECTOR | Freq: Every day | SUBCUTANEOUS | 11 refills | Status: DC

## 2018-11-28 MED ORDER — BLOOD GLUCOSE MONITOR KIT: PACK | 0 refills | Status: DC

## 2018-11-28 MED ORDER — LISINOPRIL-HYDROCHLOROTHIAZIDE 20-12.5 MG PO TABS: 1.0000 | ORAL_TABLET | Freq: Every day | ORAL | 3 refills | Status: DC

## 2018-11-28 NOTE — Progress Notes (Signed)
Virtual Visit via Video Note  I connected with Emily Gray on 11/28/18 at  9:30 AM EDT by a video enabled telemedicine application and verified that I am speaking with the correct person using two identifiers.  Location: Patient: Located at home during today's encounter  Provider: Located at primary care office     I discussed the limitations of evaluation and management by telemedicine and the availability of in person appointments. The patient expressed understanding and agreed to proceed.  History of Present Illness: Emily Gray is a 73 year old female who currently resides with her daughter presenting via video for telehealth visit.  Medical history significant for morbid obesity, GERD, breast cancer type II uncontrolled diabetes with hyperglycemia, hypertension, and obstructive sleep apnea.  Patient was recently hospitalized for DKA and found to have an A1c of 13.1.  Patient reports over 6 to 7 years ago she was told by another healthcare provider that she had type 2 diabetes however no medication was indicated at that time. Over the years she has not routinely checked her blood sugar. Prior to hospitalization daughter observe that Emily Gray was frequently urinating and appeared ill.  Daughter reports prior to taking patient to the hospital checking her blood sugar at home only to obtain a reading of "high".  In her recent hospitalization she was started on both long-acting Lantus insulin 22 units at bedtime and NovoLog sliding scale.  At discharge patient continued to have readings for approximately a week and a half in the upper 300s per daughter.  Most recent readings range anywhere from 190s to 200.  Blood sugar today was 170 fasting.  Patient endorses skipping breakfast and sometimes lunch.  She eats dinner daily. She endorses occasional snacking however does not eat anything after 7:00 pm.  Her daughter is her caregiver and reports that she is monitoring the types of food that Emily Gray eats  and loosely only drinks water. Currently on ACE. No statin therapy.  Health maintenance Received pneumonia- Prevnar 17 vaccine during recent hospitalization Last eye exam a year and a half ago.  Needs a diabetic eye exam. Currently treated for osteoporosis with Fosamax bone density test completed sometime in the past.  Hypertension  Currently takes blood pressure medications.  No history of any other cardiovascular conditions.  No routine monitoring of blood pressure however endorses monitoring of sodium. Currently takes lisinopril hydrochlorothiazide for management of blood pressure. Patient denies any chest pain, swelling,or headaches. She endorses shortness of breath with exertional activities only. She is active and walking for exercise.  Observations/Objective: Morbidly obese, non-distressed, regular breathing pattern, cognition appropriate for age. Negative for observable neurological deficits.  Assessment and Plan: 1. Essential hypertension -Uncertain of control -Blood pressure check with lab visit.  -Continue lisinopril-HCTZ  2. Type 2 diabetes mellitus with hyperglycemia, with long-term current use of insulin (HCC) Not at goal with home readings. Discussed adding Metformin.  Patient and daughter are opposed to adding metformin as they fear adverse effects with metformin due to personal research that they have done.  Interested in starting Trulicity however uncertain of insurance coverage.  We will go ahead and initiate treatment with Trulicity 5.36 weekly.  Daughter will notify me if there is an issue with insurance and affordability of picking up medication.  We discussed alternatives to include Victoza and Januvia as alternative options for adjunctive diabetes management.  - Hemoglobin A1c; Future - CBC with Differential; Future - Lipid panel; Future - Ambulatory referral to Ophthalmology  3. Morbid obesity ([place]) Encouraged  efforts to reduce weight include engaging in physical  activity as tolerated with goal of 150 minutes per week. Improve dietary choices and eat a meal regimen consistent with a Mediterranean or DASH diet. Reduce simple carbohydrates. Do not skip meals and eat healthy snacks throughout the day to avoid over-eating at dinner. Set a goal weight loss that is achievable for you. Checking: - Comprehensive metabolic panel; Future - Thyroid Panel With TSH; Future - Lipid panel; Future  4. Malignant neoplasm of female breast, unspecified estrogen receptor status, unspecified laterality, unspecified site of breast [facility]) -Managed by oncologist in [place], [place] Dr. Berton Mount. She will continue to follow current oncologist  for management of breast cancer. -Continue Letrozole   - CBC with Differential; evaluate for anemia or leukopenia   5. OSA on CPAP - No recent follow-up or evaluation of effective of current CPAP therapy with settings. Will refer to sleep medicine. Ambulatory referral to Neurology   Follow Up Instructions: Return in 1 week for labs  I discussed the assessment and treatment plan with the patient. The patient was provided an opportunity to ask questions and all were answered. The patient agreed with the plan and demonstrated an understanding of the instructions.   The patient was advised to call back or seek an in-person evaluation if the symptoms worsen or if the condition fails to improve as anticipated.  I provided 30 minutes of non-face-to-face time during this encounter.   Molli Barrows, FNP

## 2018-12-06 ENCOUNTER — Other Ambulatory Visit: Payer: Self-pay

## 2018-12-06 ENCOUNTER — Other Ambulatory Visit: Payer: Medicare Other

## 2018-12-06 DIAGNOSIS — Z794 Long term (current) use of insulin: Secondary | ICD-10-CM | POA: Diagnosis not present

## 2018-12-06 DIAGNOSIS — C50919 Malignant neoplasm of unspecified site of unspecified female breast: Secondary | ICD-10-CM | POA: Diagnosis not present

## 2018-12-06 DIAGNOSIS — E1165 Type 2 diabetes mellitus with hyperglycemia: Secondary | ICD-10-CM | POA: Diagnosis not present

## 2018-12-07 LAB — COMPREHENSIVE METABOLIC PANEL
ALT: 36 IU/L — ABNORMAL HIGH (ref 0–32)
AST: 40 IU/L (ref 0–40)
Albumin/Globulin Ratio: 1.7 (ref 1.2–2.2)
Albumin: 4.3 g/dL (ref 3.7–4.7)
Alkaline Phosphatase: 88 IU/L (ref 39–117)
BUN/Creatinine Ratio: 19 (ref 12–28)
BUN: 16 mg/dL (ref 8–27)
Bilirubin Total: 0.4 mg/dL (ref 0.0–1.2)
CO2: 20 mmol/L (ref 20–29)
Calcium: 9.2 mg/dL (ref 8.7–10.3)
Chloride: 101 mmol/L (ref 96–106)
Creatinine, Ser: 0.85 mg/dL (ref 0.57–1.00)
GFR calc Af Amer: 80 mL/min/{1.73_m2} (ref >59)
GFR calc non Af Amer: 69 mL/min/{1.73_m2} (ref >59)
Globulin, Total: 2.6 g/dL (ref 1.5–4.5)
Glucose: 139 mg/dL — ABNORMAL HIGH (ref 65–99)
Potassium: 3.6 mmol/L (ref 3.5–5.2)
Sodium: 141 mmol/L (ref 134–144)
Total Protein: 6.9 g/dL (ref 6.0–8.5)

## 2018-12-07 LAB — THYROID PANEL WITH TSH
Free Thyroxine Index: 2 (ref 1.2–4.9)
T3 Uptake Ratio: 21 % — ABNORMAL LOW (ref 24–39)
T4, Total: 9.7 ug/dL (ref 4.5–12.0)
TSH: 1.29 u[IU]/mL (ref 0.450–4.500)

## 2018-12-07 LAB — CBC WITH DIFFERENTIAL/PLATELET
Basophils Absolute: 0 10*3/uL (ref 0.0–0.2)
Basos: 1 %
EOS (ABSOLUTE): 0.3 10*3/uL (ref 0.0–0.4)
Eos: 7 %
Hematocrit: 39.5 % (ref 34.0–46.6)
Hemoglobin: 13.2 g/dL (ref 11.1–15.9)
Immature Grans (Abs): 0 10*3/uL (ref 0.0–0.1)
Immature Granulocytes: 1 %
Lymphocytes Absolute: 1.5 10*3/uL (ref 0.7–3.1)
Lymphs: 35 %
MCH: 28.9 pg (ref 26.6–33.0)
MCHC: 33.4 g/dL (ref 31.5–35.7)
MCV: 87 fL (ref 79–97)
Monocytes Absolute: 0.3 10*3/uL (ref 0.1–0.9)
Monocytes: 7 %
Neutrophils Absolute: 2.2 10*3/uL (ref 1.4–7.0)
Neutrophils: 49 %
Platelets: 237 10*3/uL (ref 150–450)
RBC: 4.56 x10E6/uL (ref 3.77–5.28)
RDW: 13.2 % (ref 11.7–15.4)
WBC: 4.4 10*3/uL (ref 3.4–10.8)

## 2018-12-07 LAB — HEMOGLOBIN A1C
Est. average glucose Bld gHb Est-mCnc: 255 mg/dL
Hgb A1c MFr Bld: 10.5 % — ABNORMAL HIGH (ref 4.8–5.6)

## 2018-12-07 LAB — LIPID PANEL
Chol/HDL Ratio: 6.1 ratio — ABNORMAL HIGH (ref 0.0–4.4)
Cholesterol, Total: 207 mg/dL — ABNORMAL HIGH (ref 100–199)
HDL: 34 mg/dL — ABNORMAL LOW (ref >39)
LDL Calculated: 150 mg/dL — ABNORMAL HIGH (ref 0–99)
Triglycerides: 116 mg/dL (ref 0–149)
VLDL Cholesterol Cal: 23 mg/dL (ref 5–40)

## 2018-12-08 ENCOUNTER — Telehealth: Payer: Self-pay | Admitting: Neurology

## 2018-12-08 ENCOUNTER — Institutional Professional Consult (permissible substitution): Payer: Self-pay | Admitting: Neurology

## 2018-12-08 ENCOUNTER — Other Ambulatory Visit: Payer: Self-pay

## 2018-12-08 MED ORDER — INSULIN PEN NEEDLE 32G X 4 MM MISC: 3 refills | Status: DC

## 2018-12-08 NOTE — Telephone Encounter (Signed)
Pt was no show to apt scheduled today for sleep consult

## 2018-12-13 MED ORDER — ATORVASTATIN CALCIUM 20 MG PO TABS: 20.0000 mg | ORAL_TABLET | Freq: Every day | ORAL | 3 refills | Status: DC

## 2018-12-13 NOTE — Progress Notes (Signed)
Atorvastatin 20 mg ordered and sent to [facility]

## 2018-12-19 ENCOUNTER — Telehealth: Payer: Self-pay | Admitting: Family Medicine

## 2018-12-19 NOTE — Telephone Encounter (Signed)
Patient called requesting lab results, please follow up °

## 2018-12-27 ENCOUNTER — Encounter: Payer: Medicare Other | Attending: Family Medicine | Admitting: *Deleted

## 2018-12-29 DIAGNOSIS — Z170 Estrogen receptor positive status [ER+]: Secondary | ICD-10-CM | POA: Diagnosis not present

## 2018-12-29 DIAGNOSIS — M170 Bilateral primary osteoarthritis of knee: Secondary | ICD-10-CM | POA: Diagnosis not present

## 2018-12-29 DIAGNOSIS — I10 Essential (primary) hypertension: Secondary | ICD-10-CM | POA: Diagnosis not present

## 2018-12-29 DIAGNOSIS — Z87891 Personal history of nicotine dependence: Secondary | ICD-10-CM | POA: Diagnosis not present

## 2018-12-29 DIAGNOSIS — R262 Difficulty in walking, not elsewhere classified: Secondary | ICD-10-CM | POA: Diagnosis not present

## 2018-12-29 DIAGNOSIS — C50311 Malignant neoplasm of lower-inner quadrant of right female breast: Secondary | ICD-10-CM | POA: Diagnosis not present

## 2018-12-29 DIAGNOSIS — C50319 Malignant neoplasm of lower-inner quadrant of unspecified female breast: Secondary | ICD-10-CM | POA: Diagnosis not present

## 2018-12-29 DIAGNOSIS — Z7982 Long term (current) use of aspirin: Secondary | ICD-10-CM | POA: Diagnosis not present

## 2018-12-29 DIAGNOSIS — E669 Obesity, unspecified: Secondary | ICD-10-CM | POA: Diagnosis not present

## 2018-12-29 DIAGNOSIS — Z79899 Other long term (current) drug therapy: Secondary | ICD-10-CM | POA: Diagnosis not present

## 2019-01-18 DIAGNOSIS — C50311 Malignant neoplasm of lower-inner quadrant of right female breast: Secondary | ICD-10-CM | POA: Diagnosis not present

## 2019-01-27 ENCOUNTER — Telehealth: Payer: Self-pay

## 2019-01-27 NOTE — Telephone Encounter (Signed)
Called patient to do their pre-visit COVID screening.  Call went to voicemail. Unable to do prescreening.  

## 2019-01-30 ENCOUNTER — Encounter: Payer: Self-pay | Admitting: Family Medicine

## 2019-01-30 ENCOUNTER — Other Ambulatory Visit: Payer: Self-pay

## 2019-01-30 ENCOUNTER — Ambulatory Visit (INDEPENDENT_AMBULATORY_CARE_PROVIDER_SITE_OTHER): Payer: Medicare Other | Admitting: Family Medicine

## 2019-01-30 VITALS — BP 148/87 | HR 88 | Temp 97.5°F | Resp 17 | Ht 62.5 in | Wt 251.0 lb

## 2019-01-30 DIAGNOSIS — I10 Essential (primary) hypertension: Secondary | ICD-10-CM | POA: Diagnosis not present

## 2019-01-30 DIAGNOSIS — M25562 Pain in left knee: Secondary | ICD-10-CM | POA: Diagnosis not present

## 2019-01-30 DIAGNOSIS — Z794 Long term (current) use of insulin: Secondary | ICD-10-CM | POA: Diagnosis not present

## 2019-01-30 DIAGNOSIS — E1165 Type 2 diabetes mellitus with hyperglycemia: Secondary | ICD-10-CM | POA: Diagnosis not present

## 2019-01-30 DIAGNOSIS — E7841 Elevated Lipoprotein(a): Secondary | ICD-10-CM

## 2019-01-30 DIAGNOSIS — G4733 Obstructive sleep apnea (adult) (pediatric): Secondary | ICD-10-CM

## 2019-01-30 MED ORDER — BLOOD PRESSURE KIT: PACK | 0 refills | Status: DC

## 2019-01-30 MED ORDER — ATORVASTATIN CALCIUM 20 MG PO TABS: 20.0000 mg | ORAL_TABLET | Freq: Every day | ORAL | 3 refills | Status: DC

## 2019-01-30 NOTE — Patient Instructions (Signed)

## 2019-01-30 NOTE — Progress Notes (Signed)
Patient ID: Emily Gray, female    DOB: December 27, 1945, 73 y.o.   MRN: [id number]  PCP: Scot Jun, FNP  Chief Complaint  Patient presents with  . Diabetes  . Hypertension  . Hyperlipidemia    Subjective:  HPI  Emily Gray is a 73 y.o. female presents for evaluation diabetes, hypertension, and hyperlipidemia.  Emily Gray has Varicose veins of lower extremities with other complications; Hyperglycemia; DKA (diabetic ketoacidoses) ([place]); Hypertension; OSA (obstructive sleep apnea); AKI (acute kidney injury) ([place]); Breast cancer ([place]); DM (diabetes mellitus), type 2 ([place]); and Obesity on their problem list.    She is followed by oncologist in [facility] for management of breast cancer treatment.   Diabetes Emily Gray  monitor glucose at home. She is adheres to current medication regimen.  Since her previous telemedicine visit approximately 6 weeks ago patient has made drastic changes to her diet and has introduced exercise into her daily routine.  She reports her blood sugars been diabetic insulin.  She has not administered her long-acting insulin daily either as her blood sugars have often range between 110 and 120.  Her last A1C 10.5 during her hospital admission March 2020. She endorses a 20 lb weight loss since March due to diet changes. She was prescribed Trulicity during her encounter on 11/28/18 but reports today the pharmacy could not get medication approved so it was never started. She also never started cholesterol medication. She denies polyuria, polyphagia, or polydipsia. She is scheduled for an eye exam in [place] her home town.  Hypertension  No home monitoring of blood pressure at home. BP is elevated today. Reports compliance with lisinopril-HCTZ. Endorses recently reducing sodium. Denies edema, shortness of breath, weakness, of dizziness.  Knee Pain, Left Complains of recurrent knee pain. Left knee has been aching and at times when exercising feels the  sensation that the knee is going to "give out". She has used topical analgesic rubs with minimal relief. Unaware of any prior diagnosis of arthritis. Thinks in the past she has had a steroid injection although uncertain if it was a left knee. Denies any recent falls. She takes fosamax due to osteopenia related to breast cancer treatment. Social History   Socioeconomic History  . Marital status: Married    Spouse name: Not on file  . Number of children: Not on file  . Years of education: Not on file  . Highest education level: Not on file  Occupational History  . Not on file  Social Needs  . Financial resource strain: Not on file  . Food insecurity    Worry: Not on file    Inability: Not on file  . Transportation needs    Medical: Not on file    Non-medical: Not on file  Tobacco Use  . Smoking status: Former Research scientist (life sciences)  . Smokeless tobacco: Never Used  Substance and Sexual Activity  . Alcohol use: No  . Drug use: No  . Sexual activity: Not on file  Lifestyle  . Physical activity    Days per week: Not on file    Minutes per session: Not on file  . Stress: Not on file  Relationships  . Social Herbalist on phone: Not on file    Gets together: Not on file    Attends religious service: Not on file    Active member of club or organization: Not on file    Attends meetings of clubs or organizations: Not on file    Relationship  status: Not on file  . Intimate partner violence    Fear of current or ex partner: Not on file    Emotionally abused: Not on file    Physically abused: Not on file    Forced sexual activity: Not on file  Other Topics Concern  . Not on file  Social History Narrative  . Not on file    Family History  Problem Relation Age of Onset  . Hypertension Mother   . Cancer Father   . Hypertension Father      Review of Systems  Pertinent negatives listed in HPI No Known Allergies  Prior to Admission medications   Medication Sig Start Date End  Date Taking? Authorizing Provider  alendronate (FOSAMAX) 70 MG tablet Take 70 mg by mouth once a week. 08/05/18  Yes [provider]  aspirin 81 MG tablet Take 81 mg by mouth daily.   Yes [provider]  cholecalciferol (VITAMIN D3) 25 MCG (1000 UT) tablet Take 1 tablet (1,000 Units total) by mouth daily. 10/20/18  Yes Debbe Odea, MD  insulin aspart (NOVOLOG FLEXPEN) 100 UNIT/ML FlexPen Inject 0-5 Units into the skin at bedtime. 121-151:1 unit, 201 - 250:2 units, 251 - 300:3 units,301 - 350:4 units,351 - 400:5 units 11/28/18  Yes Scot Jun, FNP  Insulin Glargine (LANTUS SOLOSTAR) 100 UNIT/ML Solostar Pen Inject 22 Units into the skin daily. 11/28/18  Yes Scot Jun, FNP  Insulin Pen Needle 32G X 4 MM MISC Check blood sugars before meals and at bedtime. Dx: E11.9 12/08/18  Yes Scot Jun, FNP  letrozole [facility]) 2.5 MG tablet Take 2.5 mg by mouth daily. 07/17/18  Yes [provider]  lisinopril-hydrochlorothiazide (ZESTORETIC) 20-12.5 MG tablet Take 1 tablet by mouth daily. 11/28/18  Yes Scot Jun, FNP  atorvastatin (LIPITOR) 20 MG tablet Take 1 tablet (20 mg total) by mouth daily. Patient not taking: Reported on 01/30/2019 12/13/18   Scot Jun, FNP  blood glucose meter kit and supplies KIT Dispense based on patient and insurance preference. Use up to four times daily as directed. E11.89 check blood sugar QID and PRN Patient not taking: Reported on 01/30/2019 11/28/18   Scot Jun, FNP  Dulaglutide (TRULICITY) 3.38 SN/0.5LZ SOPN Inject 0.75 mg into the skin once a week. Patient not taking: Reported on 01/30/2019 11/28/18   Scot Jun, FNP    Past Medical, Surgical Family and Social History reviewed and updated.    Objective:   Today's Vitals   01/30/19 1032  BP: (!) 148/87  Pulse: 88  Resp: 17  Temp: (!) 97.5 F (36.4 C)  TempSrc: Temporal  SpO2: 96%  Weight: 251 lb (113.9 kg)  Height: 5' 2.5" (1.588 m)     BP Readings from Last 3 Encounters:  01/30/19 (!) 148/87  10/20/18 (!) 105/59  10/06/13 140/74    Filed Weights   01/30/19 1032  Weight: 251 lb (113.9 kg)       Physical Exam General appearance: alert, well developed, well nourished, cooperative and in no distress Head: Normocephalic, without obvious abnormality, atraumatic Respiratory: Respirations even and unlabored, normal respiratory rate Heart: rate and rhythm normal. No gallop or murmurs noted on exam  Abdomen: BS +, no distention, no rebound tenderness, or no mass Extremities: No gross deformities Skin: Skin color, texture, turgor normal. No rashes seen  Psych: Appropriate mood and affect. Neurologic: Mental status: Alert, oriented to person, place, and time, thought content appropriate.  No results found for: POCGLU  Lab Results  Component Value Date   HGBA1C 10.5 (H) 12/06/2018    Assessment & Plan:  1. Type 2 diabetes mellitus with hyperglycemia, with long-term current use of insulin (HCC)-reviewed 6 weeks of blood sugar readings and they have markedly improved since patient's hospitalization back in June.  Advised that we will obtain an A1c from that point will make recommendations on medication management.  She can discontinue any short rapid acting insulin as her blood sugars provided in office today do not warrant rapid acting insulin. Advised we will likely adjust the Basalgar 10-15 units daily based on A1C. - Hemoglobin A1c - Comprehensive metabolic panel  2. OSA (obstructive sleep apnea) -compliant with CPAP  3. Morbid obesity ([place]) Great Job at recent weight loss! Encouraged efforts to reduce weight include engaging in physical activity as tolerated with goal of 150 minutes per week. Improve dietary choices and eat a meal regimen consistent with a Mediterranean or DASH diet. Reduce simple carbohydrates. Do not skip meals and eat healthy snacks throughout the day to avoid over-eating at dinner. Set a goal  weight loss that is achievable for you.  4. Acute pain of left knee - Ambulatory referral to Orthopedic Surgery  5. Essential hypertension Elevated today. Uncertain of home readings. Reported blood pressure cuff for home blood pressure readings.  Advised patient that goal blood pressure readings should be less than 140/90 optimal at less than 130/90.  We will continue current dose of medication for now adjustments may be made if readings are increased at home.  6. Elevated lipoprotein(a) - Lipid panel   RTC: 3 months for chronic condition follow-up and A1C    Molli Barrows, FNP [facility] [address] [place] [phone]: [phone]

## 2019-01-31 LAB — LIPID PANEL
Chol/HDL Ratio: 5 ratio — ABNORMAL HIGH (ref 0.0–4.4)
Cholesterol, Total: 191 mg/dL (ref 100–199)
HDL: 38 mg/dL — ABNORMAL LOW (ref >39)
LDL Calculated: 133 mg/dL — ABNORMAL HIGH (ref 0–99)
Triglycerides: 100 mg/dL (ref 0–149)
VLDL Cholesterol Cal: 20 mg/dL (ref 5–40)

## 2019-01-31 LAB — COMPREHENSIVE METABOLIC PANEL
ALT: 28 IU/L (ref 0–32)
AST: 27 IU/L (ref 0–40)
Albumin/Globulin Ratio: 1.7 (ref 1.2–2.2)
Albumin: 4.4 g/dL (ref 3.7–4.7)
Alkaline Phosphatase: 96 IU/L (ref 39–117)
BUN/Creatinine Ratio: 21 (ref 12–28)
BUN: 17 mg/dL (ref 8–27)
Bilirubin Total: 0.3 mg/dL (ref 0.0–1.2)
CO2: 22 mmol/L (ref 20–29)
Calcium: 10 mg/dL (ref 8.7–10.3)
Chloride: 101 mmol/L (ref 96–106)
Creatinine, Ser: 0.82 mg/dL (ref 0.57–1.00)
GFR calc Af Amer: 83 mL/min/{1.73_m2} (ref >59)
GFR calc non Af Amer: 72 mL/min/{1.73_m2} (ref >59)
Globulin, Total: 2.6 g/dL (ref 1.5–4.5)
Glucose: 137 mg/dL — ABNORMAL HIGH (ref 65–99)
Potassium: 4.5 mmol/L (ref 3.5–5.2)
Sodium: 141 mmol/L (ref 134–144)
Total Protein: 7 g/dL (ref 6.0–8.5)

## 2019-01-31 LAB — HEMOGLOBIN A1C
Est. average glucose Bld gHb Est-mCnc: 163 mg/dL
Hgb A1c MFr Bld: 7.3 % — ABNORMAL HIGH (ref 4.8–5.6)

## 2019-02-02 ENCOUNTER — Other Ambulatory Visit: Payer: Self-pay | Admitting: Family Medicine

## 2019-02-02 NOTE — Telephone Encounter (Addendum)
Notify patient of the following regarding labs:  A1C has improved-Decrease Lantus 15 units daily. Hold dose if BS 125 or less to reduce the risk of hypoglycemia.  Start atorvastatin as cholesterol remains abnormal.  Return for 3 month follow-up.   Please process request for blood pressure kit through [facility]. I'm not sure if patient's insurance will qualify and made her aware that the order for BP cuff would be submitted.

## 2019-02-03 MED ORDER — LANTUS SOLOSTAR 100 UNIT/ML ~~LOC~~ SOPN: 15.0000 [IU] | PEN_INJECTOR | Freq: Every day | SUBCUTANEOUS | 11 refills | Status: DC

## 2019-02-03 NOTE — Telephone Encounter (Signed)
Patient & daughter Emily Gray notified of lab results & recommendations. Expressed understanding. Repeated medication changes back correctly. Patient has a follow up appointment scheduled 05/03/2019. Prescription sent to pharmacy on file.  Prescription for BP cuff has already been faxed to [facility].

## 2019-03-02 ENCOUNTER — Encounter: Payer: Self-pay | Admitting: Orthopaedic Surgery

## 2019-03-02 ENCOUNTER — Ambulatory Visit (INDEPENDENT_AMBULATORY_CARE_PROVIDER_SITE_OTHER): Payer: Medicare Other | Admitting: Orthopaedic Surgery

## 2019-03-02 ENCOUNTER — Other Ambulatory Visit: Payer: Self-pay

## 2019-03-02 ENCOUNTER — Ambulatory Visit: Payer: Self-pay

## 2019-03-02 VITALS — BP 139/72 | HR 79 | Ht 64.0 in | Wt 250.0 lb

## 2019-03-02 DIAGNOSIS — G8929 Other chronic pain: Secondary | ICD-10-CM

## 2019-03-02 DIAGNOSIS — M25562 Pain in left knee: Secondary | ICD-10-CM | POA: Diagnosis not present

## 2019-03-02 DIAGNOSIS — M25561 Pain in right knee: Secondary | ICD-10-CM

## 2019-03-02 DIAGNOSIS — M170 Bilateral primary osteoarthritis of knee: Secondary | ICD-10-CM | POA: Insufficient documentation

## 2019-03-02 MED ORDER — BUPIVACAINE HCL 0.25 % IJ SOLN
2.0000 mL | INTRAMUSCULAR | Status: AC | PRN
  Administered 2019-03-02: 15:00:00 2 mL via INTRA_ARTICULAR

## 2019-03-02 MED ORDER — LIDOCAINE HCL 1 % IJ SOLN
2.0000 mL | INTRAMUSCULAR | Status: AC | PRN
  Administered 2019-03-02: 2 mL

## 2019-03-02 MED ORDER — METHYLPREDNISOLONE ACETATE 40 MG/ML IJ SUSP
80.0000 mg | INTRAMUSCULAR | Status: AC | PRN
  Administered 2019-03-02: 15:00:00 80 mg via INTRA_ARTICULAR

## 2019-03-02 NOTE — Progress Notes (Addendum)
Office Visit Note   Patient: Emily Gray           Date of Birth: 1946/06/24           MRN: [id number] Visit Date: 03/02/2019              Requested by: Scot Jun, [address] [place] [place],  [place] [id number] PCP: Scot Jun, FNP   Assessment & Plan: Visit Diagnoses:  1. Chronic pain of both knees   2. Tricompartment osteoarthritis of both knees     Plan:  #1: At this time we are going to inject the left knee which is her most symptomatic.  She tolerated the procedure well. #2: She states that the right is not as bad and will decide later on about possible corticosteroid injection.  Follow-Up Instructions: No follow-ups on file.   Orders:  Orders Placed This Encounter  Procedures  . XR KNEE 3 VIEW LEFT  . XR KNEE 3 VIEW RIGHT   No orders of the defined types were placed in this encounter.     Procedures: Large Joint Inj: L knee on 03/02/2019 2:59 PM Indications: pain and diagnostic evaluation Details: 25 G 1.5 in needle, anteromedial approach  Arthrogram: No  Medications: 2 mL lidocaine 1 %; 80 mg methylPREDNISolone acetate 40 MG/ML; 2 mL bupivacaine 0.25 % Outcome: tolerated well, no immediate complications Procedure, treatment alternatives, risks and benefits explained, specific risks discussed. Consent was given by the patient. Immediately prior to procedure a time out was called to verify the correct patient, procedure, equipment, support staff and site/side marked as required. Patient was prepped and draped in the usual sterile fashion.       Clinical Data: No additional findings.   Subjective: Chief Complaint  Patient presents with  . Right Knee - Pain  . Left Knee - Pain  Patient presents today for bilateral knee pain. She said that the left is worse than the right. She said that they started years ago and have continued to hurt. Her pain is located medially and seems to be worse with weightbearing. She takes Advil  occasionally.   HPI  Emily Gray is a very pleasant 73 year old African-American female who presents today with bilateral knee pain left greater than right.  Is been chronically a problem over the years however most recently she is having more problems with weightbearing.  Most of her pain is on the inside (medial  Knee).  She is noticing that she is having more difficulty with weightbearing.  Some nighttime pain.  Using Advil intermittently.  Seen today at her request for evaluation.   Review of Systems  Constitutional: Negative for fatigue.  HENT: Negative for ear pain.   Eyes: Negative for pain.  Respiratory: Negative for shortness of breath.   Cardiovascular: Positive for leg swelling.  Gastrointestinal: Negative for constipation and diarrhea.  Endocrine: Negative for cold intolerance and heat intolerance.  Genitourinary: Negative for difficulty urinating.  Musculoskeletal: Negative for joint swelling.  Skin: Negative for rash.  Allergic/Immunologic: Negative for food allergies.  Neurological: Negative for weakness.  Hematological: Does not bruise/bleed easily.  Psychiatric/Behavioral: Negative for sleep disturbance.     Objective: Vital Signs: BP 139/72   Pulse 79   Ht _0  (1.626 m)   Wt 250 lb (113.4 kg)   BMI 42.91 kg/m   Physical Exam Constitutional:      Appearance: Normal appearance. She is well-developed. She is obese.  HENT:     Head: Normocephalic  and atraumatic.  Eyes:     Pupils: Pupils are equal, round, and reactive to light.  Pulmonary:     Effort: Pulmonary effort is normal.  Skin:    General: Skin is warm and dry.  Neurological:     Mental Status: She is alert and oriented to person, place, and time.  Psychiatric:        Behavior: Behavior normal.     Ortho Exam  Exam today reveals about 90 to 95 degrees of flexion bilaterally.  She has crepitance patellofemoral with range of motion bilaterally.  Cannot tell exactly if she has effusion or not because  the size of her knee and legs.  She does have a little bit of pseudolaxity with valgus stressing.  But she has excellent endpoint.  Hip motion does not cause her much in the way of any pain.  Neurovascular intact distally.  Calf is supple and nontender bilaterally.  Specialty Comments:  No specialty comments available.  Imaging: Xr Knee 3 View Left  Result Date: 03/02/2019 Three-view x-ray of the left knee reveals rather significant tricompartment osteoarthritis with joint space narrowing more along the medial compartment and patellofemoral.  She has about a 10 to 11degrees varus deformity noted.  Peaked intercondylar spines.  Periarticular osteophytes with lateral positioning of the patella.  Xr Knee 3 View Right  Result Date: 03/02/2019 Three-view x-ray of the right knee reveals valgus deformity with end-stage bone-on-bone medial compartment narrowing. periarticular osteophytes both medial and laterally.  Level also has lateral positioning of the patella on the AP.    PMFS History: Current Outpatient Medications  Medication Sig Dispense Refill  . alendronate (FOSAMAX) 70 MG tablet Take 70 mg by mouth once a week.    Marland Kitchen aspirin 81 MG tablet Take 81 mg by mouth daily.    Marland Kitchen atorvastatin (LIPITOR) 20 MG tablet Take 1 tablet (20 mg total) by mouth daily. 90 tablet 3  . blood glucose meter kit and supplies KIT Dispense based on patient and insurance preference. Use up to four times daily as directed. E11.89 check blood sugar QID and PRN 1 each 0  . Blood Pressure KIT 1 KIT DISPENSE. PATIENT HAS UNCONTROLLED BP-NEEDS HOME MONITORING  ICD-I10 1 kit 0  . cholecalciferol (VITAMIN D3) 25 MCG (1000 UT) tablet Take 1 tablet (1,000 Units total) by mouth daily. 30 tablet 0  . Insulin Glargine (LANTUS SOLOSTAR) 100 UNIT/ML Solostar Pen Inject 15 Units into the skin daily. 15 mL 11  . Insulin Pen Needle 32G X 4 MM MISC Check blood sugars before meals and at bedtime. Dx: E11.9 200 each 3  . letrozole  (FEMARA) 2.5 MG tablet Take 2.5 mg by mouth daily.    Marland Kitchen lisinopril-hydrochlorothiazide (ZESTORETIC) 20-12.5 MG tablet Take 1 tablet by mouth daily. 90 tablet 3   No current facility-administered medications for this visit.     Patient Active Problem List   Diagnosis Date Noted  . 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  . DM (diabetes mellitus), type 2 ([place]) 10/20/2018  . Obesity 10/20/2018  . DKA (diabetic ketoacidoses) ([place]) 10/18/2018  . OSA (obstructive sleep apnea) 10/18/2018  . AKI (acute kidney injury) ([place]) 10/18/2018  . Hypertension   . Breast cancer ([place])   . Hyperglycemia 10/17/2018  . Varicose veins of lower extremities with other complications 19/37/9024   Past Medical History:  Diagnosis Date  . Acid reflux   . Arthritis   . Breast cancer ([place])   . Hypertension   . Obesity,  unspecified   . Pure hyperglyceridemia     Family History  Problem Relation Age of Onset  . Hypertension Mother   . Cancer Father   . Hypertension Father     Past Surgical History:  Procedure Laterality Date  . APPENDECTOMY    . BILATERAL SALPINGOOPHORECTOMY    . CATARACT EXTRACTION Left   . EYE SURGERY Left 07/2008   macro-hole, filled with gel substance at [facility]  . HYSTEROSCOPY W/D&C  12/03/2004  . TONSILLECTOMY    . TUBAL LIGATION Bilateral    Social History   Occupational History  . Not on file  Tobacco Use  . Smoking status: Former Research scientist (life sciences)  . Smokeless tobacco: Never Used  Substance and Sexual Activity  . Alcohol use: No  . Drug use: No  . Sexual activity: Not on file

## 2019-05-03 ENCOUNTER — Ambulatory Visit (INDEPENDENT_AMBULATORY_CARE_PROVIDER_SITE_OTHER): Payer: Medicare Other | Admitting: Nurse Practitioner

## 2019-05-03 ENCOUNTER — Ambulatory Visit

## 2019-05-03 ENCOUNTER — Other Ambulatory Visit: Payer: Self-pay

## 2019-05-03 VITALS — BP 147/83 | HR 67 | Temp 97.3°F | Resp 17 | Wt 256.0 lb

## 2019-05-03 DIAGNOSIS — Z794 Long term (current) use of insulin: Secondary | ICD-10-CM

## 2019-05-03 DIAGNOSIS — I10 Essential (primary) hypertension: Secondary | ICD-10-CM | POA: Diagnosis not present

## 2019-05-03 DIAGNOSIS — E782 Mixed hyperlipidemia: Secondary | ICD-10-CM | POA: Diagnosis not present

## 2019-05-03 DIAGNOSIS — E1165 Type 2 diabetes mellitus with hyperglycemia: Secondary | ICD-10-CM | POA: Diagnosis not present

## 2019-05-03 NOTE — Progress Notes (Signed)
Assessment & Plan:  Emily Gray was seen today for diabetes, hypertension and hyperlipidemia.  Diagnoses and all orders for this visit:  Type 2 diabetes mellitus with hyperglycemia, with long-term current use of insulin (HCC) -     Hemoglobin A1c Continue blood sugar control as discussed in office today, low carbohydrate diet, and regular physical exercise as tolerated, 150 minutes per week (30 min each day, 5 days per week, or 50 min 3 days per week). Keep blood sugar logs with fasting goal of 90-130 mg/dl, post prandial (after you eat) less than 180.  For Hypoglycemia: BS <60 and Hyperglycemia BS >400; contact the clinic ASAP. Annual eye exams and foot exams are recommended.   Essential hypertension -     Basic Metabolic Panel Continue all antihypertensives as prescribed.  Remember to bring in your blood pressure log with you for your follow up appointment.  DASH/Mediterranean Diets are healthier choices for HTN.    Mixed hyperlipidemia INSTRUCTIONS: Work on a low fat, heart healthy diet and participate in regular aerobic exercise program by working out at least 150 minutes per week; 5 days a week-30 minutes per day. Avoid red meat, fried foods. junk foods, sodas, sugary drinks, unhealthy snacking, alcohol and smoking.  Drink at least 48oz of water per day and monitor your carbohydrate intake daily.    Patient has been counseled on age-appropriate routine health concerns for screening and prevention. These are reviewed and up-to-date. Referrals have been placed accordingly. Immunizations are up-to-date or declined.    Subjective:   Chief Complaint  Patient presents with  . Diabetes  . Hypertension  . Hyperlipidemia   HPI Emily Gray 73 y.o. female presents to office today for follow up.  has a past medical history of Acid reflux, Arthritis, Breast cancer ([place]), Hypertension, Obesity, unspecified, and Pure hyperglyceridemia.     DM TYPE 2 Currently well controlled. She is  monitoring her BG twice a day. Average fasting and postprandial 100-130s.  States that she occasionally forgets to administer her 15 units of Lantus. Endorses hyperglycemic symptoms of peripheral neuropathy. Denies hypoglycemic symptoms. Prefers not to take metformin due to symptoms she and her daughter have read about online.  Overdue for eye exam.  Lab Results  Component Value Date   HGBA1C 7.3 (H) 01/30/2019   Essential Hypertension Doesn't check BP at home as she does not have a monitor.  Denies chest pain, shortness of breath, palpitations, lightheadedness, dizziness, headaches or BLE edema.  Currently taking lisinopril-hctz 20-12.5 mg daily as prescribed. She is not diet or exercise compliant. Limited with exercise due to B/L knee OA.  BP Readings from Last 3 Encounters:  05/03/19 (!) 147/83  03/02/19 139/72  01/30/19 (!) 148/87    Hyperlipidemia Patient presents for follow up to hyperlipidemia.  She is medication compliant taking lipitor 20 mg daily. She is not diet compliant and denies statin intolerance including myalgias. LDL not at goal. May need to increase lipitor.  Lab Results  Component Value Date   CHOL 191 01/30/2019   Lab Results  Component Value Date   HDL 38 (L) 01/30/2019   Lab Results  Component Value Date   LDLCALC 133 (H) 01/30/2019   Lab Results  Component Value Date   TRIG 100 01/30/2019   Lab Results  Component Value Date   CHOLHDL 5.0 (H) 01/30/2019   Review of Systems  Constitutional: Negative for fever, malaise/fatigue and weight loss.  HENT: Negative.  Negative for nosebleeds.   Eyes: Negative.  Negative  for blurred vision, double vision and photophobia.  Respiratory: Negative.  Negative for cough and shortness of breath.   Cardiovascular: Negative.  Negative for chest pain, palpitations and leg swelling.  Gastrointestinal: Positive for heartburn (takes OTC medication). Negative for nausea and vomiting.  Musculoskeletal: Positive for joint  pain. Negative for myalgias.  Neurological: Negative.  Negative for dizziness, focal weakness, seizures and headaches.  Psychiatric/Behavioral: Negative.  Negative for suicidal ideas.    Past Medical History:  Diagnosis Date  . Acid reflux   . Arthritis   . Breast cancer ([place])   . Hypertension   . Obesity, unspecified   . Pure hyperglyceridemia     Past Surgical History:  Procedure Laterality Date  . APPENDECTOMY    . BILATERAL SALPINGOOPHORECTOMY    . CATARACT EXTRACTION Left   . EYE SURGERY Left 07/2008   macro-hole, filled with gel substance at [facility]  . HYSTEROSCOPY W/D&C  12/03/2004  . TONSILLECTOMY    . TUBAL LIGATION Bilateral     Family History  Problem Relation Age of Onset  . Hypertension Mother   . Cancer Father   . Hypertension Father     Social History Reviewed with no changes to be made today.   Outpatient Medications Prior to Visit  Medication Sig Dispense Refill  . alendronate (FOSAMAX) 70 MG tablet Take 70 mg by mouth once a week.    Marland Kitchen aspirin 81 MG tablet Take 81 mg by mouth daily.    Marland Kitchen atorvastatin (LIPITOR) 20 MG tablet Take 1 tablet (20 mg total) by mouth daily. 90 tablet 3  . blood glucose meter kit and supplies KIT Dispense based on patient and insurance preference. Use up to four times daily as directed. E11.89 check blood sugar QID and PRN 1 each 0  . cholecalciferol (VITAMIN D3) 25 MCG (1000 UT) tablet Take 1 tablet (1,000 Units total) by mouth daily. 30 tablet 0  . Insulin Glargine (LANTUS SOLOSTAR) 100 UNIT/ML Solostar Pen Inject 15 Units into the skin daily. 15 mL 11  . Insulin Pen Needle 32G X 4 MM MISC Check blood sugars before meals and at bedtime. Dx: E11.9 200 each 3  . letrozole (FEMARA) 2.5 MG tablet Take 2.5 mg by mouth daily.    Marland Kitchen lisinopril-hydrochlorothiazide (ZESTORETIC) 20-12.5 MG tablet Take 1 tablet by mouth daily. 90 tablet 3  . Blood Pressure KIT 1 KIT DISPENSE. PATIENT HAS UNCONTROLLED BP-NEEDS HOME MONITORING  ICD-I10 1 kit  0   No facility-administered medications prior to visit.     No Known Allergies     Objective:    BP (!) 147/83   Pulse 67   Temp (!) 97.3 F (36.3 C) (Temporal)   Resp 17   Wt 256 lb (116.1 kg)   SpO2 95%   BMI 43.94 kg/m  Wt Readings from Last 3 Encounters:  05/03/19 256 lb (116.1 kg)  03/02/19 250 lb (113.4 kg)  01/30/19 251 lb (113.9 kg)    Physical Exam Vitals signs and nursing note reviewed.  Constitutional:      Appearance: She is well-developed.  HENT:     Head: Normocephalic and atraumatic.  Neck:     Musculoskeletal: Normal range of motion.  Cardiovascular:     Rate and Rhythm: Normal rate and regular rhythm.     Heart sounds: Murmur present. No friction rub. No gallop.   Pulmonary:     Effort: Pulmonary effort is normal. No tachypnea or respiratory distress.     Breath sounds: Normal  breath sounds. No decreased breath sounds, wheezing, rhonchi or rales.  Chest:     Chest wall: No tenderness.  Abdominal:     General: Bowel sounds are normal.     Palpations: Abdomen is soft.  Musculoskeletal: Normal range of motion.     Right knee: She exhibits swelling.     Left knee: She exhibits swelling.  Skin:    General: Skin is warm and dry.  Neurological:     Mental Status: She is alert and oriented to person, place, and time.     Coordination: Coordination normal.  Psychiatric:        Behavior: Behavior normal. Behavior is cooperative.        Thought Content: Thought content normal.        Patient has been counseled extensively about nutrition and exercise as well as the importance of adherence with medications and regular follow-up. The patient was given clear instructions to go to ER or return to medical center if symptoms don't improve, worsen or new problems develop. The patient verbalized understanding.   Follow-up: Return in about 3 months (around 08/03/2019).   Gildardo Pounds, FNP-BC [facility] and [place],  [place]   05/03/2019, 3:20 PM

## 2019-05-03 NOTE — Patient Instructions (Signed)
Heartburn Heartburn is a type of pain or discomfort that can happen in the throat or chest. It is often described as a burning pain. It may also cause a bad, acid-like taste in the mouth. Heartburn may feel worse when you lie down or bend over. It may be worse at night. It may be caused by stomach contents that move back up (reflux) into the tube that connects the mouth with the stomach (esophagus). Follow these instructions at home: Eating and drinking   Avoid certain foods and drinks as told by your doctor. This may include: ? Coffee and tea (with or without caffeine). ? Drinks that have alcohol. ? Energy drinks and sports drinks. ? Carbonated drinks or sodas. ? Chocolate and cocoa. ? Peppermint and mint flavorings. ? Garlic and onions. ? Horseradish. ? Spicy and acidic foods, such as:  Peppers.  Chili powder and curry powder.  Vinegar.  Hot sauces and BBQ sauce. ? Citrus fruit juices and citrus fruits, such as:  Oranges.  Lemons.  Limes. ? Tomato-based foods, such as:  Red sauce and pizza with red sauce.  Chili.  Salsa. ? Fried and fatty foods, such as:  Donuts.  French fries and potato chips.  High-fat dressings. ? High-fat meats, such as:  Hot dogs and sausage.  Rib eye steak.  Ham and bacon. ? High-fat dairy items, such as:  Whole milk.  Butter.  Cream cheese.  Eat small meals often. Avoid eating large meals.  Avoid drinking large amounts of liquid with your meals.  Avoid eating meals during the 2-3 hours before bedtime.  Avoid lying down right after you eat.  Do not exercise right after you eat. Lifestyle      If you are overweight, lose an amount of weight that is healthy for you. Ask your doctor about a safe weight loss goal.  Do not use any products that contain nicotine or tobacco, including cigarettes, e-cigarettes, and chewing tobacco. These can make your symptoms worse. If you need help quitting, ask your doctor.  Wear loose  clothes. Do not wear anything tight around your waist.  Raise (elevate) the head of your bed about 6 inches (15 cm) when you sleep.  Try to lower your stress. If you need help doing this, ask your doctor. General instructions  Pay attention to any changes in your symptoms.  Take over-the-counter and prescription medicines only as told by your doctor. ? Do not take aspirin, ibuprofen, or other NSAIDs unless your doctor says it is okay. ? Stop medicines only as told by your doctor.  Keep all follow-up visits as told by your doctor. This is important. Contact a doctor if:  You have new symptoms.  You lose weight and you do not know why it is happening.  You have trouble swallowing, or it hurts to swallow.  You have wheezing or a cough that keeps happening.  Your symptoms do not get better with treatment.  You have heartburn often for more than 2 weeks. Get help right away if:  You have pain in your arms, neck, jaw, teeth, or back.  You feel sweaty, dizzy, or light-headed.  You have chest pain or shortness of breath.  You throw up (vomit) and your throw up looks like blood or coffee grounds.  Your poop (stool) is bloody or black. These symptoms may represent a serious problem that is an emergency. Do not wait to see if the symptoms will go away. Get medical help right away. Call your local   emergency services (911 in the [place] Do not drive yourself to the hospital. Summary  Heartburn is a type of pain that can happen in the throat or chest. It can feel like a burning pain. It may also cause a bad, acid-like taste in the mouth.  You may need to avoid certain foods and drinks to help your symptoms. Ask your doctor what foods and drinks you should avoid.  Take over-the-counter and prescription medicines only as told by your doctor. Do not take aspirin, ibuprofen, or other NSAIDs unless your doctor told you to do so.  Contact your doctor if your symptoms do not get better or  they get worse. This information is not intended to replace advice given to you by your health care provider. Make sure you discuss any questions you have with your health care provider. Document Released: 03/18/2011 Document Revised: 12/06/2017 Document Reviewed: 12/06/2017 Elsevier Patient Education  2020 Elsevier Inc.    Food Choices for Gastroesophageal Reflux Disease, Adult When you have gastroesophageal reflux disease (GERD), the foods you eat and your eating habits are very important. Choosing the right foods can help ease your discomfort. Think about working with a nutrition specialist (dietitian) to help you make good choices. What are tips for following this plan?  Meals  Choose healthy foods that are low in fat, such as fruits, vegetables, whole grains, low-fat dairy products, and lean meat, fish, and poultry.  Eat small meals often instead of 3 large meals a day. Eat your meals slowly, and in a place where you are relaxed. Avoid bending over or lying down until 2-3 hours after eating.  Avoid eating meals 2-3 hours before bed.  Avoid drinking a lot of liquid with meals.  Cook foods using methods other than frying. Bake, grill, or broil food instead.  Avoid or limit: ? Chocolate. ? Peppermint or spearmint. ? Alcohol. ? Pepper. ? Black and decaffeinated coffee. ? Black and decaffeinated tea. ? Bubbly (carbonated) soft drinks. ? Caffeinated energy drinks and soft drinks.  Limit high-fat foods such as: ? Fatty meat or fried foods. ? Whole milk, cream, butter, or ice cream. ? Nuts and nut butters. ? Pastries, donuts, and sweets made with butter or shortening.  Avoid foods that cause symptoms. These foods may be different for everyone. Common foods that cause symptoms include: ? Tomatoes. ? Oranges, lemons, and limes. ? Peppers. ? Spicy food. ? Onions and garlic. ? Vinegar. Lifestyle  Maintain a healthy weight. Ask your doctor what weight is healthy for you. If  you need to lose weight, work with your doctor to do so safely.  Exercise for at least 30 minutes for 5 or more days each week, or as told by your doctor.  Wear loose-fitting clothes.  Do not smoke. If you need help quitting, ask your doctor.  Sleep with the head of your bed higher than your feet. Use a wedge under the mattress or blocks under the bed frame to raise the head of the bed. Summary  When you have gastroesophageal reflux disease (GERD), food and lifestyle choices are very important in easing your symptoms.  Eat small meals often instead of 3 large meals a day. Eat your meals slowly, and in a place where you are relaxed.  Limit high-fat foods such as fatty meat or fried foods.  Avoid bending over or lying down until 2-3 hours after eating.  Avoid peppermint and spearmint, caffeine, alcohol, and chocolate. This information is not intended to replace advice given   to you by your health care provider. Make sure you discuss any questions you have with your health care provider. Document Released: 01/05/2012 Document Revised: 10/27/2018 Document Reviewed: 08/11/2016 Elsevier Patient Education  2020 Elsevier Inc.  

## 2019-05-04 LAB — BASIC METABOLIC PANEL
BUN/Creatinine Ratio: 21 (ref 12–28)
BUN: 19 mg/dL (ref 8–27)
CO2: 24 mmol/L (ref 20–29)
Calcium: 9.7 mg/dL (ref 8.7–10.3)
Chloride: 103 mmol/L (ref 96–106)
Creatinine, Ser: 0.89 mg/dL (ref 0.57–1.00)
GFR calc Af Amer: 75 mL/min/{1.73_m2} (ref >59)
GFR calc non Af Amer: 65 mL/min/{1.73_m2} (ref >59)
Glucose: 121 mg/dL — ABNORMAL HIGH (ref 65–99)
Potassium: 4 mmol/L (ref 3.5–5.2)
Sodium: 142 mmol/L (ref 134–144)

## 2019-05-04 LAB — HEMOGLOBIN A1C
Est. average glucose Bld gHb Est-mCnc: 157 mg/dL
Hgb A1c MFr Bld: 7.1 % — ABNORMAL HIGH (ref 4.8–5.6)

## 2019-05-06 ENCOUNTER — Other Ambulatory Visit: Payer: Self-pay | Admitting: Family Medicine

## 2019-06-07 ENCOUNTER — Ambulatory Visit (INDEPENDENT_AMBULATORY_CARE_PROVIDER_SITE_OTHER): Payer: Medicare Other | Admitting: Physician Assistant

## 2019-06-07 ENCOUNTER — Other Ambulatory Visit: Payer: Self-pay

## 2019-06-07 VITALS — BP 177/92 | HR 74 | Temp 97.3°F | Resp 18 | Wt 264.8 lb

## 2019-06-07 DIAGNOSIS — E1165 Type 2 diabetes mellitus with hyperglycemia: Secondary | ICD-10-CM | POA: Diagnosis not present

## 2019-06-07 DIAGNOSIS — I10 Essential (primary) hypertension: Secondary | ICD-10-CM | POA: Diagnosis not present

## 2019-06-07 DIAGNOSIS — M545 Low back pain, unspecified: Secondary | ICD-10-CM

## 2019-06-07 DIAGNOSIS — Z794 Long term (current) use of insulin: Secondary | ICD-10-CM | POA: Diagnosis not present

## 2019-06-07 DIAGNOSIS — R399 Unspecified symptoms and signs involving the genitourinary system: Secondary | ICD-10-CM | POA: Diagnosis not present

## 2019-06-07 LAB — POCT URINALYSIS DIP (CLINITEK)
Bilirubin, UA: NEGATIVE
Blood, UA: NEGATIVE
Glucose, UA: NEGATIVE mg/dL
Ketones, POC UA: NEGATIVE mg/dL
Nitrite, UA: NEGATIVE
POC PROTEIN,UA: NEGATIVE
Spec Grav, UA: 1.02 (ref 1.010–1.025)
Urobilinogen, UA: 4 E.U./dL — AB
pH, UA: 7 (ref 5.0–8.0)

## 2019-06-07 LAB — GLUCOSE, POCT (MANUAL RESULT ENTRY): POC Glucose: 116 mg/dl — AB (ref 70–99)

## 2019-06-07 MED ORDER — NITROFURANTOIN MONOHYD MACRO 100 MG PO CAPS: 100.0000 mg | ORAL_CAPSULE | Freq: Two times a day (BID) | ORAL | 0 refills | Status: DC

## 2019-06-07 MED ORDER — FLUCONAZOLE 150 MG PO TABS: 150.0000 mg | ORAL_TABLET | Freq: Once | ORAL | 0 refills | Status: AC

## 2019-06-07 MED ORDER — MELOXICAM 7.5 MG PO TABS: 7.5000 mg | ORAL_TABLET | Freq: Every day | ORAL | 0 refills | Status: DC

## 2019-06-07 NOTE — Progress Notes (Signed)
Here with c/o UTI symptoms(lower back pain, cloudy urine, pain with urination, odor) x 6 days. Denies nausea, vomiting, flank pain, fever, chills.  States blood sugars have been in the 120s-130s.

## 2019-06-07 NOTE — Progress Notes (Signed)
Patient ID: Emily Gray, female   DOB: 12-25-1945, 73 y.o.   MRN: [id number]   Emily Gray, is a 73 y.o. female  [id number]  [id number]  DOB - 02/22/1946  Subjective:  Chief Complaint and HPI: Emily Gray is a 73 y.o. female here today C/o 9 day h/o frequency with urination. No dysuria. Some LBP.  No fever.  No abdominal pain.  Not taking anything for discomfort other than occasional tylenol.  Her husband is disabled and she cares for him.  She feels she may have pulled her lower back but doesn't remember a specific injury.  No radiating pain.  slowere to get up and down from sitting due to pain.  Pain is in the middle of her back/not flank/kidney pain   ROS:   Constitutional:  No f/c, No night sweats, No unexplained weight loss. EENT:  No vision changes, No blurry vision, No hearing changes. No mouth, throat, or ear problems.  Respiratory: No cough, No SOB Cardiac: No CP, no palpitations GI:  No abd pain, No N/V/D. GU: see above Musculoskeletal: +LBP Neuro: No headache, no dizziness, no motor weakness.  Skin: No rash Endocrine:  No polydipsia. No polyuria.  Psych: Denies SI/HI  No problems updated.  ALLERGIES: No Known Allergies  PAST MEDICAL HISTORY: Past Medical History:  Diagnosis Date  . Acid reflux   . Arthritis   . Breast cancer ([place])   . Hypertension   . Obesity, unspecified   . Pure hyperglyceridemia     MEDICATIONS AT HOME: Prior to Admission medications   Medication Sig Start Date End Date Taking? Authorizing Provider  alendronate (FOSAMAX) 70 MG tablet Take 70 mg by mouth once a week. 08/05/18  Yes [provider]  aspirin 81 MG tablet Take 81 mg by mouth daily.   Yes [provider]  atorvastatin (LIPITOR) 20 MG tablet Take 1 tablet (20 mg total) by mouth daily. 01/30/19  Yes Scot Jun, FNP  cholecalciferol (VITAMIN D3) 25 MCG (1000 UT) tablet Take 1 tablet (1,000 Units total) by mouth daily. 10/20/18  Yes Debbe Odea,  MD  Insulin Glargine (LANTUS SOLOSTAR) 100 UNIT/ML Solostar Pen Inject 15 Units into the skin daily. 02/03/19  Yes Scot Jun, FNP  Insulin Pen Needle 32G X 4 MM MISC Check blood sugars before meals and at bedtime. Dx: E11.9 12/08/18  Yes Scot Jun, FNP  letrozole [facility]) 2.5 MG tablet Take 2.5 mg by mouth daily. 07/17/18  Yes [provider]  lisinopril-hydrochlorothiazide (ZESTORETIC) 20-12.5 MG tablet Take 1 tablet by mouth daily. 11/28/18  Yes Scot Jun, FNP  fluconazole (DIFLUCAN) 150 MG tablet Take 1 tablet (150 mg total) by mouth once for 1 dose. If needed for yeast after antibiotics 06/07/19 06/07/19  Argentina Donovan, PA-C  meloxicam (MOBIC) 7.5 MG tablet Take 1 tablet (7.5 mg total) by mouth daily. Prn pain 06/07/19   Argentina Donovan, PA-C  nitrofurantoin, macrocrystal-monohydrate, (MACROBID) 100 MG capsule Take 1 capsule (100 mg total) by mouth 2 (two) times daily. 06/07/19   Argentina Donovan, PA-C     Objective:  EXAM:   Vitals:   06/07/19 1340  BP: (!) 190/96  Pulse: 74  Resp: 18  Temp: (!) 97.3 F (36.3 C)  TempSrc: Temporal  SpO2: 96%  Weight: 264 lb 12.8 oz (120.1 kg)    General appearance : A&OX3. NAD. Non-toxic-appearing HEENT: Atraumatic and Normocephalic.  PERRLA. EOM intact.  Chest/Lungs:  Breathing-non-labored, Good air entry bilaterally, breath sounds normal  without rales, rhonchi, or wheezing  CVS: S1 S2 regular, no murmurs, gallops, rubs  Back:  Pain is mid low back  ROM limited due to pain and obesity.  Walks with a cane.  Neg SLR.   Extremities: Bilateral Lower Ext shows no edema, both legs are warm to touch with = pulse throughout Neurology:  CN II-XII grossly intact, Non focal.   Psych:  TP linear. J/I WNL. Normal speech. Appropriate eye contact and affect.  Skin:  No Rash  Data Review Lab Results  Component Value Date   HGBA1C 7.1 (H) 05/03/2019   HGBA1C 7.3 (H) 01/30/2019   HGBA1C 10.5 (H) 12/06/2018      Assessment & Plan   1. Type 2 diabetes mellitus with hyperglycemia, with long-term current use of insulin (HCC) controlled - Glucose (CBG) - POCT URINALYSIS DIP (CLINITEK)  2. UTI symptoms - POCT URINALYSIS DIP (CLINITEK) - Urine culture - nitrofurantoin, macrocrystal-monohydrate, (MACROBID) 100 MG capsule; Take 1 capsule (100 mg total) by mouth 2 (two) times daily.  Dispense: 10 capsule; Refill: 0  3. Low back pain, unspecified back pain laterality, unspecified chronicity, unspecified whether sciatica present - meloxicam (MOBIC) 7.5 MG tablet; Take 1 tablet (7.5 mg total) by mouth daily. Prn pain  Dispense: 30 tablet; Refill: 0  4.  htn-didn't take meds and in pain.  Take meds daily.  Check BP OOO and record and bring to next visit.    Patient have been counseled extensively about nutrition and exercise  Return for 08/09/2019 appt as scheduled.  The patient was given clear instructions to go to ER or return to medical center if symptoms don't improve, worsen or new problems develop. The patient verbalized understanding. The patient was told to call to get lab results if they haven't heard anything in the next week.     Freeman Caldron, PA-C [facility] and [place], [place]   06/07/2019, 2:04 PM

## 2019-06-09 ENCOUNTER — Other Ambulatory Visit: Payer: Self-pay

## 2019-06-09 ENCOUNTER — Encounter (HOSPITAL_COMMUNITY): Payer: Self-pay

## 2019-06-09 ENCOUNTER — Ambulatory Visit (HOSPITAL_COMMUNITY)
Admission: EM | Admit: 2019-06-09 | Discharge: 2019-06-09 | Disposition: A | Discharge status: Home / Self Care | Payer: Medicare Other | Attending: Emergency Medicine | Admitting: Emergency Medicine

## 2019-06-09 DIAGNOSIS — M545 Low back pain, unspecified: Secondary | ICD-10-CM

## 2019-06-09 LAB — URINE CULTURE

## 2019-06-09 MED ORDER — TIZANIDINE HCL 2 MG PO TABS: 2.0000 mg | ORAL_TABLET | Freq: Every day | ORAL | 0 refills | Status: DC

## 2019-06-09 NOTE — ED Provider Notes (Signed)
[place]    CSN: [id number] Arrival date & time: 06/09/19  1713      History   Chief Complaint Chief Complaint  Patient presents with   Back Pain    HPI Emily Gray is a 73 y.o. female.   Emily Gray presents with complaints of midline low back pain which started one week ago, as well as some left knee pain. She had tried to help her husband after he fell in the shower, in which he then fell onto her and she fell. Pain worse with different positions and activity. Transitions of positions increase pain. No numbness tingling or weakness to lower legs. No saddle symptoms. No loss of bladder or bowel function. No urinary symptoms. She went to her PCP two days ago. Was worked up, found to have UTI and started on macrobid. meloxicam was also initiated. Patient states she has not taken this as she didn't want to take / mix medications. She has not taken anything for pain. History  Of arthritis, reflux, htn, obesity.   ROS per HPI, negative if not otherwise mentioned.      Past Medical History:  Diagnosis Date   Acid reflux    Arthritis    Breast cancer ([place])    Hypertension    Obesity, unspecified    Pure hyperglyceridemia     Patient Active Problem List   Diagnosis Date Noted   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   DM (diabetes mellitus), type 2 ([place]) 10/20/2018   Obesity 10/20/2018   DKA (diabetic ketoacidoses) ([place]) 10/18/2018   OSA (obstructive sleep apnea) 10/18/2018   AKI (acute kidney injury) ([place]) 10/18/2018   Hypertension    Breast cancer ([place])    Hyperglycemia 10/17/2018   Varicose veins of lower extremities with other complications [id number]    Past Surgical History:  Procedure Laterality Date   APPENDECTOMY     BILATERAL SALPINGOOPHORECTOMY     CATARACT EXTRACTION Left    EYE SURGERY Left 07/2008   macro-hole, filled with gel substance at [facility]   HYSTEROSCOPY W/D&C  12/03/2004   TONSILLECTOMY      TUBAL LIGATION Bilateral     OB History   No obstetric history on file.      Home Medications    Prior to Admission medications   Medication Sig Start Date End Date Taking? Authorizing Provider  alendronate (FOSAMAX) 70 MG tablet Take 70 mg by mouth once a week. 08/05/18   [provider]  aspirin 81 MG tablet Take 81 mg by mouth daily.    [provider]  atorvastatin (LIPITOR) 20 MG tablet Take 1 tablet (20 mg total) by mouth daily. 01/30/19   Scot Jun, FNP  cholecalciferol (VITAMIN D3) 25 MCG (1000 UT) tablet Take 1 tablet (1,000 Units total) by mouth daily. 10/20/18   Debbe Odea, MD  Insulin Glargine (LANTUS SOLOSTAR) 100 UNIT/ML Solostar Pen Inject 15 Units into the skin daily. 02/03/19   Scot Jun, FNP  Insulin Pen Needle 32G X 4 MM MISC Check blood sugars before meals and at bedtime. Dx: E11.9 12/08/18   Scot Jun, FNP  letrozole [facility]) 2.5 MG tablet Take 2.5 mg by mouth daily. 07/17/18   [provider]  lisinopril-hydrochlorothiazide (ZESTORETIC) 20-12.5 MG tablet Take 1 tablet by mouth daily. 11/28/18   Scot Jun, FNP  meloxicam (MOBIC) 7.5 MG tablet Take 1 tablet (7.5 mg total) by mouth daily. Prn pain 06/07/19   Argentina Donovan, PA-C  nitrofurantoin, macrocrystal-monohydrate, (MACROBID) 100 MG capsule Take 1 capsule (100 mg total) by mouth 2 (two) times daily. 06/07/19   Argentina Donovan, PA-C  tiZANidine (ZANAFLEX) 2 MG tablet Take 1 tablet (2 mg total) by mouth at bedtime. 06/09/19   Zigmund Gottron, NP    Family History Family History  Problem Relation Age of Onset   Hypertension Mother    Cancer Father    Hypertension Father     Social History Social History   Tobacco Use   Smoking status: Former Smoker   Smokeless tobacco: Never Used  Substance Use Topics   Alcohol use: No   Drug use: No     Allergies   Patient has no known allergies.   Review of Systems Review of  Systems   Physical Exam Triage Vital Signs ED Triage Vitals  Enc Vitals Group     BP 06/09/19 1816 (!) 164/80     Pulse Rate 06/09/19 1816 80     Resp 06/09/19 1816 16     Temp 06/09/19 1816 98.1 F (36.7 C)     Temp Source 06/09/19 1816 Oral     SpO2 06/09/19 1816 100 %     Weight --      Height --      Head Circumference --      [place] --      Pain Score 06/09/19 1810 10     Pain Loc --      Pain Edu? --      Excl. in [place]? --    No data found.  Updated Vital Signs BP (!) 164/80 (BP Location: Left Arm)    Pulse 80    Temp 98.1 F (36.7 C) (Oral)    Resp 16    SpO2 100%   Visual Acuity Right Eye Distance:   Left Eye Distance:   Bilateral Distance:    Right Eye Near:   Left Eye Near:    Bilateral Near:     Physical Exam Constitutional:      General: She is not in acute distress.    Appearance: She is well-developed.  Cardiovascular:     Rate and Rhythm: Normal rate.  Pulmonary:     Effort: Pulmonary effort is normal.  Musculoskeletal:     Lumbar back: She exhibits decreased range of motion, tenderness, bony tenderness, swelling and pain. She exhibits no edema, no deformity, no laceration, no spasm and normal pulse.     Comments: Lumbar back and spine with tenderness; no step off or deformity to spinous processes; pain with transition from sit to lay and lay to sit; no pain with hip flexion or straight leg raise; ambulatory with can without difficulty; strength equal bilaterally; gross sensation intact to lower extremities   Skin:    General: Skin is warm and dry.  Neurological:     Mental Status: She is alert and oriented to person, place, and time.      UC Treatments / Results  Labs (all labs ordered are listed, but only abnormal results are displayed) Labs Reviewed - No data to display  EKG   Radiology No results found.  Procedures Procedures (including critical care time)  Medications Ordered in UC Medications - No data to display  Initial  Impression / Assessment and Plan / UC Course  I have reviewed the triage vital signs and the nursing notes.  Pertinent labs & imaging results that were available during my care of the patient were reviewed by me  and considered in my medical decision making (see chart for details).     Pain management and medication education discussed at length with patient, written in AVS and on her pharmacy form she had brought with her to reiterate medications to be taking and timing. No red flag findings here tonight. Lumbar strain. Encouraged continued follow up with PCP. Return precautions provided. Patient verbalized understanding and agreeable to plan.  Ambulatory out of clinic without difficulty.    Final Clinical Impressions(s) / UC Diagnoses   Final diagnoses:  Acute midline low back pain without sciatica     Discharge Instructions     Please start Meloxicam which was prescribed to you two days ago. Take with food. Take this once a day for your back and knee pain. May stop taking once the pain has resolved.  You can take the Diflucan if you develop vaginal yeast symptoms.  Complete the course of Nitrofurantoin, this is the antibiotic for UTI.  I have sent a muscle relaxer to the [facility]. You may take this at night. May cause drowsiness. Please do not take if driving or drinking alcohol.   Please follow up for recheck with your Pcp in the next two weeks if symptoms persist.  Please go to the ER if pain worsens, develop weakness, numbness tingling, or loss of control of your bladder or bowel.    ED Prescriptions    Medication Sig Dispense Auth. Provider   tiZANidine (ZANAFLEX) 2 MG tablet  (Status: Discontinued) Take 1 tablet (2 mg total) by mouth at bedtime. 20 tablet Augusto Gamble B, NP   tiZANidine (ZANAFLEX) 2 MG tablet Take 1 tablet (2 mg total) by mouth at bedtime. 20 tablet Zigmund Gottron, NP     PDMP not reviewed this encounter.   Zigmund Gottron, NP 06/09/19 2207

## 2019-06-09 NOTE — Discharge Instructions (Addendum)
Please start Meloxicam which was prescribed to you two days ago. Take with food. Take this once a day for your back and knee pain. May stop taking once the pain has resolved.  You can take the Diflucan if you develop vaginal yeast symptoms.  Complete the course of Nitrofurantoin, this is the antibiotic for UTI.  I have sent a muscle relaxer to the [facility]. You may take this at night. May cause drowsiness. Please do not take if driving or drinking alcohol.   Please follow up for recheck with your Pcp in the next two weeks if symptoms persist.  Please go to the ER if pain worsens, develop weakness, numbness tingling, or loss of control of your bladder or bowel.

## 2019-06-09 NOTE — ED Triage Notes (Signed)
Patient presents to Urgent Care with complaints of lower back pain since 9 days ago. Patient reports her husband fell and she thinks she hurt it when she helped her husband off the floor.

## 2019-08-08 ENCOUNTER — Telehealth: Payer: Self-pay

## 2019-08-08 NOTE — Telephone Encounter (Signed)

## 2019-08-09 ENCOUNTER — Ambulatory Visit (INDEPENDENT_AMBULATORY_CARE_PROVIDER_SITE_OTHER): Payer: Medicare Other | Admitting: Nurse Practitioner

## 2019-08-09 ENCOUNTER — Other Ambulatory Visit: Payer: Self-pay

## 2019-08-09 VITALS — BP 146/86 | HR 78 | Temp 97.3°F | Resp 17 | Wt 255.0 lb

## 2019-08-09 DIAGNOSIS — E1165 Type 2 diabetes mellitus with hyperglycemia: Secondary | ICD-10-CM

## 2019-08-09 DIAGNOSIS — I10 Essential (primary) hypertension: Secondary | ICD-10-CM | POA: Diagnosis not present

## 2019-08-09 DIAGNOSIS — Z794 Long term (current) use of insulin: Secondary | ICD-10-CM

## 2019-08-09 DIAGNOSIS — Z130 Encounter for screening for diseases of the blood and blood-forming organs and certain disorders involving the immune mechanism: Secondary | ICD-10-CM

## 2019-08-09 DIAGNOSIS — R718 Other abnormality of red blood cells: Secondary | ICD-10-CM

## 2019-08-09 DIAGNOSIS — E782 Mixed hyperlipidemia: Secondary | ICD-10-CM | POA: Diagnosis not present

## 2019-08-09 LAB — POCT GLYCOSYLATED HEMOGLOBIN (HGB A1C): Hemoglobin A1C: 7.8 % — AB (ref 4.0–5.6)

## 2019-08-09 NOTE — Patient Instructions (Addendum)
[facility] Address: [address], [place], [place] [address] Phone: [phone]    CBD oils

## 2019-08-09 NOTE — Progress Notes (Signed)
Assessment & Plan:  Emily Gray was seen today for diabetes, hypertension and hyperlipidemia.  Diagnoses and all orders for this visit:  Type 2 diabetes mellitus with hyperglycemia, with long-term current use of insulin (HCC) -     HgB A1c Continue blood sugar control as discussed in office today, low carbohydrate diet, and regular physical exercise as tolerated, 150 minutes per week (30 min each day, 5 days per week, or 50 min 3 days per week). Keep blood sugar logs with fasting goal of 90-130 mg/dl, post prandial (after you eat) less than 180.  For Hypoglycemia: BS <60 and Hyperglycemia BS >400; contact the clinic ASAP. Annual eye exams and foot exams are recommended.   Essential hypertension She will send me a log of her home blood pressure readings over the next week.  I will average those blood pressures at that time and decide if she needs an additional antihypertensive. -     CMP14+EGFR Continue all antihypertensives as prescribed.  Remember to bring in your blood pressure log with you for your follow up appointment.  DASH/Mediterranean Diets are healthier choices for HTN.   Mixed hyperlipidemia -     Lipid panel INSTRUCTIONS: Work on a low fat, heart healthy diet and participate in regular aerobic exercise program by working out at least 150 minutes per week; 5 days a week-30 minutes per day. Avoid red meat/beef/steak,  fried foods. junk foods, sodas, sugary drinks, unhealthy snacking, alcohol and smoking.  Drink at least 80 oz of water per day and monitor your carbohydrate intake daily.   Screening for deficiency anemia -     CBC  RBC abnormality -     CBC    Patient has been counseled on age-appropriate routine health concerns for screening and prevention. These are reviewed and up-to-date. Referrals have been placed accordingly. Immunizations are up-to-date or declined.    Subjective:   Chief Complaint  Patient presents with  . Diabetes    fasting readings have been in  the 120s  . Hypertension  . Hyperlipidemia   HPI Emily Gray 74 y.o. female presents to office today for follow up.  has a past medical history of Acid reflux, Arthritis, Breast cancer ([place]), Hypertension, Obesity, unspecified, and Pure hyperglyceridemia.   HEALTH MAINTENANCE  She is overdue for mammogram.  She has them performed in [employer] and will be calling the breast center in [place] to schedule.   Hypertension She is exercising has lost 9lbs since last office visit. She is adherent to low salt diet.  She does not have a blood pressure log today.  Blood pressure is elevated today however she reports blood pressure is well controlled at home. Denies chest pain, shortness of breath, palpitations, lightheadedness, dizziness, headaches or BLE edema.  Endorses medication compliance taking lisinopril-hydrochlorothiazide 20-12.5 mg daily. BP Readings from Last 3 Encounters:  08/09/19 (!) 163/87  06/09/19 (!) 164/80  06/07/19 (!) 177/92    Hyperlipidemia Patient presents for follow up to hyperlipidemia.  She is medication compliant taking atorvastatin 20 mg daily as prescribed. She is not consistently diet compliant and denies statin intolerance including myalgias.  LDL not at goal of less than 70. Lab Results  Component Value Date   CHOL 191 01/30/2019   Lab Results  Component Value Date   HDL 38 (L) 01/30/2019   Lab Results  Component Value Date   LDLCALC 133 (H) 01/30/2019   Lab Results  Component Value Date   TRIG 100 01/30/2019   Lab Results  Component Value Date   CHOLHDL 5.0 (H) 01/30/2019      Diabetes Mellitus Type II  A1c has increased today from 7.1-7.8.  At this time we will continue her on Lantus 15 units daily.  She denies any symptoms of hypo or hyperglycemia. Current diabetic medications include:  Eye exam current (within one year): no Weight trend: decreasing steadily Current monitoring regimen: home blood tests - 1-2 times daily Home blood sugar  records: fasting range: 120s Any episodes of hypoglycemia? no Is She on ACE inhibitor or angiotensin II receptor blocker?  Yes  Lab Results  Component Value Date   HGBA1C 7.8 (A) 08/09/2019   HGBA1C 7.1 (H) 05/03/2019   HGBA1C 7.3 (H) 01/30/2019       Review of Systems  Constitutional: Negative for fever, malaise/fatigue and weight loss.  HENT: Negative.  Negative for nosebleeds.   Eyes: Negative.  Negative for blurred vision, double vision and photophobia.  Respiratory: Negative.  Negative for cough and shortness of breath.   Cardiovascular: Negative.  Negative for chest pain, palpitations and leg swelling.  Gastrointestinal: Negative.  Negative for heartburn, nausea and vomiting.  Musculoskeletal: Negative.  Negative for myalgias.  Neurological: Negative.  Negative for dizziness, focal weakness, seizures and headaches.  Psychiatric/Behavioral: Negative.  Negative for suicidal ideas.    Past Medical History:  Diagnosis Date  . Acid reflux   . Arthritis   . Breast cancer ([place])   . Hypertension   . Obesity, unspecified   . Pure hyperglyceridemia     Past Surgical History:  Procedure Laterality Date  . APPENDECTOMY    . BILATERAL SALPINGOOPHORECTOMY    . CATARACT EXTRACTION Left   . EYE SURGERY Left 07/2008   macro-hole, filled with gel [facility] at [facility]  . HYSTEROSCOPY WITH D & C  12/03/2004  . TONSILLECTOMY    . TUBAL LIGATION Bilateral     Family History  Problem Relation Age of Onset  . Hypertension Mother   . Cancer Father   . Hypertension Father     Social History Reviewed with no changes to be made today.   Outpatient Medications Prior to Visit  Medication Sig Dispense Refill  . alendronate (FOSAMAX) 70 MG tablet Take 70 mg by mouth once a week.    Marland Kitchen aspirin 81 MG tablet Take 81 mg by mouth daily.    Marland Kitchen atorvastatin (LIPITOR) 20 MG tablet Take 1 tablet (20 mg total) by mouth daily. 90 tablet 3  . cholecalciferol (VITAMIN D3) 25 MCG (1000 UT) tablet  Take 1 tablet (1,000 Units total) by mouth daily. 30 tablet 0  . Insulin Glargine (LANTUS SOLOSTAR) 100 UNIT/ML Solostar Pen Inject 15 Units into the skin daily. 15 mL 11  . Insulin Pen Needle 32G X 4 MM MISC Check blood sugars before meals and at bedtime. Dx: E11.9 200 each 3  . letrozole (FEMARA) 2.5 MG tablet Take 2.5 mg by mouth daily.    Marland Kitchen lisinopril-hydrochlorothiazide (ZESTORETIC) 20-12.5 MG tablet Take 1 tablet by mouth daily. 90 tablet 3  . meloxicam (MOBIC) 7.5 MG tablet Take 1 tablet (7.5 mg total) by mouth daily. Prn pain 30 tablet 0  . tiZANidine (ZANAFLEX) 2 MG tablet Take 1 tablet (2 mg total) by mouth at bedtime. 20 tablet 0   No facility-administered medications prior to visit.    No Known Allergies     Objective:    BP (!) 163/87   Pulse 78   Temp (!) 97.3 F (36.3 C) (Temporal)  Resp 17   Wt 255 lb (115.7 kg)   SpO2 94%   BMI 43.77 kg/m  Wt Readings from Last 3 Encounters:  08/09/19 255 lb (115.7 kg)  06/07/19 264 lb 12.8 oz (120.1 kg)  05/03/19 256 lb (116.1 kg)    Physical Exam Vitals and nursing note reviewed.  Constitutional:      Appearance: She is well-developed.  HENT:     Head: Normocephalic and atraumatic.  Cardiovascular:     Rate and Rhythm: Normal rate and regular rhythm.     Heart sounds: Normal heart sounds. No murmur. No friction rub. No gallop.   Pulmonary:     Effort: Pulmonary effort is normal. No tachypnea or respiratory distress.     Breath sounds: Normal breath sounds. No decreased breath sounds, wheezing, rhonchi or rales.  Chest:     Chest wall: No tenderness.  Abdominal:     General: Bowel sounds are normal.     Palpations: Abdomen is soft.  Musculoskeletal:        General: Normal range of motion.     Cervical back: Normal range of motion.  Skin:    General: Skin is warm and dry.  Neurological:     Mental Status: She is alert and oriented to person, place, and time.     Coordination: Coordination normal.  Psychiatric:         Behavior: Behavior normal. Behavior is cooperative.        Thought Content: Thought content normal.        Judgment: Judgment normal.        Patient has been counseled extensively about nutrition and exercise as well as the importance of adherence with medications and regular follow-up. The patient was given clear instructions to go to ER or return to medical center if symptoms don't improve, worsen or new problems develop. The patient verbalized understanding.   Follow-up: Return in about 3 months (around 11/07/2019).   Gildardo Pounds, FNP-BC [facility] [place] [facility], [place]   08/09/2019, 3:49 PM

## 2019-08-10 LAB — LIPID PANEL
Chol/HDL Ratio: 3.7 ratio (ref 0.0–4.4)
Cholesterol, Total: 133 mg/dL (ref 100–199)
HDL: 36 mg/dL — ABNORMAL LOW (ref >39)
LDL Chol Calc (NIH): 83 mg/dL (ref 0–99)
Triglycerides: 69 mg/dL (ref 0–149)
VLDL Cholesterol Cal: 14 mg/dL (ref 5–40)

## 2019-08-10 LAB — CMP14+EGFR
ALT: 21 IU/L (ref 0–32)
AST: 21 IU/L (ref 0–40)
Albumin/Globulin Ratio: 1.6 (ref 1.2–2.2)
Albumin: 4.2 g/dL (ref 3.7–4.7)
Alkaline Phosphatase: 123 IU/L — ABNORMAL HIGH (ref 39–117)
BUN/Creatinine Ratio: 20 (ref 12–28)
BUN: 12 mg/dL (ref 8–27)
Bilirubin Total: 0.4 mg/dL (ref 0.0–1.2)
CO2: 24 mmol/L (ref 20–29)
Calcium: 9.3 mg/dL (ref 8.7–10.3)
Chloride: 101 mmol/L (ref 96–106)
Creatinine, Ser: 0.6 mg/dL (ref 0.57–1.00)
GFR calc Af Amer: 105 mL/min/{1.73_m2} (ref >59)
GFR calc non Af Amer: 91 mL/min/{1.73_m2} (ref >59)
Globulin, Total: 2.6 g/dL (ref 1.5–4.5)
Glucose: 139 mg/dL — ABNORMAL HIGH (ref 65–99)
Potassium: 3.8 mmol/L (ref 3.5–5.2)
Sodium: 139 mmol/L (ref 134–144)
Total Protein: 6.8 g/dL (ref 6.0–8.5)

## 2019-08-10 LAB — CBC
Hematocrit: 38.5 % (ref 34.0–46.6)
Hemoglobin: 13 g/dL (ref 11.1–15.9)
MCH: 28 pg (ref 26.6–33.0)
MCHC: 33.8 g/dL (ref 31.5–35.7)
MCV: 83 fL (ref 79–97)
Platelets: 267 10*3/uL (ref 150–450)
RBC: 4.64 x10E6/uL (ref 3.77–5.28)
RDW: 13.6 % (ref 11.7–15.4)
WBC: 6.7 10*3/uL (ref 3.4–10.8)

## 2019-11-10 ENCOUNTER — Telehealth: Payer: Medicare Other

## 2019-11-30 DIAGNOSIS — Z6841 Body Mass Index (BMI) 40.0 and over, adult: Secondary | ICD-10-CM | POA: Diagnosis not present

## 2019-11-30 DIAGNOSIS — G4733 Obstructive sleep apnea (adult) (pediatric): Secondary | ICD-10-CM | POA: Diagnosis not present

## 2019-11-30 DIAGNOSIS — I10 Essential (primary) hypertension: Secondary | ICD-10-CM | POA: Diagnosis not present

## 2019-12-06 ENCOUNTER — Other Ambulatory Visit: Payer: Self-pay | Admitting: Family Medicine

## 2019-12-11 ENCOUNTER — Other Ambulatory Visit: Payer: Self-pay | Admitting: Family Medicine

## 2020-01-19 ENCOUNTER — Other Ambulatory Visit: Payer: Self-pay

## 2020-01-19 MED ORDER — LISINOPRIL-HYDROCHLOROTHIAZIDE 20-12.5 MG PO TABS: 1.0000 | ORAL_TABLET | Freq: Every day | ORAL | 0 refills | Status: DC

## 2020-02-01 ENCOUNTER — Telehealth (INDEPENDENT_AMBULATORY_CARE_PROVIDER_SITE_OTHER): Payer: Medicare Other | Admitting: Internal Medicine

## 2020-02-01 ENCOUNTER — Encounter: Payer: Self-pay | Admitting: Internal Medicine

## 2020-02-01 DIAGNOSIS — I10 Essential (primary) hypertension: Secondary | ICD-10-CM

## 2020-02-01 DIAGNOSIS — Z794 Long term (current) use of insulin: Secondary | ICD-10-CM | POA: Diagnosis not present

## 2020-02-01 DIAGNOSIS — Z1159 Encounter for screening for other viral diseases: Secondary | ICD-10-CM | POA: Diagnosis not present

## 2020-02-01 DIAGNOSIS — E1165 Type 2 diabetes mellitus with hyperglycemia: Secondary | ICD-10-CM

## 2020-02-01 DIAGNOSIS — E782 Mixed hyperlipidemia: Secondary | ICD-10-CM | POA: Diagnosis not present

## 2020-02-01 MED ORDER — LANTUS SOLOSTAR 100 UNIT/ML ~~LOC~~ SOPN: 24.0000 [IU] | PEN_INJECTOR | Freq: Every day | SUBCUTANEOUS | 11 refills | Status: DC

## 2020-02-01 NOTE — Progress Notes (Signed)
Virtual Visit via Telephone Note  I connected with Vassie Loll, on 02/01/2020 at 3:53 PM by telephone due to the COVID-19 pandemic and verified that I am speaking with the correct person using two identifiers.   Consent: I discussed the limitations, risks, security and privacy concerns of performing an evaluation and management service by telephone and the availability of in person appointments. I also discussed with the patient that there may be a patient responsible charge related to this service. The patient expressed understanding and agreed to proceed.   Location of Patient: Home   Location of Provider: Clinic    Persons participating in Telemedicine visit: Kassity [facility] Dr. Juleen China    History of Present Illness: Patient has a visit to follow up on chronic medical conditions.   Diabetes mellitus, Type 2 Disease Monitoring             Blood Sugar Ranges: Fasting -190s on average              Polyuria: no             Visual problems: no   Urine Microalbumin: needs to be collected   Last A1C: 7.8 (Jan 2021)   Medications: Lantus 22 units per day, Novolog SSI  Medication Compliance: yes  Medication Side Effects             Hypoglycemia: no   Walking on a treadmill after dinner nightly. Says doing well with her diet, doesn't eat bread like she used to.    Chronic HTN Disease Monitoring:  Home BP Monitoring - 120-130/80s  Chest pain- no  Dyspnea- no Headache - no  Medications: Lisinopril 20 mg, HCTZ 12.5 mg  Compliance- yes Lightheadedness- no  Edema- no     Past Medical History:  Diagnosis Date  . Acid reflux   . Arthritis   . Breast cancer ([place])   . Hypertension   . Obesity, unspecified   . Pure hyperglyceridemia    No Known Allergies  Current Outpatient Medications on File Prior to Visit  Medication Sig Dispense Refill  . alendronate (FOSAMAX) 70 MG tablet Take 70 mg by mouth once a week.    Marland Kitchen aspirin 81 MG tablet  Take 81 mg by mouth daily.    Marland Kitchen atorvastatin (LIPITOR) 20 MG tablet Take 1 tablet (20 mg total) by mouth daily. 90 tablet 3  . cholecalciferol (VITAMIN D3) 25 MCG (1000 UT) tablet Take 1 tablet (1,000 Units total) by mouth daily. 30 tablet 0  . Insulin Glargine (LANTUS SOLOSTAR) 100 UNIT/ML Solostar Pen Inject 15 Units into the skin daily. 15 mL 11  . Insulin Pen Needle 32G X 4 MM MISC Check blood sugars before meals and at bedtime. Dx: E11.9 200 each 3  . lisinopril-hydrochlorothiazide (ZESTORETIC) 20-12.5 MG tablet Take 1 tablet by mouth daily. 30 tablet 0   No current facility-administered medications on file prior to visit.    Observations/Objective: NAD. Speaking clearly.  Work of breathing normal.  Alert and oriented. Mood appropriate.   Assessment and Plan: 1. Type 2 diabetes mellitus with hyperglycemia, with long-term current use of insulin (HCC) Fasting CBGs above goal. Increase Lantus to 24u daily. Return for repeat A1c to help adjust medication regimen further.  Counseled on Diabetic diet, my plate method, 623 minutes of moderate intensity exercise/week Blood sugar logs with fasting goals of 80-120 mg/dl, random of less than 180 and in the event of sugars less than 60 mg/dl or greater than 400 mg/dl encouraged  to notify the clinic. Advised on the need for annual eye exams, annual foot exams, Pneumonia vaccine. - Hemoglobin A1c; Future - Microalbumin/Creatinine Ratio, Urine; Future - Ambulatory referral to Ophthalmology - insulin glargine (LANTUS SOLOSTAR) 100 UNIT/ML Solostar Pen; Inject 24 Units into the skin daily.  Dispense: 15 mL; Refill: 11  2. Essential hypertension BP seems to be fairly well controlled. Asymptomatic. Continue current regimen.   3. Mixed hyperlipidemia Continue statin therapy.   4. Need for hepatitis C screening test - Hepatitis C Antibody; Future    Follow Up Instructions: Call back for lab visit, 3 month f/u for chronic medical conditions     I discussed the assessment and treatment plan with the patient. The patient was provided an opportunity to ask questions and all were answered. The patient agreed with the plan and demonstrated an understanding of the instructions.   The patient was advised to call back or seek an in-person evaluation if the symptoms worsen or if the condition fails to improve as anticipated.     I provided 14 minutes total of non-face-to-face time during this encounter including median intraservice time, reviewing previous notes, investigations, ordering medications, medical decision making, coordinating care and patient verbalized understanding at the end of the visit.    Phill Myron, D.O. [facility] at [facility]  02/01/2020, 3:53 PM

## 2020-02-14 ENCOUNTER — Other Ambulatory Visit: Payer: Self-pay

## 2020-02-14 MED ORDER — ATORVASTATIN CALCIUM 20 MG PO TABS: 20.0000 mg | ORAL_TABLET | Freq: Every day | ORAL | 0 refills | Status: DC

## 2020-02-14 MED ORDER — LISINOPRIL-HYDROCHLOROTHIAZIDE 20-12.5 MG PO TABS: 1.0000 | ORAL_TABLET | Freq: Every day | ORAL | 0 refills | Status: DC

## 2020-03-08 ENCOUNTER — Other Ambulatory Visit: Payer: Self-pay | Admitting: Family Medicine

## 2020-03-08 NOTE — Telephone Encounter (Signed)
Rx request- PCE patient. Sent for review.

## 2020-05-06 ENCOUNTER — Telehealth: Payer: Self-pay

## 2020-05-06 DIAGNOSIS — E1165 Type 2 diabetes mellitus with hyperglycemia: Secondary | ICD-10-CM

## 2020-05-06 MED ORDER — LISINOPRIL-HYDROCHLOROTHIAZIDE 20-12.5 MG PO TABS: 1.0000 | ORAL_TABLET | Freq: Every day | ORAL | 0 refills | Status: DC

## 2020-05-06 MED ORDER — ATORVASTATIN CALCIUM 20 MG PO TABS: 20.0000 mg | ORAL_TABLET | Freq: Every day | ORAL | 0 refills | Status: DC

## 2020-05-06 MED ORDER — LANTUS SOLOSTAR 100 UNIT/ML ~~LOC~~ SOPN: 24.0000 [IU] | PEN_INJECTOR | Freq: Every day | SUBCUTANEOUS | 0 refills | Status: DC

## 2020-05-06 MED ORDER — INSULIN PEN NEEDLE 32G X 4 MM MISC: 0 refills | Status: DC

## 2020-05-06 NOTE — Telephone Encounter (Signed)
The meds that PCP fills please send to the [place] Fax: [phone] Phone: [phone]

## 2020-05-06 NOTE — Telephone Encounter (Signed)
I did send 90 day supply to requested pharmacy.  Patient had a televisit on 02/01/2020. Labs were ordered & patient never came to get them done. Please schedule a lab appointment for her ASAP. Schedule an in person appointment 3 months from the date you schedule the lab appointment.

## 2020-05-08 ENCOUNTER — Other Ambulatory Visit (INDEPENDENT_AMBULATORY_CARE_PROVIDER_SITE_OTHER): Payer: Medicare Other

## 2020-05-08 ENCOUNTER — Other Ambulatory Visit: Payer: Self-pay

## 2020-05-08 DIAGNOSIS — Z1159 Encounter for screening for other viral diseases: Secondary | ICD-10-CM

## 2020-05-08 DIAGNOSIS — E1165 Type 2 diabetes mellitus with hyperglycemia: Secondary | ICD-10-CM

## 2020-05-08 DIAGNOSIS — Z794 Long term (current) use of insulin: Secondary | ICD-10-CM | POA: Diagnosis not present

## 2020-05-09 LAB — MICROALBUMIN / CREATININE URINE RATIO
Creatinine, Urine: 130.7 mg/dL
Microalb/Creat Ratio: 69 mg/g creat — ABNORMAL HIGH (ref 0–29)
Microalbumin, Urine: 90 ug/mL

## 2020-05-09 LAB — HEPATITIS C ANTIBODY: Hep C Virus Ab: <0.1 s/co ratio (ref 0.0–0.9)

## 2020-05-09 LAB — HEMOGLOBIN A1C
Est. average glucose Bld gHb Est-mCnc: 217 mg/dL
Hgb A1c MFr Bld: 9.2 % — ABNORMAL HIGH (ref 4.8–5.6)

## 2020-05-10 NOTE — Progress Notes (Signed)
Patient notified of results & recommendations. Expressed understanding. Made an in person appointment with covering provider on 05/16/20 @ 1010 AM

## 2020-05-16 ENCOUNTER — Other Ambulatory Visit: Payer: Self-pay

## 2020-05-16 ENCOUNTER — Ambulatory Visit (INDEPENDENT_AMBULATORY_CARE_PROVIDER_SITE_OTHER): Payer: Medicare Other | Admitting: Physician Assistant

## 2020-05-16 VITALS — BP 194/96 | HR 84 | Temp 97.2°F | Resp 17 | Wt 243.0 lb

## 2020-05-16 DIAGNOSIS — I10 Essential (primary) hypertension: Secondary | ICD-10-CM | POA: Diagnosis not present

## 2020-05-16 DIAGNOSIS — M170 Bilateral primary osteoarthritis of knee: Secondary | ICD-10-CM | POA: Diagnosis not present

## 2020-05-16 DIAGNOSIS — E1165 Type 2 diabetes mellitus with hyperglycemia: Secondary | ICD-10-CM

## 2020-05-16 DIAGNOSIS — E782 Mixed hyperlipidemia: Secondary | ICD-10-CM

## 2020-05-16 DIAGNOSIS — Z794 Long term (current) use of insulin: Secondary | ICD-10-CM | POA: Diagnosis not present

## 2020-05-16 LAB — GLUCOSE, POCT (MANUAL RESULT ENTRY): POC Glucose: 185 mg/dL — AB (ref 70–99)

## 2020-05-16 MED ORDER — LETROZOLE 2.5 MG PO TABS: 2.5000 mg | ORAL_TABLET | Freq: Every day | ORAL | 1 refills | Status: DC

## 2020-05-16 MED ORDER — ALENDRONATE SODIUM 70 MG PO TABS: 70.0000 mg | ORAL_TABLET | ORAL | 2 refills | Status: DC

## 2020-05-16 MED ORDER — LISINOPRIL-HYDROCHLOROTHIAZIDE 20-12.5 MG PO TABS: 2.0000 | ORAL_TABLET | Freq: Every day | ORAL | 1 refills | Status: DC

## 2020-05-16 MED ORDER — INSULIN PEN NEEDLE 32G X 4 MM MISC: 0 refills | Status: DC

## 2020-05-16 MED ORDER — INSULIN ASPART 100 UNIT/ML FLEXPEN: 8.0000 [IU] | PEN_INJECTOR | Freq: Three times a day (TID) | SUBCUTANEOUS | 11 refills | Status: DC

## 2020-05-16 MED ORDER — INSULIN PEN NEEDLE 32G X 4 MM MISC: 4 refills | Status: DC

## 2020-05-16 MED ORDER — IBUPROFEN 600 MG PO TABS: 600.0000 mg | ORAL_TABLET | Freq: Three times a day (TID) | ORAL | 2 refills | Status: AC | PRN

## 2020-05-16 MED ORDER — LANTUS SOLOSTAR 100 UNIT/ML ~~LOC~~ SOPN: 28.0000 [IU] | PEN_INJECTOR | Freq: Every day | SUBCUTANEOUS | 3 refills | Status: DC

## 2020-05-16 MED ORDER — ATORVASTATIN CALCIUM 20 MG PO TABS: 20.0000 mg | ORAL_TABLET | Freq: Every day | ORAL | 0 refills | Status: DC

## 2020-05-16 NOTE — Progress Notes (Signed)
Patient ID: Emily Gray, female   DOB: Jan 09, 1946, 74 y.o.   MRN: [id number]     Emily Gray, is a 74 y.o. female  UVO:[id number]  [id number]:[id number]  DOB - 1946/01/21  Subjective:  Chief Complaint and HPI: Emily Gray is a 74 y.o. female here today bc her most recent a1c had gone from 7.8 to 9.2.  She prescribed novolog about 1.5 years ago during a hospitalization. She has not been getting her novolog prescribed here.  Rather, she has been taking her husband's novolog in addition to the lantus.  She is compliant with taking 8 units Novolog 3 times daily(with meals).  Blood sugars at home in the high 100s and low 200s.  No hypoglycemia.  Denies polyuria/polydipsia.  Does not adhere to diabetic diet  Compliant with BP meds.  No HA/cp/DIZZINESS.    ROS:   Constitutional:  No f/c, No night sweats, No unexplained weight loss. EENT:  No vision changes, No blurry vision, No hearing changes. No mouth, throat, or ear problems.  Respiratory: No cough, No SOB Cardiac: No CP, no palpitations GI:  No abd pain, No N/V/D. GU: No Urinary s/sx Musculoskeletal: No joint pain Neuro: No headache, no dizziness, no motor weakness.  Skin: No rash Endocrine:  No polydipsia. No polyuria.  Psych: Denies SI/HI  No problems updated.  ALLERGIES: No Known Allergies  PAST MEDICAL HISTORY: Past Medical History:  Diagnosis Date  . Acid reflux   . Arthritis   . Breast cancer ([place])   . Hypertension   . Obesity, unspecified   . Pure hyperglyceridemia     MEDICATIONS AT HOME: Prior to Admission medications   Medication Sig Start Date End Date Taking? Authorizing Provider  alendronate (FOSAMAX) 70 MG tablet Take 1 tablet (70 mg total) by mouth once a week. 05/16/20  Yes Argentina Donovan, PA-C  aspirin 81 MG tablet Take 81 mg by mouth daily.   Yes [provider]  atorvastatin (LIPITOR) 20 MG tablet Take 1 tablet (20 mg total) by mouth daily. 05/16/20  Yes Argentina Donovan, PA-C   cholecalciferol (VITAMIN D3) 25 MCG (1000 UT) tablet Take 1 tablet (1,000 Units total) by mouth daily. 10/20/18  Yes Debbe Odea, MD  insulin glargine (LANTUS SOLOSTAR) 100 UNIT/ML Solostar Pen Inject 28 Units into the skin daily. Dx: E11.9 05/16/20  Yes Denym Christenberry, Dionne Bucy, PA-C  Insulin Pen Needle 32G X 4 MM MISC Use with insulin pen daily. Dx: E11.9 05/16/20  Yes Argentina Donovan, PA-C  letrozole [facility]) 2.5 MG tablet Take 1 tablet (2.5 mg total) by mouth daily. 05/16/20  Yes Airel Magadan M, PA-C  lisinopril-hydrochlorothiazide (ZESTORETIC) 20-12.5 MG tablet Take 2 tablets by mouth daily. 05/16/20  Yes Kahil Agner M, PA-C  insulin aspart (NOVOLOG) 100 UNIT/ML FlexPen Inject 8 Units into the skin 3 (three) times daily with meals. 05/16/20   Argentina Donovan, PA-C     Objective:  EXAM:   Vitals:   05/16/20 0957  BP: (!) 194/96  Pulse: 84  Resp: 17  Temp: (!) 97.2 F (36.2 C)  TempSrc: Temporal  SpO2: 97%  Weight: 243 lb (110.2 kg)    General appearance : A&OX3. NAD. Non-toxic-appearing HEENT: Atraumatic and Normocephalic.  PERRLA. EOM intact.   Chest/Lungs:  Breathing-non-labored, Good air entry bilaterally, breath sounds normal without rales, rhonchi, or wheezing  CVS: S1 S2 regular, no murmurs, gallops, rubs . Extremities: Bilateral Lower Ext shows no edema, both legs are warm to touch with = pulse throughout Neurology:  CN II-XII grossly intact, Non focal.   Psych:  TP linear. J/I fair. Normal speech. Appropriate eye contact and affect.  Skin:  No Rash  Data Review Lab Results  Component Value Date   HGBA1C 9.2 (H) 05/08/2020   HGBA1C 7.8 (A) 08/09/2019   HGBA1C 7.1 (H) 05/03/2019     Assessment & Plan   1. Type 2 diabetes mellitus with hyperglycemia, with long-term current use of insulin (HCC) Uncontrolled.  Check blood sugars fasting and in the evenings and record and have ready for televisit in 3 weeks.  Patient education on diabetic diet and not using  other's medications at length -increase dose of lantus from 24 to 28 units daily.   -sent her a prescription of novolog pen bc she has been taking her husband's for the last year at 8 units tid with food.  I did not make changes to that dose. - Glucose (CBG) - HM Diabetes Foot Exam - insulin glargine (LANTUS SOLOSTAR) 100 UNIT/ML Solostar Pen; Inject 28 Units into the skin daily. Dx: E11.9  Dispense: 15 mL; Refill: 3  2. Essential hypertension Increase dose of Lisinopril/HCT 20/12.5 to 2 tabs daily.  Check BP 2-3 times weekly and record and have ready for televisit  3. Mixed hyperlipidemia Continue atorvastatin 20mg  daily.  4.  Arthritis.  Ibuprofen 600mg  tid prn with food-use sparingly prn   Patient have been counseled extensively about nutrition and exercise  Return for 3 weeks televisit with luke for DM and htn;  3 months with PCP.  The patient was given clear instructions to go to ER or return to medical center if symptoms don't improve, worsen or new problems develop. The patient verbalized understanding. The patient was told to call to get lab results if they haven't heard anything in the next week.     Freeman Caldron, PA-C [facility] [place], [place]   05/16/2020, 10:30 AM

## 2020-05-16 NOTE — Patient Instructions (Signed)
Check blood sugars fasting and bedtime and record and have ready for your telephone visit.    Check blood pressures 2-3 times weekly and have those readings ready as well

## 2020-06-06 ENCOUNTER — Other Ambulatory Visit: Payer: Self-pay

## 2020-06-06 ENCOUNTER — Encounter: Payer: Self-pay | Admitting: Pharmacist

## 2020-06-06 ENCOUNTER — Ambulatory Visit: Payer: Medicare Other | Attending: Internal Medicine | Admitting: Pharmacist

## 2020-06-06 VITALS — BP 144/78

## 2020-06-06 DIAGNOSIS — Z794 Long term (current) use of insulin: Secondary | ICD-10-CM

## 2020-06-06 DIAGNOSIS — E1165 Type 2 diabetes mellitus with hyperglycemia: Secondary | ICD-10-CM | POA: Diagnosis not present

## 2020-06-06 NOTE — Progress Notes (Signed)
S:    PCP: Dr. Juleen China   No chief complaint on file.  Patient arrives in good spirits.  Presents for diabetes evaluation, education, and management. Patient was referred on 05/16/2020 by Freeman Caldron. Her PCP is Dr. Juleen China; last visit with her occurred on 02/01/20 via telemedicine. At her visit with Levada Dy, Lantus was increased. In addition, pt's BP was 194/96 at that visit. Lisinopril-HCTZ dose was increased to 40-25 mg daily.   Brief HPI:  Patient reports Diabetes was diagnosed in 2020. She was hospitalized here at [facility] from 10/17/18 to 10/20/2018 for generalized weakness, polyuria, polydipsia. Found to be in DKA with A1c 13.1. She was commenced on Lantus and Novolog. Upon review of her notes, it appears that she was dx with T2DM ~3 years prior to that hospitalization.   Family/Social History:  - FHx: DM, HTN - Tobacco: former smoker   - Alcohol: denies use   Insurance coverage/medication affordability: Medicare, [employer]   Medication adherence reported.   Current diabetes medications include: Lantus 28 units daily, Novolog 8 units TID before meals *Of note, she is opposed to starting metformin d/t potential side effects.  Current hypertension medications include: lisinopril-HCTZ 20-12.5 mg; takes 2 tablets (40-25 mg TDD) daily Current hyperlipidemia medications include: atorvastatin 20 mg daily  Patient denies hypoglycemic events.  Patient reported dietary habits:  - Eats 3 meals daily  - Recently trying to improve her diet; she has "cut out bread and soda"  Patient-reported exercise habits:  -Walks w/ a walker in her garage 2x daily    Patient denies nocturia (nighttime urination).  Patient denies neuropathy (nerve pain). Patient denies visual changes. Patient reports self foot exams.     O:  Lab Results  Component Value Date   HGBA1C 9.2 (H) 05/08/2020   Vitals:   06/06/20 1128  BP: (!) 144/78    Lipid Panel     Component Value Date/Time   CHOL 133  08/09/2019 1601   TRIG 69 08/09/2019 1601   HDL 36 (L) 08/09/2019 1601   CHOLHDL 3.7 08/09/2019 1601   LDLCALC 83 08/09/2019 1601    Home fasting blood sugars: 134 - 185  2 hour post-meal/random blood sugars: does not check these regularly. *These were reported; she does not have her blood sugar meter with her    Clinical Atherosclerotic Cardiovascular Disease (ASCVD): No  The 10-year ASCVD risk score Mikey Bussing DC Jr., et al., 2013) is: 23%   Values used to calculate the score:     Age: 74 years     Sex: Female     Is Non-Hispanic African American: Yes     Diabetic: Yes     Tobacco smoker: No     Systolic Blood Pressure: 671 mmHg     Is BP treated: Yes     HDL Cholesterol: 36 mg/dL     Total Cholesterol: 133 mg/dL    A/P: Diabetes longstanding currently uncontrolled but home CBG levels seem to be improving. Patient is able to verbalize appropriate hypoglycemia management plan. Medication adherence appears optimal. Would prefer to titrate insulin slowly to minimize risk of hypoglycemia and falls. Will make no changes today, but reinforced home CBG goals. Will have her return in 2 weeks for reassessment.  -Continued current regimen.  -Extensively discussed pathophysiology of diabetes, recommended lifestyle interventions, dietary effects on blood sugar control -Counseled on s/sx of and management of hypoglycemia -Next A1C anticipated 07/2019.   ASCVD risk - primary prevention in patient with diabetes. Last LDL 83.  ASCVD risk score is >20%  - statin intensification to 40 mg daily is appropriate in this case. Will discuss at follow-up.  -Continued atorvastatin 20 mg for now.   Hypertension longstanding currently above goal but improved with dose increase of Zestoretic.  SBP goal = 130 mmHg. Medication adherence reported. Given improvement, will continue same regimen for now. -Continue lisinopril-HCTZ 40-25 mg daily.   Written patient instructions provided.  Total time in face to face  counseling 30 minutes.   Follow up Pharmacist Clinic Visit in 2 weeks.    Benard Halsted, PharmD, [place] [phone]

## 2020-06-20 ENCOUNTER — Other Ambulatory Visit: Payer: Self-pay

## 2020-06-20 ENCOUNTER — Ambulatory Visit: Payer: Medicare Other | Attending: Internal Medicine | Admitting: Pharmacist

## 2020-06-20 ENCOUNTER — Encounter: Payer: Self-pay | Admitting: Pharmacist

## 2020-06-20 DIAGNOSIS — Z794 Long term (current) use of insulin: Secondary | ICD-10-CM

## 2020-06-20 DIAGNOSIS — E1165 Type 2 diabetes mellitus with hyperglycemia: Secondary | ICD-10-CM | POA: Diagnosis not present

## 2020-06-20 LAB — GLUCOSE, POCT (MANUAL RESULT ENTRY): POC Glucose: 275 mg/dl — AB (ref 70–99)

## 2020-06-20 MED ORDER — LANTUS SOLOSTAR 100 UNIT/ML ~~LOC~~ SOPN: 32.0000 [IU] | PEN_INJECTOR | Freq: Every day | SUBCUTANEOUS | 1 refills | Status: DC

## 2020-06-20 NOTE — Patient Instructions (Signed)
Thank you for coming to see me today. Please do the following:  1. Increase Lantus to 32 units daily.  2. Continue Novolog 8 units before meals.  3. Continue taking lisinopril. Take 2 tablets every day. You can take these at the same time, one right after the other.   4. Continue checking blood sugars at home. 5. Continue making the lifestyle changes we've discussed together during our visit. Diet and exercise play a significant role in improving your blood sugars.  6. Follow-up with me in 2 weeks.    Hypoglycemia or low blood sugar:   Low blood sugar can happen quickly and may become an emergency if not treated right away.   While this shouldn't happen often, it can be brought upon if you skip a meal or do not eat enough. Also, if your insulin or other diabetes medications are dosed too high, this can cause your blood sugar to go to low.   Warning signs of low blood sugar include: 1. Feeling shaky or dizzy 2. Feeling weak or tired  3. Excessive hunger 4. Feeling anxious or upset  5. Sweating even when you aren't exercising  What to do if I experience low blood sugar? 1. Check your blood sugar with your meter. If lower than 70, proceed to step 2.  2. Treat with 3-4 glucose tablets or 3 packets of regular sugar. If these aren't around, you can try hard candy. Yet another option would be to drink 4 ounces of fruit juice or 6 ounces of REGULAR soda.  3. Re-check your sugar in 15 minutes. If it is still below 70, do what you did in step 2 again. If has come back up, go ahead and eat a snack or small meal at this time.

## 2020-06-20 NOTE — Progress Notes (Signed)
    S:    PCP: Dr. Juleen China   No chief complaint on file.  Patient arrives in good spirits.  Presents for diabetes evaluation, education, and management. Patient was referred on 05/16/2020 by Freeman Caldron.   Family/Social History:  - FHx: DM, HTN - Tobacco: former smoker   - Alcohol: denies use   Insurance coverage/medication affordability: Medicare, [employer]   Medication adherence reported.   Current diabetes medications include: Lantus 28 units daily, Novolog 8 units TID before meals *Of note, she is opposed to starting metformin d/t potential side effects.  Current hypertension medications include: lisinopril-HCTZ 20-12.5 mg; takes 2 tablets (40-25 mg TDD) daily Current hyperlipidemia medications include: atorvastatin 20 mg daily  Patient denies hypoglycemic events.  Patient reported dietary habits:  - Eats 3 meals daily  - Recently trying to improve her diet; she has "cut out bread and soda"  Patient-reported exercise habits:  -Walks w/ a walker in her garage 2x daily    Patient denies nocturia (nighttime urination).  Patient denies neuropathy (nerve pain). Patient denies visual changes. Patient reports self foot exams.     O:  POCT: 275  Lab Results  Component Value Date   HGBA1C 9.2 (H) 05/08/2020   Vitals:   06/20/20 1411  BP: 134/78   Lipid Panel     Component Value Date/Time   CHOL 133 08/09/2019 1601   TRIG 69 08/09/2019 1601   HDL 36 (L) 08/09/2019 1601   CHOLHDL 3.7 08/09/2019 1601   LDLCALC 83 08/09/2019 1601   Home fasting blood sugars: 130s-180s. Admits to some values >200.  2 hour post-meal/random blood sugars: does not check these regularly. *These were reported; she does not have her blood sugar meter with her   Clinical Atherosclerotic Cardiovascular Disease (ASCVD): No  The 10-year ASCVD risk score Mikey Bussing DC Jr., et al., 2013) is: 20.5%   Values used to calculate the score:     Age: 74 years     Sex: Female     Is Non-Hispanic  African American: Yes     Diabetic: Yes     Tobacco smoker: No     Systolic Blood Pressure: 707 mmHg     Is BP treated: Yes     HDL Cholesterol: 36 mg/dL     Total Cholesterol: 133 mg/dL    A/P: Diabetes longstanding currently uncontrolled but home CBG levels seem to be improving. Patient is able to verbalize appropriate hypoglycemia management plan. Medication adherence appears optimal.   -Increase Lantus to 32 units daily.  -Extensively discussed pathophysiology of diabetes, recommended lifestyle interventions, dietary effects on blood sugar control -Counseled on s/sx of and management of hypoglycemia -Next A1C anticipated 07/2019.   ASCVD risk - primary prevention in patient with diabetes. Last LDL 83. ASCVD risk score is >20%  - statin intensification to 40 mg daily may be appropriate in this case. Will hold off on changes for today.  -Continued atorvastatin 20 mg for now.   Hypertension longstanding currently close to goal. SBP goal = 130 mmHg. Medication adherence reported.  -Continue lisinopril-HCTZ 40-25 mg daily.   Written patient instructions provided.  Total time in face to face counseling 30 minutes.   Follow up w/ PCP.  Benard Halsted, PharmD, [place] [phone]

## 2020-07-24 DIAGNOSIS — E669 Obesity, unspecified: Secondary | ICD-10-CM | POA: Diagnosis not present

## 2020-07-24 DIAGNOSIS — M170 Bilateral primary osteoarthritis of knee: Secondary | ICD-10-CM | POA: Diagnosis not present

## 2020-07-24 DIAGNOSIS — Z7982 Long term (current) use of aspirin: Secondary | ICD-10-CM | POA: Diagnosis not present

## 2020-07-24 DIAGNOSIS — Z79899 Other long term (current) drug therapy: Secondary | ICD-10-CM | POA: Diagnosis not present

## 2020-07-24 DIAGNOSIS — Z170 Estrogen receptor positive status [ER+]: Secondary | ICD-10-CM | POA: Diagnosis not present

## 2020-07-24 DIAGNOSIS — C50319 Malignant neoplasm of lower-inner quadrant of unspecified female breast: Secondary | ICD-10-CM | POA: Diagnosis not present

## 2020-07-24 DIAGNOSIS — C50311 Malignant neoplasm of lower-inner quadrant of right female breast: Secondary | ICD-10-CM | POA: Diagnosis not present

## 2020-07-24 DIAGNOSIS — R262 Difficulty in walking, not elsewhere classified: Secondary | ICD-10-CM | POA: Diagnosis not present

## 2020-07-24 DIAGNOSIS — Z87891 Personal history of nicotine dependence: Secondary | ICD-10-CM | POA: Diagnosis not present

## 2020-07-24 DIAGNOSIS — Z9181 History of falling: Secondary | ICD-10-CM | POA: Diagnosis not present

## 2020-07-24 DIAGNOSIS — I10 Essential (primary) hypertension: Secondary | ICD-10-CM | POA: Diagnosis not present

## 2020-07-31 ENCOUNTER — Other Ambulatory Visit: Payer: Self-pay | Admitting: Internal Medicine

## 2020-08-08 ENCOUNTER — Ambulatory Visit (INDEPENDENT_AMBULATORY_CARE_PROVIDER_SITE_OTHER): Payer: Medicare Other | Admitting: Internal Medicine

## 2020-08-08 ENCOUNTER — Other Ambulatory Visit: Payer: Self-pay

## 2020-08-08 ENCOUNTER — Encounter: Payer: Self-pay | Admitting: Internal Medicine

## 2020-08-08 VITALS — BP 132/78 | HR 88 | Temp 98.2°F | Resp 18 | Ht 64.0 in | Wt 237.4 lb

## 2020-08-08 DIAGNOSIS — Z794 Long term (current) use of insulin: Secondary | ICD-10-CM | POA: Diagnosis not present

## 2020-08-08 DIAGNOSIS — I10 Essential (primary) hypertension: Secondary | ICD-10-CM

## 2020-08-08 DIAGNOSIS — E1165 Type 2 diabetes mellitus with hyperglycemia: Secondary | ICD-10-CM

## 2020-08-08 DIAGNOSIS — E782 Mixed hyperlipidemia: Secondary | ICD-10-CM

## 2020-08-08 LAB — POCT GLYCOSYLATED HEMOGLOBIN (HGB A1C): Hemoglobin A1C: 14.4 % — AB (ref 4.0–5.6)

## 2020-08-08 LAB — GLUCOSE, POCT (MANUAL RESULT ENTRY): POC Glucose: 444 mg/dl — AB (ref 70–99)

## 2020-08-08 MED ORDER — LANTUS SOLOSTAR 100 UNIT/ML ~~LOC~~ SOPN: 36.0000 [IU] | PEN_INJECTOR | Freq: Every day | SUBCUTANEOUS | 1 refills | Status: DC

## 2020-08-08 MED ORDER — METFORMIN HCL 500 MG PO TABS: ORAL_TABLET | ORAL | 3 refills | Status: DC

## 2020-08-08 NOTE — Progress Notes (Signed)
Subjective:    Emily Gray - 75 y.o. female MRN [id number]  Date of birth: 1946/06/30  HPI  Emily Gray is here for follow up of chronic medical conditons.   Diabetes mellitus, Type 2 Disease Monitoring             Blood Sugar Ranges: Fasting - 300s-400s, 2-3 hours after breakfast mid to low 200s, prior to bedtime typically upper 200s               Polyuria: yes             Visual problems: no   Urine Microalbumin 69 (Oct 2021)---on Ace   Last A1C: 9.2 (Oct 2021)   Medications: Lantus 32u daily, Patient reports she has started Novolog SSI BID based on another prior provider's recommendations. She does not have this scale with her.  Medication Compliance: yes  Medication Side Effects             Hypoglycemia: no   Chronic HTN Disease Monitoring:  Home BP Monitoring - Does not monitor  Chest pain- no  Dyspnea- no Headache - no  Medications: Lisinopril 40 mg, HCTZ 25 mg  Compliance- yes Lightheadedness- no  Edema- no       Health Maintenance:  Health Maintenance Due  Topic Date Due  . OPHTHALMOLOGY EXAM  Never done    -  reports that she has quit smoking. She has never used smokeless tobacco. - Review of Systems: Per HPI. - Past Medical History: Patient Active Problem List   Diagnosis Date Noted  . 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  . DM (diabetes mellitus), type 2 ([place]) 10/20/2018  . Obesity 10/20/2018  . OSA (obstructive sleep apnea) 10/18/2018  . Hypertension   . Breast cancer ([place])   . Varicose veins of lower extremities with other complications 14/48/1856   - Medications: reviewed and updated   Objective:   Physical Exam BP 132/78 (BP Location: Right Arm, Patient Position: Sitting, Cuff Size: Large)   Pulse 88   Temp 98.2 F (36.8 C) (Oral)   Resp 18   Ht 5\' 4"  (1.626 m)   Wt 237 lb 6.4 oz (107.7 kg)   SpO2 96%   BMI 40.75 kg/m  Physical Exam Constitutional:      General: She is not in acute  distress.    Appearance: She is not diaphoretic.  HENT:     Head: Normocephalic and atraumatic.  Eyes:     Extraocular Movements: EOM normal.     Conjunctiva/sclera: Conjunctivae normal.  Cardiovascular:     Rate and Rhythm: Normal rate and regular rhythm.     Heart sounds: Normal heart sounds. No murmur heard.   Pulmonary:     Effort: Pulmonary effort is normal. No respiratory distress.     Breath sounds: Normal breath sounds.  Musculoskeletal:        General: Normal range of motion.  Skin:    General: Skin is warm and dry.  Neurological:     Mental Status: She is alert and oriented to person, place, and time.  Psychiatric:        Mood and Affect: Affect normal.        Judgment: Judgment normal.            Assessment & Plan:   1. Type 2 diabetes mellitus with hyperglycemia, with long-term current use of insulin (HCC) A1c 14.4. Increase Lantus to 36u BID. Added Metformin as patient is now willing to try this medication. Have  concerns about lack of understanding over diabetic diet. Reviewed in depth. Recommended to keep food log prior to visit with Benard Halsted, PharmD. - Ambulatory referral to Ophthalmology - HgB A1c - Glucose (CBG) - metFORMIN (GLUCOPHAGE) 500 MG tablet; Take one tablet daily for one week. Increase to BID if tolerating after one week.  Dispense: 180 tablet; Refill: 3 - insulin glargine (LANTUS SOLOSTAR) 100 UNIT/ML Solostar Pen; Inject 36 Units into the skin daily. Dx: E11.9  Dispense: 15 mL; Refill: 1 - Basic metabolic panel  2. Primary hypertension BP is at goal today. Continue current regimen.  - Basic metabolic panel  3. Mixed hyperlipidemia On Lipitor 20 mg. Monitor LDL.  - LDL Cholesterol, Direct    Phill Myron, D.O. 08/08/2020, 1:46 PM [facility]

## 2020-08-08 NOTE — Patient Instructions (Signed)
Increase your Lantus to 36 units starting tomorrow morning.   New Rx Metformin starts tomorrow. Take one tablet in the morning with your insulin. If you are not having bad stomach side effects (nausea, diarrhea) in one week add a second tablet of Metformin in the evening. Then you would be taking it twice a day.

## 2020-08-08 NOTE — Progress Notes (Signed)
vbReports increase to 32 units seems to not be enough, has had home readings in high 100s, has been in 400s at times

## 2020-08-09 LAB — BASIC METABOLIC PANEL
BUN/Creatinine Ratio: 24 (ref 12–28)
BUN: 24 mg/dL (ref 8–27)
CO2: 25 mmol/L (ref 20–29)
Calcium: 9.3 mg/dL (ref 8.7–10.3)
Chloride: 96 mmol/L (ref 96–106)
Creatinine, Ser: 0.99 mg/dL (ref 0.57–1.00)
GFR calc Af Amer: 65 mL/min/{1.73_m2} (ref >59)
GFR calc non Af Amer: 57 mL/min/{1.73_m2} — ABNORMAL LOW (ref >59)
Glucose: 491 mg/dL — ABNORMAL HIGH (ref 65–99)
Potassium: 4.7 mmol/L (ref 3.5–5.2)
Sodium: 134 mmol/L (ref 134–144)

## 2020-08-09 LAB — LDL CHOLESTEROL, DIRECT: LDL Direct: 85 mg/dL (ref 0–99)

## 2020-08-21 DIAGNOSIS — Z13820 Encounter for screening for osteoporosis: Secondary | ICD-10-CM | POA: Diagnosis not present

## 2020-08-21 DIAGNOSIS — C50319 Malignant neoplasm of lower-inner quadrant of unspecified female breast: Secondary | ICD-10-CM | POA: Diagnosis not present

## 2020-08-23 ENCOUNTER — Other Ambulatory Visit: Payer: Self-pay

## 2020-08-23 ENCOUNTER — Encounter: Payer: Self-pay | Admitting: Pharmacist

## 2020-08-23 ENCOUNTER — Ambulatory Visit: Payer: Medicare Other | Attending: Internal Medicine | Admitting: Pharmacist

## 2020-08-23 DIAGNOSIS — E1165 Type 2 diabetes mellitus with hyperglycemia: Secondary | ICD-10-CM

## 2020-08-23 DIAGNOSIS — Z794 Long term (current) use of insulin: Secondary | ICD-10-CM | POA: Diagnosis not present

## 2020-08-23 MED ORDER — ONETOUCH VERIO VI STRP: ORAL_STRIP | 2 refills | Status: DC

## 2020-08-23 MED ORDER — ONETOUCH VERIO REFLECT W/DEVICE KIT: PACK | 0 refills | Status: DC

## 2020-08-23 MED ORDER — INSULIN ASPART 100 UNIT/ML FLEXPEN: 12.0000 [IU] | PEN_INJECTOR | Freq: Three times a day (TID) | SUBCUTANEOUS | 2 refills | Status: DC

## 2020-08-23 MED ORDER — ONETOUCH DELICA PLUS LANCET33G MISC: 2 refills | Status: DC

## 2020-08-23 MED ORDER — METFORMIN HCL 500 MG PO TABS: 1000.0000 mg | ORAL_TABLET | Freq: Two times a day (BID) | ORAL | 2 refills | Status: DC

## 2020-08-23 MED ORDER — LANTUS SOLOSTAR 100 UNIT/ML ~~LOC~~ SOPN: 42.0000 [IU] | PEN_INJECTOR | Freq: Every day | SUBCUTANEOUS | 2 refills | Status: DC

## 2020-08-23 NOTE — Patient Instructions (Signed)
Thank you for coming to see me today. Please do the following:  1. Increase Lantus to 42 units daily in the morning.  2. Increase Novolog to 12 units 15 minutes before meals.  3. Increase Metformin to 2 pills in the morning and 2 pills in the evening.  4. Continue checking blood sugars at home.  5. Continue making the lifestyle changes we've discussed together during our visit. Diet and exercise play a significant role in improving your blood sugars.  6. Follow-up with me 2 weeks.    Hypoglycemia or low blood sugar:   Low blood sugar can happen quickly and may become an emergency if not treated right away.   While this shouldn't happen often, it can be brought upon if you skip a meal or do not eat enough. Also, if your insulin or other diabetes medications are dosed too high, this can cause your blood sugar to go to low.   Warning signs of low blood sugar include: 1. Feeling shaky or dizzy 2. Feeling weak or tired  3. Excessive hunger 4. Feeling anxious or upset  5. Sweating even when you aren't exercising  What to do if I experience low blood sugar? 1. Check your blood sugar with your meter. If lower than 70, proceed to step 2.  2. Treat with 3-4 glucose tablets or 3 packets of regular sugar. If these aren't around, you can try hard candy. Yet another option would be to drink 4 ounces of fruit juice or 6 ounces of REGULAR soda.  3. Re-check your sugar in 15 minutes. If it is still below 70, do what you did in step 2 again. If has come back up, go ahead and eat a snack or small meal at this time.

## 2020-08-23 NOTE — Progress Notes (Signed)
    S:    PCP: Dr. Juleen China   No chief complaint on file.  Patient arrives in good spirits.  Presents for diabetes evaluation, education, and management. Patient was referred on 08/08/2020 by Dr. Juleen China.   I've seen her before. I last saw her on 06/20/2020. At that time, her home CBGs ranged in the 100s with rare CBGs > 200. Since then, she does not know what has caused her home CBGs to become elevated. She endorses adherence to her insulin and verbalizes her prescribed doses without me prompting her. She denies any problems with injection technique. She admits to dietary indiscretion.   Family/Social History:  - FHx: DM, HTN - Tobacco: former smoker   - Alcohol: denies use   Insurance coverage/medication affordability: Medicare, [employer]   Medication adherence reported.   Current diabetes medications include: Lantus 36 units daily, metformin 1000 mg daily, Novolog 8 units TID before meals Current hypertension medications include: lisinopril-HCTZ 20-12.5 mg; takes 2 tablets (40-25 mg TDD) daily Current hyperlipidemia medications include: atorvastatin 20 mg daily  Patient denies hypoglycemic events.  Patient reported dietary habits:  - Eats 3 meals daily  - Recently trying to improve her diet; she has "cut out bread and soda"  Patient-reported exercise habits:  -Walks w/ a walker in her garage 2x daily    Patient denies nocturia (nighttime urination).  Patient denies neuropathy (nerve pain). Patient denies visual changes. Patient reports self foot exams.     O:  Lab Results  Component Value Date   HGBA1C 14.4 (A) 08/08/2020   There were no vitals filed for this visit. Lipid Panel     Component Value Date/Time   CHOL 133 08/09/2019 1601   TRIG 69 08/09/2019 1601   HDL 36 (L) 08/09/2019 1601   CHOLHDL 3.7 08/09/2019 1601   LDLCALC 83 08/09/2019 1601   LDLDIRECT 85 08/08/2020 1419   Home fasting blood sugars: 130s-180s. Admits to some values >200.  2 hour  post-meal/random blood sugars: does not check these regularly. *These were reported; she does not have her blood sugar meter with her   Clinical Atherosclerotic Cardiovascular Disease (ASCVD): No  The 10-year ASCVD risk score Mikey Bussing DC Jr., et al., 2013) is: 20.5%   Values used to calculate the score:     Age: 75 years     Sex: Female     Is Non-Hispanic African American: Yes     Diabetic: Yes     Tobacco smoker: No     Systolic Blood Pressure: 063 mmHg     Is BP treated: Yes     HDL Cholesterol: 36 mg/dL     Total Cholesterol: 133 mg/dL    A/P: Diabetes longstanding currently uncontrolled. Patient is able to verbalize appropriate hypoglycemia management plan. Medication adherence appears appropriate.   -Increase Lantus to 42 units daily.  -Increase Novolog to 12 units TID before meals.  -Increase metformin to 1000 mg BID. -Extensively discussed pathophysiology of diabetes, recommended lifestyle interventions, dietary effects on blood sugar control -Counseled on s/sx of and management of hypoglycemia -Next A1C anticipated 10/2020.   Written patient instructions provided.  Total time in face to face counseling 30 minutes.   Follow up w/ me in 2 weeks.  Benard Halsted, PharmD, [place], [place] [phone]

## 2020-09-06 ENCOUNTER — Ambulatory Visit: Payer: Medicare Other | Attending: Internal Medicine | Admitting: Pharmacist

## 2020-09-06 ENCOUNTER — Other Ambulatory Visit: Payer: Self-pay

## 2020-09-06 DIAGNOSIS — E1165 Type 2 diabetes mellitus with hyperglycemia: Secondary | ICD-10-CM

## 2020-09-06 DIAGNOSIS — Z794 Long term (current) use of insulin: Secondary | ICD-10-CM | POA: Diagnosis not present

## 2020-09-06 LAB — GLUCOSE, POCT (MANUAL RESULT ENTRY): POC Glucose: 222 mg/dl — AB (ref 70–99)

## 2020-09-06 MED ORDER — INSULIN ASPART 100 UNIT/ML FLEXPEN: 12.0000 [IU] | PEN_INJECTOR | Freq: Two times a day (BID) | SUBCUTANEOUS | 2 refills | Status: DC

## 2020-09-06 NOTE — Progress Notes (Signed)
    S:    PCP: Dr. Juleen China   No chief complaint on file.  Patient arrives in good spirits.  Presents for diabetes evaluation, education, and management. Patient was referred on 08/08/2020 by Dr. Juleen China. I saw her on 08/23/2020 and adjusted her insulin.   Family/Social History:  - FHx: DM, HTN - Tobacco: former smoker   - Alcohol: denies use   Insurance coverage/medication affordability: Medicare, [employer]   Medication adherence reported but she is taking Novolog only once daily before dinner.  Current diabetes medications include: Lantus 36 units daily, metformin 1000 mg daily, Novolog 12 units TID before meals (once daily) Current hypertension medications include: lisinopril-HCTZ 20-12.5 mg; takes 2 tablets (40-25 mg TDD) daily Current hyperlipidemia medications include: atorvastatin 20 mg daily  Patient denies hypoglycemic events.  Patient reported dietary habits:  - Eats 2 meals daily; skips lunch - Recently trying to improve her diet; she has "cut out bread and soda"  Patient-reported exercise habits:  -Walks w/ a walker in her garage 2x daily    Patient denies nocturia (nighttime urination).  Patient denies neuropathy (nerve pain). Patient denies visual changes. Patient reports self foot exams.     O:  POCT: 222  Lab Results  Component Value Date   HGBA1C 14.4 (A) 08/08/2020   There were no vitals filed for this visit. Lipid Panel     Component Value Date/Time   CHOL 133 08/09/2019 1601   TRIG 69 08/09/2019 1601   HDL 36 (L) 08/09/2019 1601   CHOLHDL 3.7 08/09/2019 1601   LDLCALC 83 08/09/2019 1601   LDLDIRECT 85 08/08/2020 1419   Home fasting blood sugars: 117 - 222 (1 outlier of 260) 2 hour post-meal/random blood sugars: does not check these regularly.  Clinical Atherosclerotic Cardiovascular Disease (ASCVD): No  The 10-year ASCVD risk score Mikey Bussing DC Jr., et al., 2013) is: 20.5%   Values used to calculate the score:     Age: 75 years     Sex: Female      Is Non-Hispanic African American: Yes     Diabetic: Yes     Tobacco smoker: No     Systolic Blood Pressure: 749 mmHg     Is BP treated: Yes     HDL Cholesterol: 36 mg/dL     Total Cholesterol: 133 mg/dL    A/P: Diabetes longstanding currently uncontrolled. Patient is able to verbalize appropriate hypoglycemia management plan. Medication adherence appears appropriate.   -Continue Lantus 42 units daily.  -Increase Novolog to 12 units to BID before meals.  -Continue metformin 1000 mg BID. -Extensively discussed pathophysiology of diabetes, recommended lifestyle interventions, dietary effects on blood sugar control -Counseled on s/sx of and management of hypoglycemia -Next A1C anticipated 10/2020.   Written patient instructions provided.  Total time in face to face counseling 30 minutes.   Follow up w/ me in 2 weeks.  Benard Halsted, PharmD, [facility] [place], [place] [phone]

## 2020-09-06 NOTE — Patient Instructions (Signed)
Thank you for coming to see me today. Please do the following:  1. Continue Lantus 42 units in the morning.  2. Take Novolog 12 units before breakfast and 12 units before dinner.  3. Continue metformin twice a day.  4. Continue checking blood sugars at home. It's really important that you record these and bring these in to your next doctor's appointment. If you get in readings above 500 or lower than 70, call me or the clinic to let your doctor know. See below on how to treat low blood sugar.  5. Continue making the lifestyle changes we've discussed together during our visit. Diet and exercise play a significant role in improving your blood sugars.  6. Follow-up with Dr. Juleen China.    Hypoglycemia or low blood sugar:   Low blood sugar can happen quickly and may become an emergency if not treated right away.   While this shouldn't happen often, it can be brought upon if you skip a meal or do not eat enough. Also, if your insulin or other diabetes medications are dosed too high, this can cause your blood sugar to go to low.   Warning signs of low blood sugar include: 1. Feeling shaky or dizzy 2. Feeling weak or tired  3. Excessive hunger 4. Feeling anxious or upset  5. Sweating even when you aren't exercising  What to do if I experience low blood sugar? 1. Check your blood sugar with your meter. If lower than 70, proceed to step 2.  2. Treat with 3-4 glucose tablets or 3 packets of regular sugar. If these aren't around, you can try hard candy. Yet another option would be to drink 4 ounces of fruit juice or 6 ounces of REGULAR soda.  3. Re-check your sugar in 15 minutes. If it is still below 70, do what you did in step 2 again. If has come back up, go ahead and eat a snack or small meal at this time.

## 2020-10-02 ENCOUNTER — Ambulatory Visit (INDEPENDENT_AMBULATORY_CARE_PROVIDER_SITE_OTHER): Payer: Medicare Other

## 2020-10-02 ENCOUNTER — Other Ambulatory Visit: Payer: Self-pay

## 2020-10-02 ENCOUNTER — Ambulatory Visit (INDEPENDENT_AMBULATORY_CARE_PROVIDER_SITE_OTHER): Payer: Medicare Other | Admitting: Podiatry

## 2020-10-02 ENCOUNTER — Encounter: Payer: Self-pay | Admitting: Podiatry

## 2020-10-02 DIAGNOSIS — M79671 Pain in right foot: Secondary | ICD-10-CM

## 2020-10-02 DIAGNOSIS — M21619 Bunion of unspecified foot: Secondary | ICD-10-CM

## 2020-10-02 DIAGNOSIS — E1140 Type 2 diabetes mellitus with diabetic neuropathy, unspecified: Secondary | ICD-10-CM | POA: Diagnosis not present

## 2020-10-02 DIAGNOSIS — M2042 Other hammer toe(s) (acquired), left foot: Secondary | ICD-10-CM

## 2020-10-02 DIAGNOSIS — M2041 Other hammer toe(s) (acquired), right foot: Secondary | ICD-10-CM | POA: Diagnosis not present

## 2020-10-02 DIAGNOSIS — E1149 Type 2 diabetes mellitus with other diabetic neurological complication: Secondary | ICD-10-CM | POA: Diagnosis not present

## 2020-10-02 DIAGNOSIS — M79672 Pain in left foot: Secondary | ICD-10-CM

## 2020-10-02 MED ORDER — GABAPENTIN 300 MG PO CAPS
300.0000 mg | ORAL_CAPSULE | Freq: Three times a day (TID) | ORAL | 3 refills | Status: DC
Start: 2020-10-02 — End: 2020-11-15

## 2020-10-02 NOTE — Progress Notes (Signed)
Subjective:   Patient ID: Emily Gray, female   DOB: 75 y.o.   MRN: [id number]   HPI Patient presents stating she has a lot of tingling and burning of both feet and states it is across the entire feet and knows that she does have bad bunions and hammertoes which do get tender and also has flatfoot deformity bilateral.  Patient sugars not in good control with A1c 14 and does not smoke and but needs to be active   Review of Systems  All other systems reviewed and are negative.       Objective:  Physical Exam Vitals and nursing note reviewed.  Constitutional:      Appearance: She is well-developed.  Pulmonary:     Effort: Pulmonary effort is normal.  Musculoskeletal:        General: Normal range of motion.  Skin:    General: Skin is warm.  Neurological:     Mental Status: She is alert.     Vascular status is mildly diminished sharp dull vibratory but intact with patient found to have diminishment of sharp dull and does have diminishment of pulses PT DP of a mild nature.  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  At this point I went ahead and working to start her on gabapentin to try to help with the burning pain she is getting and ice strongly encouraged her to try to get her sugar under better control.  I then reviewed daily diabetic inspections  X-rays indicate significant structural bunion deformity bilateral with hammertoe deformity mild osteoporosis flatfoot deformity

## 2020-11-05 ENCOUNTER — Other Ambulatory Visit: Payer: Self-pay

## 2020-11-05 ENCOUNTER — Telehealth: Payer: Self-pay | Admitting: Internal Medicine

## 2020-11-05 DIAGNOSIS — E782 Mixed hyperlipidemia: Secondary | ICD-10-CM

## 2020-11-05 DIAGNOSIS — C50919 Malignant neoplasm of unspecified site of unspecified female breast: Secondary | ICD-10-CM

## 2020-11-05 MED ORDER — ATORVASTATIN CALCIUM 20 MG PO TABS: 1.0000 | ORAL_TABLET | Freq: Every day | ORAL | 1 refills | Status: DC

## 2020-11-05 MED ORDER — LETROZOLE 2.5 MG PO TABS: 2.5000 mg | ORAL_TABLET | Freq: Every day | ORAL | 1 refills | Status: DC

## 2020-11-05 NOTE — Progress Notes (Signed)
Atorvastatin and letrozole refilled

## 2020-11-05 NOTE — Telephone Encounter (Signed)
Pt would like a refill on these medications   atorvastatin (LIPITOR) 20 MG tablet [[id number]]  letrozole (FEMARA) 2.5 MG tablet [[id number]].   Pt is aware that she has an appt next Friday but she was concerned that she would run out of her pills before the appt. Please follow up and advise on next steps.

## 2020-11-06 ENCOUNTER — Ambulatory Visit: Payer: Medicare Other | Admitting: Internal Medicine

## 2020-11-15 ENCOUNTER — Other Ambulatory Visit: Payer: Self-pay

## 2020-11-15 ENCOUNTER — Ambulatory Visit (INDEPENDENT_AMBULATORY_CARE_PROVIDER_SITE_OTHER): Payer: Medicare Other | Admitting: Internal Medicine

## 2020-11-15 ENCOUNTER — Encounter: Payer: Self-pay | Admitting: Internal Medicine

## 2020-11-15 VITALS — BP 138/85 | HR 82 | Temp 98.1°F | Ht 64.02 in | Wt 226.8 lb

## 2020-11-15 DIAGNOSIS — Z794 Long term (current) use of insulin: Secondary | ICD-10-CM | POA: Diagnosis not present

## 2020-11-15 DIAGNOSIS — I10 Essential (primary) hypertension: Secondary | ICD-10-CM

## 2020-11-15 DIAGNOSIS — E782 Mixed hyperlipidemia: Secondary | ICD-10-CM | POA: Diagnosis not present

## 2020-11-15 DIAGNOSIS — C50919 Malignant neoplasm of unspecified site of unspecified female breast: Secondary | ICD-10-CM | POA: Diagnosis not present

## 2020-11-15 DIAGNOSIS — E1165 Type 2 diabetes mellitus with hyperglycemia: Secondary | ICD-10-CM

## 2020-11-15 DIAGNOSIS — R350 Frequency of micturition: Secondary | ICD-10-CM | POA: Diagnosis not present

## 2020-11-15 LAB — POCT URINALYSIS DIP (CLINITEK)
Bilirubin, UA: NEGATIVE
Glucose, UA: 250 mg/dL — AB
Nitrite, UA: NEGATIVE
POC PROTEIN,UA: 30 — AB
Spec Grav, UA: >=1.03 — AB (ref 1.010–1.025)
Urobilinogen, UA: 0.2 E.U./dL
pH, UA: 5.5 (ref 5.0–8.0)

## 2020-11-15 MED ORDER — INSULIN PEN NEEDLE 32G X 4 MM MISC: 4 refills | Status: AC

## 2020-11-15 MED ORDER — INSULIN ASPART 100 UNIT/ML FLEXPEN: 14.0000 [IU] | PEN_INJECTOR | Freq: Two times a day (BID) | SUBCUTANEOUS | 2 refills | Status: DC

## 2020-11-15 MED ORDER — ONETOUCH DELICA PLUS LANCET33G MISC: 2 refills | Status: DC

## 2020-11-15 MED ORDER — GABAPENTIN 300 MG PO CAPS: 300.0000 mg | ORAL_CAPSULE | Freq: Three times a day (TID) | ORAL | 3 refills | Status: DC

## 2020-11-15 MED ORDER — ATORVASTATIN CALCIUM 20 MG PO TABS: 1.0000 | ORAL_TABLET | Freq: Every day | ORAL | 1 refills | Status: DC

## 2020-11-15 MED ORDER — LETROZOLE 2.5 MG PO TABS: 2.5000 mg | ORAL_TABLET | Freq: Every day | ORAL | 1 refills | Status: DC

## 2020-11-15 MED ORDER — LISINOPRIL-HYDROCHLOROTHIAZIDE 20-12.5 MG PO TABS: 2.0000 | ORAL_TABLET | Freq: Every day | ORAL | 1 refills | Status: DC

## 2020-11-15 MED ORDER — LANTUS SOLOSTAR 100 UNIT/ML ~~LOC~~ SOPN: 44.0000 [IU] | PEN_INJECTOR | Freq: Every day | SUBCUTANEOUS | 2 refills | Status: DC

## 2020-11-15 NOTE — Progress Notes (Signed)
Subjective:    Emily Gray - 75 y.o. female MRN [id number]  Date of birth: 31-Oct-1945  HPI  Emily Gray is here for follow up of DM. She reports frequent urination throughout the day and overnight. Does have some discomfort and burning when she pees. No fevers. No flank pain.   Diabetes mellitus, Type 2 Disease Monitoring             Blood Sugar Ranges: Fasting - >250             Polyuria: yes              Visual problems: no   Urine Microalbumin 69 on Lisinopril   Last A1C: 14.4   Medications: Metformin 1000 mg BID, Lantus 44u daily, Novolog 14u at night (not taking with her other meal of the day)  Medication Compliance: no--stopped Metformin due to diarrhea  Medication Side Effects             Hypoglycemia: no    Health Maintenance:  Health Maintenance Due  Topic Date Due  . OPHTHALMOLOGY EXAM  Never done  . COVID-19 Vaccine (1) Never done    -  reports that she has quit smoking. She has never used smokeless tobacco. - Review of Systems: Per HPI. - Past Medical History: Patient Active Problem List   Diagnosis Date Noted  . 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  . DM (diabetes mellitus), type 2 ([place]) 10/20/2018  . Obesity 10/20/2018  . OSA (obstructive sleep apnea) 10/18/2018  . Hypertension   . Breast cancer ([place])   . Varicose veins of lower extremities with other complications 17/40/8144   - Medications: reviewed and updated   Objective:   Physical Exam BP 138/85 (BP Location: Right Arm, Patient Position: Sitting)   Pulse 82   Temp 98.1 F (36.7 C)   Ht 5' 4.02" (1.626 m)   Wt 226 lb 12.8 oz (102.9 kg)   SpO2 95%   BMI 38.91 kg/m  Physical Exam Constitutional:      General: She is not in acute distress.    Appearance: She is not diaphoretic.  HENT:     Head: Normocephalic and atraumatic.  Eyes:     Conjunctiva/sclera: Conjunctivae normal.  Cardiovascular:     Rate and Rhythm: Normal rate and regular rhythm.     Heart sounds:  Normal heart sounds. No murmur heard.   Pulmonary:     Effort: Pulmonary effort is normal. No respiratory distress.     Breath sounds: Normal breath sounds.  Musculoskeletal:        General: Normal range of motion.  Skin:    General: Skin is warm and dry.  Neurological:     Mental Status: She is alert and oriented to person, place, and time.  Psychiatric:        Mood and Affect: Affect normal.        Judgment: Judgment normal.            Assessment & Plan:   1. Type 2 diabetes mellitus with hyperglycemia, with long-term current use of insulin (HCC) A1c remains very poorly controlled with result of 13.1. Discussed with patient risks of prolonged hyperglycemia. Have suggested that she begin to use Novolog 14u with her other meal of the day, not just at dinner time. She has self discontinued Metformin due to diarrhea. Needs follow up with Benard Halsted, PharmD to discuss additional medication regimen and review diabetic education. Given polyuria with mild ketones on urine,  will check BMET. She has no signs/symptoms of DKA such as confusion, nausea/vomtiing, loss of appetite with weight loss, or change in respiratory status.  - Basic Metabolic Panel - Insulin Pen Needle 32G X 4 MM MISC; Use with insulin pen daily. Dx: E11.9  Dispense: 100 each; Refill: 4 - Lancets (ONETOUCH DELICA PLUS [id number]) MISC; Use to check blood sugar TID. E11.65  Dispense: 100 each; Refill: 2 - gabapentin (NEURONTIN) 300 MG capsule; Take 1 capsule (300 mg total) by mouth 3 (three) times daily.  Dispense: 90 capsule; Refill: 3 - insulin aspart (NOVOLOG) 100 UNIT/ML FlexPen; Inject 14 Units into the skin 2 (two) times daily.  Dispense: 15 mL; Refill: 2 - insulin glargine (LANTUS SOLOSTAR) 100 UNIT/ML Solostar Pen; Inject 44 Units into the skin daily. Dx: E11.9  Dispense: 15 mL; Refill: 2 - POCT glycosylated hemoglobin (Hb A1C)  2. Frequent urination Suspect related to polyuria from uncontrolled DM and  glucose present in the urine. However, given her other symptoms and presence of leukocytes will also check a urine culture and vaginal swab for infections. Given RBCs on dipstick, will send for micro analysis.  - POCT URINALYSIS DIP (CLINITEK) - Urinalysis, Routine w reflex microscopic - Urine Culture - Cervicovaginal ancillary only  3. Mixed hyperlipidemia - atorvastatin (LIPITOR) 20 MG tablet; Take 1 tablet (20 mg total) by mouth daily.  Dispense: 90 tablet; Refill: 1  4. Primary hypertension BP well controlled. Asymptomatic. Continue current regimen.  - lisinopril-hydrochlorothiazide (ZESTORETIC) 20-12.5 MG tablet; Take 2 tablets by mouth daily.  Dispense: 180 tablet; Refill: 1  5. Malignant neoplasm of female breast, unspecified estrogen receptor status, unspecified laterality, unspecified site of breast ([place]) - letrozole (FEMARA) 2.5 MG tablet; Take 1 tablet (2.5 mg total) by mouth daily.  Dispense: 90 tablet; Refill: [place], D.O. 11/15/2020, 11:22 AM Primary Care at [facility]

## 2020-11-15 NOTE — Progress Notes (Signed)
Diabetes f/u Pt experiencing frequent urination

## 2020-11-16 LAB — URINALYSIS, ROUTINE W REFLEX MICROSCOPIC
Bilirubin, UA: NEGATIVE
Nitrite, UA: NEGATIVE
Specific Gravity, UA: 1.02 (ref 1.005–1.030)
Urobilinogen, Ur: 0.2 mg/dL (ref 0.2–1.0)
pH, UA: 5 (ref 5.0–7.5)

## 2020-11-16 LAB — BASIC METABOLIC PANEL
BUN/Creatinine Ratio: 29 — ABNORMAL HIGH (ref 12–28)
BUN: 24 mg/dL (ref 8–27)
CO2: 21 mmol/L (ref 20–29)
Calcium: 9.8 mg/dL (ref 8.7–10.3)
Chloride: 96 mmol/L (ref 96–106)
Creatinine, Ser: 0.83 mg/dL (ref 0.57–1.00)
Glucose: 272 mg/dL — ABNORMAL HIGH (ref 65–99)
Potassium: 4.2 mmol/L (ref 3.5–5.2)
Sodium: 135 mmol/L (ref 134–144)
eGFR: 74 mL/min/{1.73_m2} (ref >59)

## 2020-11-16 LAB — MICROSCOPIC EXAMINATION
Casts: NONE SEEN /lpf
RBC, Urine: NONE SEEN /hpf (ref 0–2)
WBC, UA: >30 /hpf — AB (ref 0–5)

## 2020-11-18 ENCOUNTER — Telehealth: Payer: Self-pay | Admitting: Internal Medicine

## 2020-11-18 NOTE — Telephone Encounter (Signed)
Pt called to find out lab results from Friday November 15, 2020.

## 2020-11-19 ENCOUNTER — Telehealth: Payer: Self-pay | Admitting: *Deleted

## 2020-11-19 NOTE — Telephone Encounter (Signed)
Noted  

## 2020-11-19 NOTE — Telephone Encounter (Addendum)
Patient informed that a recollect was needed in order to obtain results from her specimen. Specimen had leaked out. She states she lives too far from this office so she was given option to go to [facility] for collection.   Patient verbalized understanding. Will collect specimen at the [facility] location. Advised to come in on Thursday, May 4.

## 2020-11-20 ENCOUNTER — Other Ambulatory Visit: Payer: Medicare Other

## 2020-11-21 LAB — POCT GLYCOSYLATED HEMOGLOBIN (HGB A1C): Hemoglobin A1C: 13.1 % — AB (ref 4.0–5.6)

## 2020-11-22 ENCOUNTER — Other Ambulatory Visit: Payer: Self-pay | Admitting: Internal Medicine

## 2020-11-22 LAB — URINE CULTURE

## 2020-11-22 MED ORDER — AMOXICILLIN-POT CLAVULANATE 875-125 MG PO TABS: 1.0000 | ORAL_TABLET | Freq: Two times a day (BID) | ORAL | 0 refills | Status: DC

## 2020-12-05 DIAGNOSIS — Z6841 Body Mass Index (BMI) 40.0 and over, adult: Secondary | ICD-10-CM | POA: Diagnosis not present

## 2020-12-05 DIAGNOSIS — I10 Essential (primary) hypertension: Secondary | ICD-10-CM | POA: Diagnosis not present

## 2020-12-05 DIAGNOSIS — G4733 Obstructive sleep apnea (adult) (pediatric): Secondary | ICD-10-CM | POA: Diagnosis not present

## 2021-02-26 DIAGNOSIS — R262 Difficulty in walking, not elsewhere classified: Secondary | ICD-10-CM | POA: Diagnosis not present

## 2021-02-26 DIAGNOSIS — C50319 Malignant neoplasm of lower-inner quadrant of unspecified female breast: Secondary | ICD-10-CM | POA: Diagnosis not present

## 2021-02-26 DIAGNOSIS — Z170 Estrogen receptor positive status [ER+]: Secondary | ICD-10-CM | POA: Diagnosis not present

## 2021-02-26 DIAGNOSIS — C50311 Malignant neoplasm of lower-inner quadrant of right female breast: Secondary | ICD-10-CM | POA: Diagnosis not present

## 2021-02-26 DIAGNOSIS — Z6841 Body Mass Index (BMI) 40.0 and over, adult: Secondary | ICD-10-CM | POA: Diagnosis not present

## 2021-02-26 DIAGNOSIS — Z7982 Long term (current) use of aspirin: Secondary | ICD-10-CM | POA: Diagnosis not present

## 2021-02-26 DIAGNOSIS — I10 Essential (primary) hypertension: Secondary | ICD-10-CM | POA: Diagnosis not present

## 2021-02-26 DIAGNOSIS — M170 Bilateral primary osteoarthritis of knee: Secondary | ICD-10-CM | POA: Diagnosis not present

## 2021-02-26 DIAGNOSIS — Z87891 Personal history of nicotine dependence: Secondary | ICD-10-CM | POA: Diagnosis not present

## 2021-02-26 DIAGNOSIS — Z79899 Other long term (current) drug therapy: Secondary | ICD-10-CM | POA: Diagnosis not present

## 2021-02-26 DIAGNOSIS — E669 Obesity, unspecified: Secondary | ICD-10-CM | POA: Diagnosis not present

## 2021-02-26 DIAGNOSIS — F458 Other somatoform disorders: Secondary | ICD-10-CM | POA: Diagnosis not present

## 2021-02-26 DIAGNOSIS — G4733 Obstructive sleep apnea (adult) (pediatric): Secondary | ICD-10-CM | POA: Diagnosis not present

## 2021-02-26 DIAGNOSIS — Z9181 History of falling: Secondary | ICD-10-CM | POA: Diagnosis not present

## 2021-02-28 ENCOUNTER — Encounter: Payer: Self-pay | Admitting: Family Medicine

## 2021-02-28 ENCOUNTER — Ambulatory Visit (INDEPENDENT_AMBULATORY_CARE_PROVIDER_SITE_OTHER): Payer: Medicare Other | Admitting: Family Medicine

## 2021-02-28 ENCOUNTER — Other Ambulatory Visit: Payer: Self-pay

## 2021-02-28 VITALS — Temp 98.3°F | Resp 18 | Ht 64.02 in | Wt 238.0 lb

## 2021-02-28 DIAGNOSIS — Z6841 Body Mass Index (BMI) 40.0 and over, adult: Secondary | ICD-10-CM

## 2021-02-28 DIAGNOSIS — Z794 Long term (current) use of insulin: Secondary | ICD-10-CM

## 2021-02-28 DIAGNOSIS — E782 Mixed hyperlipidemia: Secondary | ICD-10-CM

## 2021-02-28 DIAGNOSIS — I10 Essential (primary) hypertension: Secondary | ICD-10-CM | POA: Diagnosis not present

## 2021-02-28 DIAGNOSIS — E1165 Type 2 diabetes mellitus with hyperglycemia: Secondary | ICD-10-CM

## 2021-02-28 LAB — GLUCOSE, POCT (MANUAL RESULT ENTRY): POC Glucose: 139 mg/dl — AB (ref 70–99)

## 2021-02-28 LAB — POCT GLYCOSYLATED HEMOGLOBIN (HGB A1C): Hemoglobin A1C: 8.4 % — AB (ref 4.0–5.6)

## 2021-02-28 MED ORDER — LANTUS SOLOSTAR 100 UNIT/ML ~~LOC~~ SOPN: 44.0000 [IU] | PEN_INJECTOR | Freq: Every day | SUBCUTANEOUS | 2 refills | Status: DC

## 2021-02-28 MED ORDER — ATORVASTATIN CALCIUM 20 MG PO TABS: 20.0000 mg | ORAL_TABLET | Freq: Every day | ORAL | 1 refills | Status: DC

## 2021-02-28 MED ORDER — INSULIN ASPART 100 UNIT/ML FLEXPEN: 14.0000 [IU] | PEN_INJECTOR | Freq: Two times a day (BID) | SUBCUTANEOUS | 2 refills | Status: DC

## 2021-02-28 MED ORDER — LISINOPRIL-HYDROCHLOROTHIAZIDE 20-12.5 MG PO TABS: 2.0000 | ORAL_TABLET | Freq: Every day | ORAL | 1 refills | Status: DC

## 2021-02-28 NOTE — Progress Notes (Signed)
Established Patient Office Visit  Subjective:  Patient ID: Emily Gray, female    DOB: [birth year]  Age: 75 y.o. MRN: [id number]  CC:  Chief Complaint  Patient presents with   Diabetes    HPI Emily Gray presents for follow up of chronic med issues including diabetes and hypertension. She denies acute complaints or concerns.   Past Medical History:  Diagnosis Date   Acid reflux    Arthritis    Breast cancer ([place])    Hypertension    Obesity, unspecified    Pure hyperglyceridemia        Social History   Socioeconomic History   Marital status: Married    Spouse name: Not on file   Number of children: Not on file   Years of education: Not on file   Highest education level: Not on file  Occupational History   Not on file  Tobacco Use   Smoking status: Former   Smokeless tobacco: Never  Vaping Use   Vaping Use: Never used  Substance and Sexual Activity   Alcohol use: No   Drug use: No   Sexual activity: Not on file  Other Topics Concern   Not on file  Social History Narrative   Not on file   Social Determinants of Health   Financial Resource Strain: Not on file  Food Insecurity: Not on file  Transportation Needs: Not on file  Physical Activity: Not on file  Stress: Not on file  Social Connections: Not on file  Intimate Partner Violence: Not on file    Outpatient Medications Prior to Visit  Medication Sig Dispense Refill   alendronate (FOSAMAX) 70 MG tablet Take 1 tablet (70 mg total) by mouth once a week. 12 tablet 2   aspirin 81 MG tablet Take 81 mg by mouth daily.     atorvastatin (LIPITOR) 20 MG tablet Take 1 tablet (20 mg total) by mouth daily. 90 tablet 1   Blood Glucose Monitoring Suppl (ONETOUCH VERIO REFLECT) w/Device KIT Use to check blood sugar TID. E11.65 1 kit 0   cholecalciferol (VITAMIN D3) 25 MCG (1000 UT) tablet Take 1 tablet (1,000 Units total) by mouth daily. 30 tablet 0   glucose blood (ONETOUCH VERIO) test strip Use to check blood  sugar TID. E11.65 100 each 2   ibuprofen (ADVIL) 600 MG tablet Take 1 tablet (600 mg total) by mouth every 8 (eight) hours as needed. Use sparingly 40 tablet 2   insulin aspart (NOVOLOG) 100 UNIT/ML FlexPen Inject 14 Units into the skin 2 (two) times daily. 15 mL 2   insulin glargine (LANTUS SOLOSTAR) 100 UNIT/ML Solostar Pen Inject 44 Units into the skin daily. Dx: E11.9 15 mL 2   Insulin Pen Needle 32G X 4 MM MISC Use with insulin pen daily. Dx: E11.9 100 each 4   Lancets (ONETOUCH DELICA PLUS [id number]) MISC Use to check blood sugar TID. E11.65 100 each 2   letrozole (FEMARA) 2.5 MG tablet Take 1 tablet (2.5 mg total) by mouth daily. 90 tablet 1   lisinopril-hydrochlorothiazide (ZESTORETIC) 20-12.5 MG tablet Take 2 tablets by mouth daily. 180 tablet 1   amoxicillin-clavulanate (AUGMENTIN) 875-125 MG tablet Take 1 tablet by mouth 2 (two) times daily. 14 tablet 0   gabapentin (NEURONTIN) 300 MG capsule Take 1 capsule (300 mg total) by mouth 3 (three) times daily. 90 capsule 3   No facility-administered medications prior to visit.    No Known Allergies  ROS Review of Systems  All other  systems reviewed and are negative.    Objective:    Physical Exam Vitals and nursing note reviewed.  Constitutional:      General: She is not in acute distress.    Appearance: She is obese.  Cardiovascular:     Rate and Rhythm: Normal rate and regular rhythm.  Pulmonary:     Effort: Pulmonary effort is normal.     Breath sounds: Normal breath sounds.  Musculoskeletal:     Right lower leg: No edema.     Left lower leg: No edema.     Comments: Utilizing cane  Neurological:     General: No focal deficit present.     Mental Status: She is alert and oriented to person, place, and time. Mental status is at baseline.    Temp 98.3 F (36.8 C)   Resp 18   Ht 5' 4.02" (1.626 m)   Wt 238 lb (108 kg)   BMI 40.83 kg/m  Wt Readings from Last 3 Encounters:  02/28/21 238 lb (108 kg)  11/15/20 226 lb  12.8 oz (102.9 kg)  08/08/20 237 lb 6.4 oz (107.7 kg)     Health Maintenance Due  Topic Date Due   COVID-19 Vaccine (1) Never done   OPHTHALMOLOGY EXAM  Never done   Zoster Vaccines- Shingrix (1 of 2) Never done   MAMMOGRAM  11/23/2020   INFLUENZA VACCINE  02/17/2021    There are no preventive care reminders to display for this patient.     Lab Results  Component Value Date   HGBA1C 8.4 (A) 02/28/2021      Assessment & Plan:   Problem List Items Addressed This Visit       Endocrine   DM (diabetes mellitus), type 2 ([place]) - Primary   Relevant Orders   POCT glycosylated hemoglobin (Hb A1C) (Completed)   POCT glucose (manual entry) (Completed)    No orders of the defined types were placed in this encounter.   Follow-up: Return in about 3 months (around 05/31/2021) for chronic med issues.    Emily Sax, MD

## 2021-02-28 NOTE — Progress Notes (Signed)
Pt presents for diabetes follow-up, pt reports  hot flashes that has been going on for approx 3 months, mostly night sweating

## 2021-03-04 DIAGNOSIS — C50411 Malignant neoplasm of upper-outer quadrant of right female breast: Secondary | ICD-10-CM | POA: Diagnosis not present

## 2021-03-25 ENCOUNTER — Ambulatory Visit: Payer: Medicare Other | Admitting: Family Medicine

## 2021-06-02 ENCOUNTER — Encounter: Payer: Self-pay | Admitting: Family Medicine

## 2021-06-02 ENCOUNTER — Other Ambulatory Visit: Payer: Self-pay

## 2021-06-02 ENCOUNTER — Ambulatory Visit (INDEPENDENT_AMBULATORY_CARE_PROVIDER_SITE_OTHER): Payer: Medicare Other | Admitting: Family Medicine

## 2021-06-02 VITALS — BP 132/81 | HR 80 | Temp 98.1°F | Resp 16 | Wt 233.0 lb

## 2021-06-02 DIAGNOSIS — E782 Mixed hyperlipidemia: Secondary | ICD-10-CM | POA: Diagnosis not present

## 2021-06-02 DIAGNOSIS — E1165 Type 2 diabetes mellitus with hyperglycemia: Secondary | ICD-10-CM

## 2021-06-02 DIAGNOSIS — Z794 Long term (current) use of insulin: Secondary | ICD-10-CM | POA: Diagnosis not present

## 2021-06-02 DIAGNOSIS — Z6839 Body mass index (BMI) 39.0-39.9, adult: Secondary | ICD-10-CM

## 2021-06-02 DIAGNOSIS — I10 Essential (primary) hypertension: Secondary | ICD-10-CM | POA: Diagnosis not present

## 2021-06-02 LAB — POCT GLYCOSYLATED HEMOGLOBIN (HGB A1C): Hemoglobin A1C: 7.4 % — AB (ref 4.0–5.6)

## 2021-06-02 NOTE — Progress Notes (Signed)
Patient is here for 3 month follow-up  Patient said that she is having trouble sleeping.

## 2021-06-03 ENCOUNTER — Encounter: Payer: Self-pay | Admitting: Family Medicine

## 2021-06-03 MED ORDER — LISINOPRIL-HYDROCHLOROTHIAZIDE 20-12.5 MG PO TABS: 2.0000 | ORAL_TABLET | Freq: Every day | ORAL | 1 refills | Status: DC

## 2021-06-03 MED ORDER — ATORVASTATIN CALCIUM 20 MG PO TABS: 20.0000 mg | ORAL_TABLET | Freq: Every day | ORAL | 1 refills | Status: DC

## 2021-06-03 MED ORDER — LANTUS SOLOSTAR 100 UNIT/ML ~~LOC~~ SOPN: 44.0000 [IU] | PEN_INJECTOR | Freq: Every day | SUBCUTANEOUS | 2 refills | Status: DC

## 2021-06-03 NOTE — Progress Notes (Signed)
Established  Patient Office Visit  Subjective:  Patient ID: Emily Gray, female    DOB: 11-13-1945  Age: 75 y.o. MRN: [id number]  CC:  Chief Complaint  Patient presents with   Follow-up   Diabetes    HPI Emily Gray presents for routine follow up of chronic med issues including diabetes and hypertension.   Past Medical History:  Diagnosis Date   Acid reflux    Arthritis    Breast cancer (HCC)    Hypertension    Obesity, unspecified    Pure hyperglyceridemia     Past Surgical History:  Procedure Laterality Date   APPENDECTOMY     BILATERAL SALPINGOOPHORECTOMY     CATARACT EXTRACTION Left    EYE SURGERY Left 07/2008   macro-hole, filled with gel substance at [facility]   HYSTEROSCOPY WITH D & C  12/03/2004   TONSILLECTOMY     TUBAL LIGATION Bilateral     Family History  Problem Relation Age of Onset   Hypertension Mother    Cancer Father    Hypertension Father     Social History   Socioeconomic History   Marital status: Married    Spouse name: Not on file   Number of children: Not on file   Years of education: Not on file   Highest education level: Not on file  Occupational History   Not on file  Tobacco Use   Smoking status: Former   Smokeless tobacco: Never  Vaping Use   Vaping Use: Never used  Substance and Sexual Activity   Alcohol use: No   Drug use: No   Sexual activity: Not on file  Other Topics Concern   Not on file  Social History Narrative   Not on file   Social Determinants of Health   Financial Resource Strain: Not on file  Food Insecurity: Not on file  Transportation Needs: Not on file  Physical Activity: Not on file  Stress: Not on file  Social Connections: Not on file  Intimate Partner Violence: Not on file    ROS Review of Systems  All other systems reviewed and are negative.  Objective:   Today's Vitals: BP 132/81   Pulse 80   Temp 98.1 F (36.7 C) (Oral)   Resp 16   Wt 233 lb (105.7 kg)   SpO2 97%   BMI 39.97  kg/m   Physical Exam Vitals and nursing note reviewed.  Constitutional:      General: She is not in acute distress.    Appearance: She is obese.  Cardiovascular:     Rate and Rhythm: Normal rate and regular rhythm.  Pulmonary:     Effort: Pulmonary effort is normal.     Breath sounds: Normal breath sounds.  Abdominal:     Palpations: Abdomen is soft.     Tenderness: There is no abdominal tenderness.  Musculoskeletal:     Right lower leg: No edema.     Left lower leg: No edema.  Neurological:     General: No focal deficit present.     Mental Status: She is alert and oriented to person, place, and time.    Assessment & Plan:   1. Type 2 diabetes mellitus with hyperglycemia, with long-term current use of insulin (HCC) Improved A1c and just above goal. Continue and monitor. Meds refilled.  - POCT glycosylated hemoglobin (Hb A1C)  2. Essential hypertension Appears stable. Continue present mangement. Meds refilled.   3. Mixed hyperlipidemia Continue present management. Meds refilled  4.  Class 2 severe obesity due to excess calories with serious comorbidity and body mass index (BMI) of 39.0 to 39.9 in adult [facility]) Discussed dietary and activity options. Goal is 2-4 lbs/mo wt loss.     Outpatient Encounter Medications as of 06/02/2021  Medication Sig   aspirin 81 MG tablet Take 81 mg by mouth daily.   atorvastatin (LIPITOR) 20 MG tablet Take 1 tablet (20 mg total) by mouth daily.   Blood Glucose Monitoring Suppl (ONETOUCH VERIO REFLECT) w/Device KIT Use to check blood sugar TID. E11.65   cholecalciferol (VITAMIN D3) 25 MCG (1000 UT) tablet Take 1 tablet (1,000 Units total) by mouth daily.   glucose blood (ONETOUCH VERIO) test strip Use to check blood sugar TID. E11.65   ibuprofen (ADVIL) 600 MG tablet Take 1 tablet (600 mg total) by mouth every 8 (eight) hours as needed. Use sparingly   insulin glargine (LANTUS SOLOSTAR) 100 UNIT/ML Solostar Pen Inject 44 Units into the skin  daily. Dx: E11.9   Insulin Pen Needle 32G X 4 MM MISC Use with insulin pen daily. Dx: E11.9   Lancets (ONETOUCH DELICA PLUS [id number]) MISC Use to check blood sugar TID. E11.65   letrozole (FEMARA) 2.5 MG tablet Take 1 tablet (2.5 mg total) by mouth daily.   lisinopril-hydrochlorothiazide (ZESTORETIC) 20-12.5 MG tablet Take 2 tablets by mouth daily.   insulin aspart (NOVOLOG) 100 UNIT/ML FlexPen Inject 14 Units into the skin 2 (two) times daily. (Patient not taking: Reported on 06/02/2021)   No facility-administered encounter medications on file as of 06/02/2021.    Follow-up: Return in about 3 months (around 09/02/2021) for follow up.   Becky Sax, MD

## 2021-07-23 DIAGNOSIS — R600 Localized edema: Secondary | ICD-10-CM | POA: Diagnosis not present

## 2021-08-27 DIAGNOSIS — I10 Essential (primary) hypertension: Secondary | ICD-10-CM | POA: Diagnosis not present

## 2021-08-27 DIAGNOSIS — M170 Bilateral primary osteoarthritis of knee: Secondary | ICD-10-CM | POA: Diagnosis not present

## 2021-08-27 DIAGNOSIS — Z87891 Personal history of nicotine dependence: Secondary | ICD-10-CM | POA: Diagnosis not present

## 2021-08-27 DIAGNOSIS — E669 Obesity, unspecified: Secondary | ICD-10-CM | POA: Diagnosis not present

## 2021-08-27 DIAGNOSIS — Z170 Estrogen receptor positive status [ER+]: Secondary | ICD-10-CM | POA: Diagnosis not present

## 2021-08-27 DIAGNOSIS — Z9181 History of falling: Secondary | ICD-10-CM | POA: Diagnosis not present

## 2021-08-27 DIAGNOSIS — R42 Dizziness and giddiness: Secondary | ICD-10-CM | POA: Diagnosis not present

## 2021-08-27 DIAGNOSIS — C50311 Malignant neoplasm of lower-inner quadrant of right female breast: Secondary | ICD-10-CM | POA: Diagnosis not present

## 2021-08-27 DIAGNOSIS — R531 Weakness: Secondary | ICD-10-CM | POA: Diagnosis not present

## 2021-09-02 ENCOUNTER — Ambulatory Visit: Payer: Medicare Other | Admitting: Family Medicine

## 2021-09-03 ENCOUNTER — Other Ambulatory Visit: Payer: Self-pay

## 2021-09-03 ENCOUNTER — Ambulatory Visit (INDEPENDENT_AMBULATORY_CARE_PROVIDER_SITE_OTHER): Payer: Medicare Other | Admitting: Family Medicine

## 2021-09-03 ENCOUNTER — Encounter: Payer: Self-pay | Admitting: Family Medicine

## 2021-09-03 VITALS — BP 138/80 | HR 68 | Temp 98.1°F | Resp 16 | Wt 237.6 lb

## 2021-09-03 DIAGNOSIS — I10 Essential (primary) hypertension: Secondary | ICD-10-CM

## 2021-09-03 DIAGNOSIS — E782 Mixed hyperlipidemia: Secondary | ICD-10-CM

## 2021-09-03 DIAGNOSIS — E1165 Type 2 diabetes mellitus with hyperglycemia: Secondary | ICD-10-CM

## 2021-09-03 DIAGNOSIS — R2243 Localized swelling, mass and lump, lower limb, bilateral: Secondary | ICD-10-CM

## 2021-09-03 DIAGNOSIS — Z794 Long term (current) use of insulin: Secondary | ICD-10-CM

## 2021-09-03 MED ORDER — FREESTYLE LIBRE 2 SENSOR MISC: 1 refills | Status: DC

## 2021-09-03 NOTE — Progress Notes (Signed)
Patient c/o swelling in her ankles and hip pain at times  Patient need medication refills

## 2021-09-04 ENCOUNTER — Telehealth: Payer: Self-pay | Admitting: Family Medicine

## 2021-09-04 DIAGNOSIS — Z794 Long term (current) use of insulin: Secondary | ICD-10-CM

## 2021-09-04 DIAGNOSIS — E1165 Type 2 diabetes mellitus with hyperglycemia: Secondary | ICD-10-CM

## 2021-09-04 NOTE — Progress Notes (Signed)
New Patient Office Visit  Subjective:  Patient ID: Emily Gray, female    DOB: [birth year]  Age: 76 y.o. MRN: [id number]  CC:  Chief Complaint  Patient presents with   Follow-up   Diabetes    HPI Shandy Checo presents for follow up of chronic med issues. She complains of swelling of her legs that worsens throughout the day and improves during night at rest.   Past Medical History:  Diagnosis Date   Acid reflux    Arthritis    Breast cancer (HCC)    Hypertension    Obesity, unspecified    Pure hyperglyceridemia     Past Surgical History:  Procedure Laterality Date   APPENDECTOMY     BILATERAL SALPINGOOPHORECTOMY     CATARACT EXTRACTION Left    EYE SURGERY Left 07/2008   macro-hole, filled with gel substance at [facility]  12/03/2004   TONSILLECTOMY     TUBAL LIGATION Bilateral     Family History  Problem Relation Age of Onset   Hypertension Mother    Cancer Father    Hypertension Father     Social History   Socioeconomic History   Marital status: Married    Spouse name: Not on file   Number of children: Not on file   Years of education: Not on file   Highest education level: Not on file  Occupational History   Not on file  Tobacco Use   Smoking status: Former   Smokeless tobacco: Never  Vaping Use   Vaping Use: Never used  Substance and Sexual Activity   Alcohol use: No   Drug use: No   Sexual activity: Not on file  Other Topics Concern   Not on file  Social History Narrative   Not on file   Social Determinants of Health   Financial Resource Strain: Not on file  Food Insecurity: Not on file  Transportation Needs: Not on file  Physical Activity: Not on file  Stress: Not on file  Social Connections: Not on file  Intimate Partner Violence: Not on file    ROS Review of Systems  Cardiovascular:  Positive for leg swelling. Negative for chest pain and palpitations.  All other systems reviewed and are  negative.  Objective:   Today's Vitals: BP 138/80    Pulse 68    Temp 98.1 F (36.7 C) (Oral)    Resp 16    Wt 237 lb 9.6 oz (107.8 kg)    SpO2 98%    BMI 40.76 kg/m   Physical Exam Vitals and nursing note reviewed.  Constitutional:      General: She is not in acute distress.    Appearance: She is obese.  Cardiovascular:     Rate and Rhythm: Normal rate and regular rhythm.  Pulmonary:     Effort: Pulmonary effort is normal.     Breath sounds: Normal breath sounds.  Abdominal:     Palpations: Abdomen is soft.     Tenderness: There is no abdominal tenderness.  Musculoskeletal:     Right lower leg: Edema present.     Left lower leg: Edema present.     Comments: Patient utilizing cane for stability  Neurological:     General: No focal deficit present.     Mental Status: She is alert and oriented to person, place, and time.    Assessment & Plan:   1. Type 2 diabetes mellitus with hyperglycemia, with long-term current use of  insulin ([place]) Slightly increased A1c and not at goal. Encouraged compliance. Continue present management and monitor. Freestyle Libre prescribed.  2. Localized swelling of both lower legs Most likely due to vein incompetence. Patient defers further eval at this time and will get some support hose as well as keeping legs elevated when she can. monitor  3. Essential hypertension Appears stable with present management. Continue   4. Mixed hyperlipidemia Continue present management    Outpatient Encounter Medications as of 09/03/2021  Medication Sig   aspirin 81 MG tablet Take 81 mg by mouth daily.   atorvastatin (LIPITOR) 20 MG tablet Take 1 tablet (20 mg total) by mouth daily.   Blood Glucose Monitoring Suppl (ONETOUCH VERIO REFLECT) w/Device KIT Use to check blood sugar TID. E11.65   cholecalciferol (VITAMIN D3) 25 MCG (1000 UT) tablet Take 1 tablet (1,000 Units total) by mouth daily.   Continuous Blood Gluc Sensor (FREESTYLE LIBRE 2 SENSOR) MISC  Utilize as directed q2 weeks to monitor blood glucose - E11.9   glucose blood (ONETOUCH VERIO) test strip Use to check blood sugar TID. E11.65   ibuprofen (ADVIL) 600 MG tablet Take 1 tablet (600 mg total) by mouth every 8 (eight) hours as needed. Use sparingly   insulin aspart (NOVOLOG) 100 UNIT/ML FlexPen Inject 14 Units into the skin 2 (two) times daily.   insulin glargine (LANTUS SOLOSTAR) 100 UNIT/ML Solostar Pen Inject 44 Units into the skin daily. Dx: E11.9   Insulin Pen Needle 32G X 4 MM MISC Use with insulin pen daily. Dx: E11.9   Lancets (ONETOUCH DELICA PLUS [id number]) MISC Use to check blood sugar TID. E11.65   lisinopril-hydrochlorothiazide (ZESTORETIC) 20-12.5 MG tablet Take 2 tablets by mouth daily.   [DISCONTINUED] letrozole (FEMARA) 2.5 MG tablet Take 1 tablet (2.5 mg total) by mouth daily. (Patient not taking: Reported on 09/03/2021)   No facility-administered encounter medications on file as of 09/03/2021.    Follow-up: No follow-ups on file.   Becky Sax, MD

## 2021-09-05 ENCOUNTER — Other Ambulatory Visit: Payer: Self-pay

## 2021-09-05 ENCOUNTER — Encounter: Payer: Self-pay | Admitting: Family Medicine

## 2021-09-05 DIAGNOSIS — Z794 Long term (current) use of insulin: Secondary | ICD-10-CM

## 2021-09-05 DIAGNOSIS — E1165 Type 2 diabetes mellitus with hyperglycemia: Secondary | ICD-10-CM

## 2021-09-05 MED ORDER — ONETOUCH VERIO VI STRP: ORAL_STRIP | 2 refills | Status: DC

## 2021-09-05 NOTE — Telephone Encounter (Signed)
Duplicate request- rx filled today Requested Prescriptions  Pending Prescriptions Disp Refills   glucose blood (ONETOUCH VERIO) test strip [Pharmacy Med Name: OneTouch Verio In Vitro Strip] 100 each 0    Sig: USE 1 STRIP TO [place]     Endocrinology: Diabetes - Testing Supplies Failed - 09/04/2021 10:54 AM      Failed - Valid encounter within last 12 months    Recent Outpatient Visits          12 months ago Type 2 diabetes mellitus with hyperglycemia, with long-term current use of insulin [facility])   [place], Annie Main L, RPH-CPP   1 year ago Type 2 diabetes mellitus with hyperglycemia, with long-term current use of insulin [facility])   [place], Annie Main L, RPH-CPP   1 year ago Type 2 diabetes mellitus with hyperglycemia, with long-term current use of insulin [facility])   [place], Jarome Matin, RPH-CPP   1 year ago Type 2 diabetes mellitus with hyperglycemia, with long-term current use of insulin [facility])   [place], RPH-CPP      Future Appointments            In 2 months Dorna Mai, MD Primary Care at [facility]

## 2021-09-05 NOTE — Telephone Encounter (Signed)
Rx sent by provider's office today already.

## 2021-09-05 NOTE — Telephone Encounter (Signed)
Test strips refilled

## 2021-09-25 ENCOUNTER — Encounter: Payer: Self-pay | Admitting: Family Medicine

## 2021-10-03 ENCOUNTER — Telehealth: Payer: Self-pay

## 2021-10-03 NOTE — Telephone Encounter (Signed)
Patient contacted to schedule mammogram.  ?LVM for pt to call back as soon as possible.   ? ?RE: Mobile Mammo event located at: ? ?[facility]  ?      ?[address]    ?[place] [address]    ? ?Date: April 7th  ? ?

## 2021-10-09 ENCOUNTER — Telehealth: Payer: Self-pay | Admitting: Family Medicine

## 2021-10-09 NOTE — Telephone Encounter (Signed)
Patient contacted to schedule mammogram.  ? ?RE: Mobile Mammo event located at: ? ?[facility]  ?      ?[address]    ?[place] [address]    ? ?Date: April 7th at 10:20am ? ?

## 2021-10-09 NOTE — Telephone Encounter (Signed)
Copied from [place] [id number]. Topic: Referral - Request for Referral ?>> Oct 07, 2021  3:45 PM Erick Blinks wrote: ?Has patient seen PCP for this complaint? Yes.   ?*If NO, is insurance requiring patient see PCP for this issue before PCP can refer them? ?Referral for which specialty: Podiatry / Optometry  ?Preferred provider/office: [facility]  ?Reason for referral: Pt is type 2 diabetic. Needs to be evaluated ?

## 2021-10-10 ENCOUNTER — Other Ambulatory Visit: Payer: Self-pay | Admitting: *Deleted

## 2021-10-10 DIAGNOSIS — Z794 Long term (current) use of insulin: Secondary | ICD-10-CM

## 2021-10-10 NOTE — Telephone Encounter (Signed)
Patient referral has been put in ?

## 2021-10-10 NOTE — Progress Notes (Unsigned)
am

## 2021-10-17 ENCOUNTER — Other Ambulatory Visit: Payer: Self-pay | Admitting: Family Medicine

## 2021-10-17 DIAGNOSIS — Z139 Encounter for screening, unspecified: Secondary | ICD-10-CM

## 2021-10-22 ENCOUNTER — Ambulatory Visit (INDEPENDENT_AMBULATORY_CARE_PROVIDER_SITE_OTHER): Payer: Medicare Other | Admitting: Podiatry

## 2021-10-22 ENCOUNTER — Encounter: Payer: Self-pay | Admitting: Podiatry

## 2021-10-22 DIAGNOSIS — B351 Tinea unguium: Secondary | ICD-10-CM

## 2021-10-22 DIAGNOSIS — E1140 Type 2 diabetes mellitus with diabetic neuropathy, unspecified: Secondary | ICD-10-CM

## 2021-10-22 DIAGNOSIS — E1149 Type 2 diabetes mellitus with other diabetic neurological complication: Secondary | ICD-10-CM

## 2021-10-22 DIAGNOSIS — M79675 Pain in left toe(s): Secondary | ICD-10-CM

## 2021-10-22 DIAGNOSIS — M79674 Pain in right toe(s): Secondary | ICD-10-CM

## 2021-10-22 MED ORDER — GABAPENTIN 300 MG PO CAPS: 300.0000 mg | ORAL_CAPSULE | Freq: Three times a day (TID) | ORAL | 3 refills | Status: DC

## 2021-10-23 ENCOUNTER — Telehealth: Payer: Self-pay | Admitting: Family Medicine

## 2021-10-23 NOTE — Telephone Encounter (Signed)
Copied from [place] [id number]. Topic: General - Other ?>> Oct 23, 2021  3:52 PM Valere Dross wrote: ?Reason for CRM: Pt called in stating the eye doctor that she was referred to cant see her until August, but pt requested if PCP could send another referral somewhere else for her to be able to be seen sooner, please advise. ?

## 2021-10-23 NOTE — Progress Notes (Signed)
Subjective:  ? ?Patient ID: Emily Gray, female   DOB: 76 y.o.   MRN: [id number]  ? ?HPI ?Patient presents with caregiver with concerns about nail disease that she cannot take care of 1-5 both feet and generalized foot pain admitting that the gabapentin helped her but she had stopped taking it because the prescription ran out. ? ? ?ROS ? ? ?   ?Objective:  ?Physical Exam  ?Neurovascular status found to be same as previous with diminished sharp dull vibratory and upon questioning signs of neuropathic right distal condition within the digits and metatarsals.  Patient has significant thickness yellow brittle discoloration of nailbeds 1-5 both feet that are painful ? ?   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 ?   ?Plan:  ?H&P reviewed condition and discussed neuropathy and we will start her on gabapentin up to 3 times a day 300 mg starting with 1 at night for the next few weeks.  Also debrided nailbeds 1-5 both feet and this can be done educated on caregiver on daily inspections of her feet ?   ? ? ?

## 2021-10-24 ENCOUNTER — Ambulatory Visit
Admission: RE | Admit: 2021-10-24 | Discharge: 2021-10-24 | Disposition: A | Discharge status: Home / Self Care | Payer: Medicare Other | Source: Ambulatory Visit | Attending: Family Medicine | Admitting: Family Medicine

## 2021-10-24 ENCOUNTER — Ambulatory Visit: Payer: Medicare Other

## 2021-10-24 DIAGNOSIS — Z1231 Encounter for screening mammogram for malignant neoplasm of breast: Secondary | ICD-10-CM | POA: Diagnosis not present

## 2021-10-24 DIAGNOSIS — Z139 Encounter for screening, unspecified: Secondary | ICD-10-CM

## 2021-10-27 ENCOUNTER — Encounter: Payer: Self-pay | Admitting: Family Medicine

## 2021-10-27 NOTE — Telephone Encounter (Signed)
Patient was called and ask to see if there was a specific place she wanted to go to.If she could find one we would try and help her get in earlier. Patient verbalize understanding  ?

## 2021-10-31 ENCOUNTER — Encounter: Payer: Self-pay | Admitting: Family Medicine

## 2021-10-31 ENCOUNTER — Ambulatory Visit: Payer: Medicare Other | Admitting: Family Medicine

## 2021-10-31 ENCOUNTER — Telehealth: Payer: Self-pay | Admitting: Family Medicine

## 2021-10-31 NOTE — Telephone Encounter (Unsigned)
Copied from [place] [id number]. Topic: General - Other ?>> Oct 31, 2021 11:27 AM Tessa Lerner A wrote: ?Reason for CRM: The patient has called for additional guidance related to stopping their lisinopril-hydrochlorothiazide (ZESTORETIC) 20-12.5 MG tablet [[id number]]  ? ?The patient will be having oral surgery on 11/11/21 and has been directed by [place] to contact their PCP to determine if the patient will need to stop the medication prior to their surgery  ? ?Please contact the patient further to determine if discontinuation is needed ?

## 2021-10-31 NOTE — Telephone Encounter (Signed)
Call to patient- need to verify what medication oral surgeon is concerned about. Patient is going to call them and call PCP back because it may not be the BP medication that is the concern. Patient states it is a bone related medication. Expecting call back from patient. ?

## 2021-11-03 NOTE — Telephone Encounter (Signed)
See additional note in chart ?

## 2021-11-14 ENCOUNTER — Ambulatory Visit (INDEPENDENT_AMBULATORY_CARE_PROVIDER_SITE_OTHER): Payer: Medicare Other

## 2021-11-14 DIAGNOSIS — Z0000 Encounter for general adult medical examination without abnormal findings: Secondary | ICD-10-CM | POA: Diagnosis not present

## 2021-11-14 NOTE — Patient Instructions (Signed)

## 2021-11-14 NOTE — Progress Notes (Signed)
? ?Subjective:  ? Emily Gray is a 76 y.o. female who presents for an Initial Medicare Annual Wellness Visit. ? ?I connected with  Emily Gray on 11/14/21 by a audio enabled telemedicine application and verified that I am speaking with the correct person using two identifiers. ? ?Patient Location: Home ? ?Provider Location: Office/Clinic ? ?I discussed the limitations of evaluation and management by telemedicine. The patient expressed understanding and agreed to proceed. ? ?  ? ?   ?Objective:  ?  ?Today's Vitals  ? 11/14/21 1433  ?PainSc: 6   ? ?There is no height or weight on file to calculate BMI. ? ? ?  11/14/2021  ?  2:43 PM 08/08/2020  ?  1:42 PM 10/18/2018  ?  1:11 AM 10/17/2018  ?  8:47 PM  ?Advanced Directives  ?Does Patient Have a Medical Advance Directive? Yes;No No Yes No  ?Type of Comptroller;Living will   ?Does patient want to make changes to medical advance directive? No - Patient declined  No - Patient declined   ?Would patient like information on creating a medical advance directive?    No - Patient declined  ? ? ?Current Medications (verified) ?Outpatient Encounter Medications as of 11/14/2021  ?Medication Sig  ? aspirin 81 MG tablet Take 81 mg by mouth daily.  ? atorvastatin (LIPITOR) 20 MG tablet Take 1 tablet (20 mg total) by mouth daily.  ? Blood Glucose Monitoring Suppl (ONETOUCH VERIO REFLECT) w/Device KIT Use to check blood sugar TID. E11.65  ? cholecalciferol (VITAMIN D3) 25 MCG (1000 UT) tablet Take 1 tablet (1,000 Units total) by mouth daily.  ? Continuous Blood Gluc Sensor (FREESTYLE LIBRE 2 SENSOR) MISC Utilize as directed q2 weeks to monitor blood glucose - E11.9  ? gabapentin (NEURONTIN) 300 MG capsule Take 1 capsule (300 mg total) by mouth 3 (three) times daily.  ? glucose blood (ONETOUCH VERIO) test strip Use to check blood sugar TID. E11.65  ? ibuprofen (ADVIL) 600 MG tablet Take 1 tablet (600 mg total) by mouth every 8 (eight) hours as needed.  Use sparingly  ? insulin aspart (NOVOLOG) 100 UNIT/ML FlexPen Inject 14 Units into the skin 2 (two) times daily.  ? insulin glargine (LANTUS SOLOSTAR) 100 UNIT/ML Solostar Pen Inject 44 Units into the skin daily. Dx: E11.9  ? Insulin Pen Needle 32G X 4 MM MISC Use with insulin pen daily. Dx: E11.9  ? Lancets (ONETOUCH DELICA PLUS [id number]) MISC Use to check blood sugar TID. E11.65  ? lisinopril-hydrochlorothiazide (ZESTORETIC) 20-12.5 MG tablet Take 2 tablets by mouth daily.  ? ?No facility-administered encounter medications on file as of 11/14/2021.  ? ? ?Allergies (verified) ?Patient has no known allergies.  ? ?History: ?Past Medical History:  ?Diagnosis Date  ? Acid reflux   ? Arthritis   ? Breast cancer ([place])   ? Hypertension   ? Obesity, unspecified   ? Pure hyperglyceridemia   ? ?Past Surgical History:  ?Procedure Laterality Date  ? APPENDECTOMY    ? BILATERAL SALPINGOOPHORECTOMY    ? BREAST LUMPECTOMY Right   ? CATARACT EXTRACTION Left   ? EYE SURGERY Left 07/20/2008  ? macro-hole, filled with gel substance at [facility]  ? HYSTEROSCOPY WITH D & C  12/03/2004  ? TONSILLECTOMY    ? TUBAL LIGATION Bilateral   ? ?Family History  ?Problem Relation Age of Onset  ? Hypertension Mother   ? Cancer Father   ? Hypertension Father   ? Breast  cancer Sister   ? ?Social History  ? ?Socioeconomic History  ? Marital status: Married  ?  Spouse name: Not on file  ? Number of children: Not on file  ? Years of education: Not on file  ? Highest education level: Not on file  ?Occupational History  ? Not on file  ?Tobacco Use  ? Smoking status: Former  ? Smokeless tobacco: Never  ?Vaping Use  ? Vaping Use: Never used  ?Substance and Sexual Activity  ? Alcohol use: No  ? Drug use: No  ? Sexual activity: Not on file  ?Other Topics Concern  ? Not on file  ?Social History Narrative  ? Not on file  ? ?Social Determinants of Health  ? ?Financial Resource Strain: Low Risk   ? Difficulty of Paying Living Expenses: Not hard at all  ?Food  Insecurity: No Food Insecurity  ? Worried About Charity fundraiser in the Last Year: Never true  ? Ran Out of Food in the Last Year: Never true  ?Transportation Needs: No Transportation Needs  ? Lack of Transportation (Medical): No  ? Lack of Transportation (Non-Medical): No  ?Physical Activity: Insufficiently Active  ? Days of Exercise per Week: 2 days  ? Minutes of Exercise per Session: 30 min  ?Stress: Stress Concern Present  ? Feeling of Stress : Very much  ?Social Connections: Moderately Isolated  ? Frequency of Communication with Friends and Family: More than three times a week  ? Frequency of Social Gatherings with Friends and Family: More than three times a week  ? Attends Religious Services: Never  ? Active Member of Clubs or Organizations: No  ? Attends Archivist Meetings: Never  ? Marital Status: Married  ? ? ?Tobacco Counseling ?Counseling given: Not Answered ? ? ?Clinical Intake: ? ?Pre-visit preparation completed: Yes ? ?Pain : 0-10 ?Pain Score: 6  ?Pain Type: Chronic pain ?Pain Location: Knee ?Pain Orientation: Left ?Pain Onset: More than a month ago ?Pain Frequency: Constant ? ?  ? ?Diabetes: Yes ?CBG done?: No (150) ?Did pt. bring in CBG monitor from home?: No ? ?How often do you need to have someone help you when you read instructions, pamphlets, or other written materials from your doctor or pharmacy?: 4 - Often ?What is the last grade level you completed in school?: 12th grade ? ?Diabetic?yes ?Nutrition Risk Assessment: ? ?Has the patient had any N/V/D within the last 2 months?  Yes  ?Does the patient have any non-healing wounds?  No  ?Has the patient had any unintentional weight loss or weight gain?  No  ? ?Diabetes: ? ?Is the patient diabetic?  Yes  ?If diabetic, was a CBG obtained today?  Yes  ?Did the patient bring in their glucometer from home?  No  ?How often do you monitor your CBG's? Twice daily .  ? ?Financial Strains and Diabetes Management: ? ?Are you having any financial  strains with the device, your supplies or your medication? No .  ?Does the patient want to be seen by Chronic Care Management for management of their diabetes?  No  ?Would the patient like to be referred to a Nutritionist or for Diabetic Management?  No  ? ?Diabetic Exams: ? ?Diabetic Eye Exam: Overdue for diabetic eye exam. Pt has been advised about the importance in completing this exam. Patient advised to call and schedule an eye exam. ?Diabetic Foot Exam: Completed   ? ?Interpreter Needed?: No ? ?  ? ? ?Activities of Daily Living ? ?  11/14/2021  ?  2:44 PM 11/15/2020  ? 11:14 AM  ?In your present state of health, do you have any difficulty performing the following activities:  ?Hearing? 0 0  ?Vision? 1 0  ?Difficulty concentrating or making decisions? 1 0  ?Walking or climbing stairs? 1 1  ?Dressing or bathing? 1 0  ?Doing errands, shopping? 1 0  ?Preparing Food and eating ? N   ?Using the Toilet? N   ?In the past six months, have you accidently leaked urine? N   ?Do you have problems with loss of bowel control? N   ?Managing your Medications? N   ?Managing your Finances? Y   ?Housekeeping or managing your Housekeeping? N   ? ? ?Patient Care Team: ?Dorna Mai, MD as PCP - General (Family Medicine) ? ?Indicate any recent Medical Services you may have received from other than [facility] providers in the past year (date may be approximate). ? ?   ?Assessment:  ? This is a routine wellness examination for Emily Gray. ? ?Hearing/Vision screen ?No results found. ? ?Dietary issues and exercise activities discussed: ?Exercise limited by: Other - see comments ? ? Goals Addressed   ? ?  ?  ?  ?  ? This Visit's Progress  ?  Increase physical activity     ?  Lose weight  ?  ? ?  ?Depression Screen ? ?  11/14/2021  ?  2:38 PM 09/03/2021  ?  1:51 PM 06/02/2021  ?  1:22 PM 02/28/2021  ? 10:35 AM 08/08/2020  ?  1:42 PM 02/01/2020  ?  3:53 PM 01/30/2019  ? 11:50 AM  ?PHQ 2/9 Scores  ?PHQ - 2 Score 5 0 1 0 0 0 0  ?PHQ- 9 Score 17 0 5 0   0  ?   ?Fall Risk ? ?  11/14/2021  ?  2:44 PM 11/15/2020  ? 11:13 AM 08/08/2020  ?  1:41 PM  ?Fall Risk   ?Falls in the past year? 0 1 1  ?Number falls in past yr: 0 1 1  ?Injury with Fall? 0 1 1  ?Risk for fall due to

## 2021-11-17 ENCOUNTER — Other Ambulatory Visit: Payer: Self-pay | Admitting: *Deleted

## 2021-11-17 NOTE — Patient Outreach (Signed)
[facility]) Care Management ? ?11/17/2021 ? ?Emily Gray ?[birth year] ?[id number] ? ? ?Referral Received 11/17/2021 ?Initial Outreach: 11/17/2021 ? ?RN spoke with pt today introduced [facility] services and the purpose for today's call. Pt receptive however unable to talk and requested to call RN case manager back on tomorrow. RN able to provide a call back number and will further discuss enrollment services on the call back. ? ?Pt is aware [facility] works with her provider for case management services with ongoing communication. Will anticipate a call back tomorrow. ? ?Raina Mina, RN ?Care Management Coordinator ?[place] ?Main Office [phone]  ?

## 2021-11-17 NOTE — Patient Outreach (Signed)
Received a referral for Ms. Emily Gray . ?I have assigned Raina Mina, RN to call for follow up and determine if there are any Case Management needs.  ?  ?Arville Care, CBCS, CMAA ?[place] Management Assistant ?[place] [facility] ?[phone]   ?

## 2021-11-18 ENCOUNTER — Other Ambulatory Visit: Payer: Medicare Other | Admitting: *Deleted

## 2021-11-18 ENCOUNTER — Encounter: Payer: Self-pay | Admitting: *Deleted

## 2021-11-18 ENCOUNTER — Other Ambulatory Visit: Payer: Self-pay | Admitting: *Deleted

## 2021-11-18 NOTE — Patient Instructions (Signed)
Visit Information ? ?Thank you for taking time to visit with me today. Please don't hesitate to contact me if I can be of assistance to you before our next scheduled telephone appointment. ? ?Following are the goals we discussed today:  ?Take all medications as prescribed ?Attend all scheduled provider appointments ?Call pharmacy for medication refills 3-7 days in advance of running out of medications ?Attend church or other social activities ?Perform all self care activities independently  ?Perform IADL's (shopping, preparing meals, housekeeping, managing finances) independently ?Call provider office for new concerns or questions  ?keep appointment with eye doctor ?schedule appointment with eye doctor ?check blood sugar at prescribed times: twice daily ?check feet daily for cuts, sores or redness ?enter blood sugar readings and medication or insulin into daily log ?take the blood sugar log to all doctor visits ?set goal weight ?trim toenails straight across ?drink 6 to 8 glasses of water each day ?eat fish at least once per week ?fill half of plate with vegetables ?keep a food diary ?limit fast food meals to no more than 1 per week ?manage portion size ?prepare main meal at home 3 to 5 days each week ?set a realistic goal ?switch to low-fat or skim milk ?switch to sugar-free drinks ?do heel pump exercise 2 to 3 times each day ?keep feet up while sitting ?wash and dry feet carefully every day ?wear comfortable, cotton socks ?wear comfortable, well-fitting shoes  ?

## 2021-11-18 NOTE — Patient Outreach (Signed)
[place]) Care Management ? ?11/18/2021 ? ?Emily Gray ?09-03-1945 ?[id number] ? ? ?Telephone Assessment ? ?RN returned a call to pt who requested a call back at 1:30 pm today. RN will follow up once again with another outreach for possible enrollment. ? ?Raina Mina, RN ?Care Management Coordinator ?[place] ?Main Office [phone]  ?

## 2021-11-18 NOTE — Addendum Note (Signed)
Addended by: Edgar Frisk on: 11/18/2021 10:28 AM ? ? Modules accepted: Orders ? ?

## 2021-11-18 NOTE — Patient Outreach (Signed)
[facility]) Care Management Telephonic RN Care Manager Note   11/18/2021 Name:  Emily Gray MRN:  [id number] DOB:  1945-09-09  Summary: Enrolled pt into the [facility] program and services. Completed the initial assessment and inquired further on pt's needs. Will educate on Diabetes and how to better manage this condition based upon pt's recent A1C was reported at 7.6 and today CBG at 152 with no acute symptoms or issues. Pt states she has decided on no to utilize the [place] system and will continue to use her flex pin for insulin administration. Will alert provider of pt's disposition with [facility] services.  Recommendations/Changes made from today's visit: Encouraged adhere to the discussed plan of care with pt's understanding. All inquires addressed and verified pt has a good support system both in the home and a friend who can assist with outside the home needs.  Subjective: Emily Gray is an 76 y.o. year old female who is a primary patient of Emily Skeans, MD. The care management team was consulted for assistance with care management and/or care coordination needs.    Telephonic RN Care Manager completed Telephone Visit today.  Objective:   Medications Reviewed Today     Reviewed by Alejandro Mulling, RN (Registered Nurse) on 11/18/21 at 1344  Med List Status: <None>   Medication Order Taking? Sig Documenting Provider Last Dose Status Informant  alendronate (FOSAMAX) 70 MG tablet [id number] Yes Take 70 mg by mouth once a week. Take with a full glass of water on an empty stomach. [provider] Taking Active   aspirin 81 MG tablet [id number] Yes Take 81 mg by mouth daily. [provider] Taking Active Self  atorvastatin (LIPITOR) 20 MG tablet [id number] Yes Take 1 tablet (20 mg total) by mouth daily. Emily Skeans, MD Taking Active   Blood Glucose Monitoring Suppl [place] VERIO REFLECT) w/Device Andria Rhein [id number] Yes Use to check blood sugar TID. E11.65 Hoy Register, MD Taking Active   cholecalciferol (VITAMIN D3) 25 MCG (1000 UT) tablet [id number] Yes Take 1 tablet (1,000 Units total) by mouth daily. Calvert Cantor, MD Taking Active   Continuous Blood Gluc Sensor (FREESTYLE LIBRE 2 SENSOR) [place] [id number] No Utilize as directed q2 weeks to monitor blood glucose - E11.9  Patient not taking: Reported on 11/18/2021   Emily Skeans, MD Not Taking Active   gabapentin (NEURONTIN) 300 MG capsule [id number] Yes Take 1 capsule (300 mg total) by mouth 3 (three) times daily. Lenn Sink, DPM Taking Active   glucose blood (ONETOUCH VERIO) test strip [id number] Yes Use to check blood sugar TID. E11.65 Emily Skeans, MD Taking Active   ibuprofen (ADVIL) 600 MG tablet [id number] Yes Take 1 tablet (600 mg total) by mouth every 8 (eight) hours as needed. Use sparingly Anders Simmonds, PA-C Taking Active   insulin aspart (NOVOLOG) 100 UNIT/ML FlexPen [id number] No Inject 14 Units into the skin 2 (two) times daily.  Patient not taking: Reported on 11/18/2021   Emily Skeans, MD Not Taking Active   insulin glargine (LANTUS SOLOSTAR) 100 UNIT/ML Solostar Pen [id number] Yes Inject 44 Units into the skin daily. Dx: E11.9  Patient taking differently: Inject 44 Units into the skin daily. Dx: E11.9 Taking differently 40 units daily   Emily Skeans, MD Taking Active   Insulin Pen Needle 32G X 4 MM MISC [id number] Yes Use with insulin pen daily. Dx: E11.9 Arvilla Market, MD Taking Active   Lancets [facility] [place] PLUS [place]) MISC [id number] Yes Use to check  blood sugar TID. E11.65 Arvilla Market, MD Taking Active   lisinopril-hydrochlorothiazide (ZESTORETIC) 20-12.5 MG tablet [id number] Yes Take 2 tablets by mouth daily. Emily Skeans, MD Taking Active              SDOH:  (Social Determinants of Health) assessments and interventions performed:  SDOH Interventions    Flowsheet Row Most Recent Value  SDOH Interventions   Food Insecurity  Interventions Intervention Not Indicated  Transportation Interventions Intervention Not Indicated        Care Plan  Review of patient past medical history, allergies, medications, health status, including review of consultants reports, laboratory and other test data, was performed as part of comprehensive evaluation for care management services.   Care Plan : RN Care Manager Plan of Care  Updates made by Alejandro Mulling, RN since 11/18/2021 12:00 AM     Problem: Knowledge deficit related to Diabetes and care coordination needs   Priority: High     Long-Range Goal: Development plan of care for management of Diabetes   Start Date: 11/18/2021  Expected End Date: 06/18/2022  Priority: High  Note:   Current Barriers:  Knowledge Deficits related to plan of care for management of DMII   RNCM Clinical Goal(s):  Patient will verbalize basic understanding of  DMII disease process and self health management plan as evidenced by self report take all medications exactly as prescribed and will call provider for medication related questions as evidenced by chart reviewed and self report  through collaboration with RN Care manager, provider, and care team.   Interventions: Inter-disciplinary care team collaboration (see longitudinal plan of care) Evaluation of current treatment plan related to  self management and patient's adherence to plan as established by provider   Diabetes Interventions:  (Status:  New goal.) Long Term Goal Assessed patient's understanding of A1c goal: <6.5% Provided education to patient about basic DM disease process Reviewed medications with patient and discussed importance of medication adherence Counseled on importance of regular laboratory monitoring as prescribed Discussed plans with patient for ongoing care management follow up and provided patient with direct contact information for care management team Provided patient with written educational materials related  to hypo and hyperglycemia and importance of correct treatment Reviewed scheduled/upcoming provider appointments including: PCP May 12 and Foot provider 12/02/2021 Advised patient, providing education and rationale, to check cbg as prescribed and record, calling provider (Dr. Andrey Campanile) for findings outside established parameters Screening for signs and symptoms of depression related to chronic disease state  Assessed social determinant of health barriers Lab Results  Component Value Date   HGBA1C 7.4 (A) 06/02/2021   Patient Goals/Self-Care Activities: Take all medications as prescribed Attend all scheduled provider appointments Call pharmacy for medication refills 3-7 days in advance of running out of medications Attend church or other social activities Perform all self care activities independently  Perform IADL's (shopping, preparing meals, housekeeping, managing finances) independently Call provider office for new concerns or questions  keep appointment with eye doctor schedule appointment with eye doctor check blood sugar at prescribed times: twice daily check feet daily for cuts, sores or redness enter blood sugar readings and medication or insulin into daily log take the blood sugar log to all doctor visits set goal weight trim toenails straight across drink 6 to 8 glasses of water each day eat fish at least once per week fill half of plate with vegetables keep a food diary limit fast food meals to no more than 1 per week manage  portion size prepare main meal at home 3 to 5 days each week set a realistic goal switch to low-fat or skim milk switch to sugar-free drinks do heel pump exercise 2 to 3 times each day keep feet up while sitting wash and dry feet carefully every day wear comfortable, cotton socks wear comfortable, well-fitting shoes  Follow Up Plan:  Telephone follow up appointment with care management team member scheduled for:  May 2023 The patient has been  provided with contact information for the care management team and has been advised to call with any health related questions or concerns.        Elliot Cousin, RN Care Management Coordinator [facility] Main Office [phone]

## 2021-11-19 ENCOUNTER — Other Ambulatory Visit: Payer: Self-pay | Admitting: Family Medicine

## 2021-11-19 ENCOUNTER — Encounter: Payer: Self-pay | Admitting: Family Medicine

## 2021-11-19 DIAGNOSIS — E1165 Type 2 diabetes mellitus with hyperglycemia: Secondary | ICD-10-CM

## 2021-11-19 MED ORDER — LANTUS SOLOSTAR 100 UNIT/ML ~~LOC~~ SOPN: 44.0000 [IU] | PEN_INJECTOR | Freq: Every day | SUBCUTANEOUS | 2 refills | Status: DC

## 2021-11-24 ENCOUNTER — Telehealth: Payer: Self-pay | Admitting: Family Medicine

## 2021-11-24 NOTE — Telephone Encounter (Signed)
Pt called to see if her insulin can be changed to ozempic so she can also get some of the weight off / please advise  ?

## 2021-11-25 ENCOUNTER — Other Ambulatory Visit: Payer: Self-pay | Admitting: *Deleted

## 2021-11-25 DIAGNOSIS — E1165 Type 2 diabetes mellitus with hyperglycemia: Secondary | ICD-10-CM

## 2021-11-25 MED ORDER — LANTUS SOLOSTAR 100 UNIT/ML ~~LOC~~ SOPN: 44.0000 [IU] | PEN_INJECTOR | Freq: Every day | SUBCUTANEOUS | 2 refills | Status: DC

## 2021-11-28 ENCOUNTER — Ambulatory Visit (INDEPENDENT_AMBULATORY_CARE_PROVIDER_SITE_OTHER): Payer: Medicare Other | Admitting: Family Medicine

## 2021-11-28 ENCOUNTER — Encounter: Payer: Self-pay | Admitting: Family Medicine

## 2021-11-28 VITALS — BP 138/83 | HR 82 | Temp 98.6°F | Resp 16 | Wt 244.7 lb

## 2021-11-28 DIAGNOSIS — M2550 Pain in unspecified joint: Secondary | ICD-10-CM | POA: Diagnosis not present

## 2021-11-28 DIAGNOSIS — Z7189 Other specified counseling: Secondary | ICD-10-CM | POA: Insufficient documentation

## 2021-11-28 DIAGNOSIS — E1165 Type 2 diabetes mellitus with hyperglycemia: Secondary | ICD-10-CM

## 2021-11-28 DIAGNOSIS — E782 Mixed hyperlipidemia: Secondary | ICD-10-CM

## 2021-11-28 DIAGNOSIS — Z794 Long term (current) use of insulin: Secondary | ICD-10-CM

## 2021-11-28 DIAGNOSIS — I10 Essential (primary) hypertension: Secondary | ICD-10-CM

## 2021-11-28 DIAGNOSIS — Z0189 Encounter for other specified special examinations: Secondary | ICD-10-CM | POA: Insufficient documentation

## 2021-11-28 LAB — POCT GLYCOSYLATED HEMOGLOBIN (HGB A1C): Hemoglobin A1C: 8.5 % — AB (ref 4.0–5.6)

## 2021-11-28 MED ORDER — TRIAMCINOLONE ACETONIDE 40 MG/ML IJ SUSP
40.0000 mg | Freq: Once | INTRAMUSCULAR | Status: AC
  Administered 2021-11-28: 40 mg via INTRAMUSCULAR
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
## 2021-11-28 NOTE — Progress Notes (Signed)
Patient is  here for her 3 month follow-up.chronic issues. Patient would like to change her DMII medication  to ozempic to also help with weight loss. ? ?

## 2021-12-01 ENCOUNTER — Ambulatory Visit: Payer: Medicare Other | Admitting: Family Medicine

## 2021-12-05 DIAGNOSIS — G4733 Obstructive sleep apnea (adult) (pediatric): Secondary | ICD-10-CM | POA: Diagnosis not present

## 2021-12-05 DIAGNOSIS — I10 Essential (primary) hypertension: Secondary | ICD-10-CM | POA: Diagnosis not present

## 2021-12-05 DIAGNOSIS — Z6838 Body mass index (BMI) 38.0-38.9, adult: Secondary | ICD-10-CM | POA: Diagnosis not present

## 2021-12-18 ENCOUNTER — Other Ambulatory Visit: Payer: Self-pay | Admitting: *Deleted

## 2021-12-18 NOTE — Patient Outreach (Signed)
[facility]) Care Management Telephonic RN Care Manager Note   12/18/2021 Name:  Emily Gray MRN:  [id number] DOB:  1946-02-15  Summary: Pt continue to do well however will incorporate a Libra system device for better readings in regulating her glucose levels. Pt states this will be applied this month through her provider. Pt reports her readings are currently around 135-145 with no acute issues or symptoms.  Recommendations/Changes made from today's visit: Plan of care reviewed and discussed. Encouraged pt to continue to adhere to the plan of care and address all inquires today with no additional needs. Will follow up accordingly next month for pt's progress.   Subjective: Emily Gray is an 76 y.o. year old female who is a primary patient of Dorna Mai, MD. The care management team was consulted for assistance with care management and/or care coordination needs.    Telephonic RN Care Manager completed Telephone Visit today.  Objective:   Medications Reviewed Today     Reviewed by Dorna Mai, MD (Physician) on 11/28/21 at 1109  Med List Status: <None>   Medication Order Taking? Sig Documenting Provider Last Dose Status Informant  alendronate (FOSAMAX) 70 MG tablet [id number] No Take 70 mg by mouth once a week. Take with a full glass of water on an empty stomach.  Patient not taking: Reported on 11/28/2021   [provider] Not Taking Active   aspirin 81 MG tablet [id number] Yes Take 81 mg by mouth daily. [provider] Taking Active Self  atorvastatin (LIPITOR) 20 MG tablet [id number] Yes Take 1 tablet (20 mg total) by mouth daily. Dorna Mai, MD Taking Active   Blood Glucose Monitoring Suppl [facility] VERIO REFLECT) w/Device Drucie Opitz [id number] Yes Use to check blood sugar TID. E11.65 Charlott Rakes, MD Taking Active   cholecalciferol (VITAMIN D3) 25 MCG (1000 UT) tablet [id number] Yes Take 1 tablet (1,000 Units total) by mouth daily. Debbe Odea, MD Taking Active   Continuous Blood Gluc Sensor (FREESTYLE LIBRE 2 SENSOR) [place] [id number] No Utilize as directed q2 weeks to monitor blood glucose - E11.9  Patient not taking: Reported on 11/18/2021   Dorna Mai, MD Not Taking Active   gabapentin (NEURONTIN) 300 MG capsule [id number] Yes Take 1 capsule (300 mg total) by mouth 3 (three) times daily. Wallene Huh, DPM Taking Active   glucose blood (ONETOUCH VERIO) test strip [id number] Yes Use to check blood sugar TID. E11.65 Dorna Mai, MD Taking Active   ibuprofen (ADVIL) 600 MG tablet [id number] Yes Take 1 tablet (600 mg total) by mouth every 8 (eight) hours as needed. Use sparingly Argentina Donovan, PA-C Taking Active   insulin aspart (NOVOLOG) 100 UNIT/ML FlexPen [id number] Yes Inject 14 Units into the skin 2 (two) times daily. Dorna Mai, MD Taking Active   insulin glargine (LANTUS SOLOSTAR) 100 UNIT/ML Solostar Pen [id number] Yes Inject 44 Units into the skin daily. Dx: E11.9 Dorna Mai, MD Taking Active   Insulin Pen Needle 32G X 4 MM MISC [id number] Yes Use with insulin pen daily. Dx: E11.9 Nicolette Bang, MD Taking Active   Lancets (ONETOUCH DELICA PLUS [id number]) [place] [id number] Yes Use to check blood sugar TID. E11.65 Nicolette Bang, MD Taking Active   lisinopril-hydrochlorothiazide (ZESTORETIC) 20-12.5 MG tablet [id number] Yes Take 2 tablets by mouth daily. Dorna Mai, MD Taking Active              SDOH:  (Social Determinants of Health) assessments and interventions performed:     Care Plan  Review of patient past medical history, allergies, medications, health status, including review of consultants reports, laboratory and other test data, was performed as part of comprehensive evaluation for care management services.   Care Plan : RN Care Manager Plan of Care  Updates made by Tobi Bastos, RN since 12/18/2021 12:00 AM     Problem: Knowledge deficit related to Diabetes and care  coordination needs   Priority: High     Long-Range Goal: Development plan of care for management of Diabetes   Start Date: 11/18/2021  Expected End Date: 06/18/2022  This Visit's Progress: On track  Priority: High  Note:   Current Barriers:  Knowledge Deficits related to plan of care for management of DMII   RNCM Clinical Goal(s):  Patient will verbalize basic understanding of  DMII disease process and self health management plan as evidenced by self report take all medications exactly as prescribed and will call provider for medication related questions as evidenced by chart reviewed and self report  through collaboration with RN Care manager, provider, and care team.   Interventions: Inter-disciplinary care team collaboration (see longitudinal plan of care) Evaluation of current treatment plan related to  self management and patient's adherence to plan as established by provider   Diabetes Interventions:  (Status:  New goal.) Long Term Goal Assessed patient's understanding of A1c goal: <6.5% Provided education to patient about basic DM disease process Reviewed medications with patient and discussed importance of medication adherence Counseled on importance of regular laboratory monitoring as prescribed Discussed plans with patient for ongoing care management follow up and provided patient with direct contact information for care management team Provided patient with written educational materials related to hypo and hyperglycemia and importance of correct treatment Reviewed scheduled/upcoming provider appointments including: PCP May 12 and Foot provider 12/02/2021 Advised patient, providing education and rationale, to check cbg as prescribed and record, calling provider (Dr. Redmond Pulling) for findings outside established parameters Screening for signs and symptoms of depression related to chronic disease state  Assessed social determinant of health barriers Lab Results  Component Value Date    HGBA1C 7.4 (A) 06/02/2021  Update 12/18/2021: Spoke with pt who continues to manager her ongoing diabetes. Will soon add Libra device and continue to reduce her carbohydrates to improve her ongoing CBG current ranging 130-145 with no acute symptoms. Will review the current plan of care and continue to encourage adherence. Verified pt received [facility] tools for ongoing daily monitoring and education has been provided today on dietary habits for improving her overall readings. Pt verbalized an understanding and continue to have a supportive spouse to assist as he also has this system and educates pt accordingly. No other issues to address as RN praise pt for her ongoing management of care related to her diabetes.  Patient Goals/Self-Care Activities: Take all medications as prescribed Attend all scheduled provider appointments Call pharmacy for medication refills 3-7 days in advance of running out of medications Attend church or other social activities Perform all self care activities independently  Perform IADL's (shopping, preparing meals, housekeeping, managing finances) independently Call provider office for new concerns or questions  keep appointment with eye doctor schedule appointment with eye doctor check blood sugar at prescribed times: twice daily check feet daily for cuts, sores or redness enter blood sugar readings and medication or insulin into daily log take the blood sugar log to all doctor visits set goal weight trim toenails straight across drink 6 to 8 glasses of water each day eat fish  at least once per week fill half of plate with vegetables keep a food diary limit fast food meals to no more than 1 per week manage portion size prepare main meal at home 3 to 5 days each week set a realistic goal switch to low-fat or skim milk switch to sugar-free drinks do heel pump exercise 2 to 3 times each day keep feet up while sitting wash and dry feet carefully every day wear  comfortable, cotton socks wear comfortable, well-fitting shoes  Follow Up Plan:  Telephone follow up appointment with care management team member scheduled for:  July 2023 The patient has been provided with contact information for the care management team and has been advised to call with any health related questions or concerns.        Raina Mina, RN Care Management Coordinator [place] Office [phone]

## 2021-12-30 NOTE — Progress Notes (Signed)
S:    Emily Gray is a 76 y.o. female who presents for diabetes evaluation, education, and management. PMH is significant for T2DM, HTN, OSA. Patient was referred and last seen by Primary Care Provider, Dr. Redmond Pulling, on 11/28/21. At last visit, A1c had increased to 8.5. No medication changes made, referred to pharmacist for management.   Today, patient arrives in good spirits and presents without any assistance. She reports that DM is longstanding. Is only taking Lantus. Metformin not tolerated in the past - "made me sick". Denies any pancreatitis history or personal/fhx of thyroid cancer.   Family/Social History: Former smoker. HTN in mother, father.   Current diabetes medications include: Lantus 44 units daily, Novolog 14 units BID (not taking) Current hypertension medications include: lisinopril-HCTZ 40-25 mg daily (two 20-25 mg tablets) Current hyperlipidemia medications include: atorvastatin 20 mg daily  Patient reports adherence with Lantus but not Novolog. Patient reports missing her medications 0 times per week, on average.  Do you feel that your medications are working for you? yes Have you been experiencing any side effects to the medications prescribed? no Do you have any problems obtaining medications due to transportation or finances? no Insurance coverage: Medicare   Patient denies hypoglycemic events.  Reported home fasting blood sugars: none   Reported 2 hour post-meal/random blood sugars: none Brings her libre in for placement   Patient denies nocturia (nighttime urination).  Patient denies neuropathy (nerve pain). Patient reports visual changes. Patient denies self foot exams.   Patient reported dietary habits:  - Eats 2 meals daily; skips lunch - Recently trying to improve her diet; she has "cut out bread and soda"   Patient-reported exercise habits:  - Walks w/ a walker in her garage 2x daily   O:   7 day average blood glucose: no meter with her today    Lab Results  Component Value Date   HGBA1C 8.5 (A) 11/28/2021   There were no vitals filed for this visit.  Lipid Panel     Component Value Date/Time   CHOL 133 08/09/2019 1601   TRIG 69 08/09/2019 1601   HDL 36 (L) 08/09/2019 1601   CHOLHDL 3.7 08/09/2019 1601   LDLCALC 83 08/09/2019 1601   LDLDIRECT 85 08/08/2020 1419   Clinical Atherosclerotic Cardiovascular Disease (ASCVD): No  The 10-year ASCVD risk score (Arnett DK, et al., 2019) is: 22.3%   Values used to calculate the score:     Age: 16 years     Sex: Female     Is Non-Hispanic African American: Yes     Diabetic: Yes     Tobacco smoker: No     Systolic Blood Pressure: 892 mmHg     Is BP treated: Yes     HDL Cholesterol: 36 mg/dL     Total Cholesterol: 133 mg/dL   A/P: Diabetes longstanding currently uncontrolled. Patient is able to verbalize appropriate hypoglycemia management plan. Medication adherence appears appropriate. -Start Ozempic 0.25 mg once weekly. She may increase to 0.5 mg weekly after 4 weeks if tolerated.  -Patient was educated on the use of the Ozempic pen. Reviewed necessary supplies and operation of the pen.  -Patient educated on purpose, proper use, and potential adverse effects of Ozempic.  -Continue Lantus at 44u daily for now.  -Extensively discussed pathophysiology of diabetes, recommended lifestyle interventions, dietary effects on blood sugar control.  -Counseled on s/sx of and management of hypoglycemia.  -Next A1c anticipated 02/2022.   Written patient instructions provided. Patient verbalized  understanding of treatment plan. Total time in face to face counseling 30 minutes.    Follow up pharmacist clinic visit in 1 month.  Benard Halsted, PharmD, [place], [place] [phone]

## 2022-01-05 ENCOUNTER — Encounter: Payer: Self-pay | Admitting: Pharmacist

## 2022-01-05 ENCOUNTER — Encounter: Payer: Self-pay | Admitting: Family Medicine

## 2022-01-05 ENCOUNTER — Ambulatory Visit: Payer: Medicare Other | Attending: Family Medicine | Admitting: Pharmacist

## 2022-01-05 ENCOUNTER — Other Ambulatory Visit: Payer: Self-pay

## 2022-01-05 DIAGNOSIS — E1165 Type 2 diabetes mellitus with hyperglycemia: Secondary | ICD-10-CM | POA: Diagnosis not present

## 2022-01-05 DIAGNOSIS — G4733 Obstructive sleep apnea (adult) (pediatric): Secondary | ICD-10-CM | POA: Diagnosis not present

## 2022-01-05 DIAGNOSIS — E119 Type 2 diabetes mellitus without complications: Secondary | ICD-10-CM | POA: Insufficient documentation

## 2022-01-05 DIAGNOSIS — I10 Essential (primary) hypertension: Secondary | ICD-10-CM | POA: Diagnosis not present

## 2022-01-05 DIAGNOSIS — Z794 Long term (current) use of insulin: Secondary | ICD-10-CM | POA: Diagnosis not present

## 2022-01-05 DIAGNOSIS — Z79899 Other long term (current) drug therapy: Secondary | ICD-10-CM | POA: Insufficient documentation

## 2022-01-05 MED ORDER — SEMAGLUTIDE(0.25 OR 0.5MG/DOS) 2 MG/3ML ~~LOC~~ SOPN
0.2500 mg | PEN_INJECTOR | SUBCUTANEOUS | 1 refills | Status: DC
  Filled 2022-01-05: qty 2, fill #0
  Filled 2022-01-05: qty 3, 28d supply, fill #0

## 2022-01-05 MED ORDER — SEMAGLUTIDE(0.25 OR 0.5MG/DOS) 2 MG/3ML ~~LOC~~ SOPN: 0.2500 mg | PEN_INJECTOR | SUBCUTANEOUS | 1 refills | Status: DC

## 2022-01-05 MED ORDER — FREESTYLE LIBRE 2 SENSOR MISC
1 refills | Status: DC
Start: 2022-01-05 — End: 2022-05-20

## 2022-01-19 ENCOUNTER — Other Ambulatory Visit: Payer: Self-pay | Admitting: *Deleted

## 2022-01-19 NOTE — Patient Outreach (Addendum)
[place] [facility]) Care Management Telephonic RN Care Manager Note   01/19/2022 Name:  Emily Gray MRN:  [id number] DOB:  1946-05-18  Summary: Voice message received back from member and friend/neighbor Celeste.  Call placed, successful.  Denies any urgent concerns, encouraged to contact this care manager with questions.    Recommendations/Changes made from today's visit: Contact PCP office to have refills for [facility] monitoring sensors sent to pharmacy for recurring refills.   Subjective: Emily Gray is an 76 y.o. year old female who is a primary patient of Dorna Mai, MD. The care management team was consulted for assistance with care management and/or care coordination needs.    Telephonic RN Care Manager completed Telephone Visit today.  Objective:   Medications Reviewed Today     Reviewed by Daisy Blossom, Jarome Matin, RPH-CPP (Pharmacist) on 01/05/22 at 31  Med List Status: <None>   Medication Order Taking? Sig Documenting Provider Last Dose Status Informant  alendronate (FOSAMAX) 70 MG tablet [id number] No Take 70 mg by mouth once a week. Take with a full glass of water on an empty stomach.  Patient not taking: Reported on 11/28/2021   [provider] Not Taking Active   aspirin 81 MG tablet [id number] No Take 81 mg by mouth daily. [provider] Taking Active Self  atorvastatin (LIPITOR) 20 MG tablet [id number] No Take 1 tablet (20 mg total) by mouth daily. Dorna Mai, MD Taking Active   Blood Glucose Monitoring Suppl [facility] VERIO REFLECT) w/Device Drucie Opitz [id number] No Use to check blood sugar TID. E11.65 Charlott Rakes, MD Taking Active   cholecalciferol (VITAMIN D3) 25 MCG (1000 UT) tablet [id number] No Take 1 tablet (1,000 Units total) by mouth daily. Debbe Odea, MD Taking Active   Continuous Blood Gluc Sensor (FREESTYLE LIBRE 2 SENSOR) [place] [id number]  Utilize as directed q2 weeks to monitor blood glucose - E11.9 Charlott Rakes,  MD  Active   gabapentin (NEURONTIN) 300 MG capsule [id number] No Take 1 capsule (300 mg total) by mouth 3 (three) times daily. Wallene Huh, DPM Taking Active   glucose blood (ONETOUCH VERIO) test strip [id number] No Use to check blood sugar TID. E11.65 Dorna Mai, MD Taking Active   ibuprofen (ADVIL) 600 MG tablet [id number] No Take 1 tablet (600 mg total) by mouth every 8 (eight) hours as needed. Use sparingly Argentina Donovan, PA-C Taking Active   insulin glargine (LANTUS SOLOSTAR) 100 UNIT/ML Solostar Pen [id number] No Inject 44 Units into the skin daily. Dx: E11.9 Dorna Mai, MD Taking Active   Insulin Pen Needle 32G X 4 MM MISC [id number] No Use with insulin pen daily. Dx: E11.9 Nicolette Bang, MD Taking Active   Lancets (ONETOUCH DELICA PLUS [id number]) [place] [id number] No Use to check blood sugar TID. E11.65 Nicolette Bang, MD Taking Active   lisinopril-hydrochlorothiazide (ZESTORETIC) 20-12.5 MG tablet [id number] No Take 2 tablets by mouth daily. Dorna Mai, MD Taking Active   Semaglutide,0.25 or 0.5MG /DOS, 2 MG/3ML [place] [facility] [id number]  Inject 0.25 mg into the skin once a week. If tolerable after 4 weeks, increase to 0.5 mg once weekly. Charlott Rakes, MD  Active              SDOH:  (Social Determinants of Health) assessments and interventions performed:     Care Plan  Review of patient past medical history, allergies, medications, health status, including review of consultants reports, laboratory and other test data, was performed as part of comprehensive evaluation for care management services.  Care Plan : RN Care Manager Plan of Care  Updates made by Valente David, RN since 01/19/2022 12:00 AM     Problem: Knowledge deficit related to Diabetes and care coordination needs   Priority: High     Long-Range Goal: Development plan of care for management of Diabetes   Start Date: 11/18/2021  Expected End Date: 06/18/2022  This Visit's Progress: On track   Recent Progress: On track  Priority: High  Note:   Current Barriers:  Knowledge Deficits related to plan of care for management of DMII   RNCM Clinical Goal(s):  Patient will verbalize basic understanding of  DMII disease process and self health management plan as evidenced by self report take all medications exactly as prescribed and will call provider for medication related questions as evidenced by chart reviewed and self report  through collaboration with RN Care manager, provider, and care team.   Interventions: Inter-disciplinary care team collaboration (see longitudinal plan of care) Evaluation of current treatment plan related to  self management and patient's adherence to plan as established by provider   Diabetes Interventions:  (Status:  Goal on track:  Yes.) Long Term Goal Assessed patient's understanding of A1c goal: <6.5% Provided education to patient about basic DM disease process Reviewed medications with patient and discussed importance of medication adherence Counseled on importance of regular laboratory monitoring as prescribed Discussed plans with patient for ongoing care management follow up and provided patient with direct contact information for care management team Provided patient with written educational materials related to hypo and hyperglycemia and importance of correct treatment Reviewed scheduled/upcoming provider appointments including: PCP May 12 and Foot provider 12/02/2021 Advised patient, providing education and rationale, to check cbg as prescribed and record, calling provider (Dr. Redmond Pulling) for findings outside established parameters Screening for signs and symptoms of depression related to chronic disease state  Assessed social determinant of health barriers Lab Results  Component Value Date   HGBA1C 8.5 (A) 11/28/2021  Update 12/18/2021: Spoke with pt who continues to manager her ongoing diabetes. Will soon add Libra device and continue to reduce her  carbohydrates to improve her ongoing CBG current ranging 130-145 with no acute symptoms. Will review the current plan of care and continue to encourage adherence. Verified pt received [facility] tools for ongoing daily monitoring and education has been provided today on dietary habits for improving her overall readings. Pt verbalized an understanding and continue to have a supportive spouse to assist as he also has this system and educates pt accordingly. No other issues to address as RN praise pt for her ongoing management of care related to her diabetes.  Patient Goals/Self-Care Activities: Take all medications as prescribed Attend all scheduled provider appointments Call pharmacy for medication refills 3-7 days in advance of running out of medications Attend church or other social activities Perform all self care activities independently  Perform IADL's (shopping, preparing meals, housekeeping, managing finances) independently Call provider office for new concerns or questions  keep appointment with eye doctor schedule appointment with eye doctor check blood sugar at prescribed times: twice daily check feet daily for cuts, sores or redness enter blood sugar readings and medication or insulin into daily log take the blood sugar log to all doctor visits set goal weight trim toenails straight across drink 6 to 8 glasses of water each day eat fish at least once per week fill half of plate with vegetables keep a food diary limit fast food meals to no more than 1 per week  manage portion size prepare main meal at home 3 to 5 days each week set a realistic goal switch to low-fat or skim milk switch to sugar-free drinks do heel pump exercise 2 to 3 times each day keep feet up while sitting wash and dry feet carefully every day wear comfortable, cotton socks wear comfortable, well-fitting shoes   Update 7/3 - Member and neighbor report member's blood sugars are becoming better controlled. She is  now on Oxempic weekly, Lantus has been discontinued.  Using the [facility] for monitoring, readings range 118-241 today, with the 241 being post meal.  She has started drinking water to bring it down, will monitor sugar and carb intake with dinner.    Follow Up Plan:  Telephone follow up appointment with care management team member scheduled for:  Within 3 months The patient has been provided with contact information for the care management team and has been advised to call with any health related questions or concerns.        Plan:  Telephone follow up appointment with care management team member scheduled for:  within 3 months The patient has been provided with contact information for the care management team and has been advised to call with any health related questions or concerns.   Valente David, RN, MSN, [place] Manager [phone]

## 2022-01-19 NOTE — Patient Outreach (Signed)
[facility]) Care Management  01/19/2022  Shardai Star [birth year] [id number]   Outgoing call placed to member, unsuccessful, HIPAA compliant voice message left.  Will provide follow up to assigned RNCM.  Valente David, RN, MSN, [place] Manager [phone]

## 2022-01-22 ENCOUNTER — Telehealth: Payer: Self-pay | Admitting: Podiatry

## 2022-01-22 NOTE — Telephone Encounter (Signed)
Pt called nurse line requesting all medications to be sent through the [place].   I did not see where a prescription was recently sent in, but I did want to notify you all just in case.  Please advise.

## 2022-02-10 NOTE — Telephone Encounter (Signed)
Pt came in office requesting an update on this. She would like for you all to do this as soon as possible.   Please advise.

## 2022-02-12 ENCOUNTER — Ambulatory Visit: Payer: Medicare Other | Attending: Family Medicine | Admitting: Pharmacist

## 2022-02-12 ENCOUNTER — Encounter: Payer: Self-pay | Admitting: Pharmacist

## 2022-02-12 DIAGNOSIS — Z794 Long term (current) use of insulin: Secondary | ICD-10-CM | POA: Insufficient documentation

## 2022-02-12 DIAGNOSIS — Z7985 Long-term (current) use of injectable non-insulin antidiabetic drugs: Secondary | ICD-10-CM | POA: Insufficient documentation

## 2022-02-12 DIAGNOSIS — G4733 Obstructive sleep apnea (adult) (pediatric): Secondary | ICD-10-CM | POA: Diagnosis not present

## 2022-02-12 DIAGNOSIS — E785 Hyperlipidemia, unspecified: Secondary | ICD-10-CM | POA: Diagnosis not present

## 2022-02-12 DIAGNOSIS — Z87891 Personal history of nicotine dependence: Secondary | ICD-10-CM | POA: Insufficient documentation

## 2022-02-12 DIAGNOSIS — Z853 Personal history of malignant neoplasm of breast: Secondary | ICD-10-CM | POA: Insufficient documentation

## 2022-02-12 DIAGNOSIS — Z79899 Other long term (current) drug therapy: Secondary | ICD-10-CM | POA: Diagnosis not present

## 2022-02-12 DIAGNOSIS — I10 Essential (primary) hypertension: Secondary | ICD-10-CM | POA: Diagnosis not present

## 2022-02-12 DIAGNOSIS — E1165 Type 2 diabetes mellitus with hyperglycemia: Secondary | ICD-10-CM | POA: Diagnosis not present

## 2022-02-12 DIAGNOSIS — E1140 Type 2 diabetes mellitus with diabetic neuropathy, unspecified: Secondary | ICD-10-CM | POA: Insufficient documentation

## 2022-02-12 DIAGNOSIS — Z8249 Family history of ischemic heart disease and other diseases of the circulatory system: Secondary | ICD-10-CM | POA: Insufficient documentation

## 2022-02-12 NOTE — Progress Notes (Signed)
S:    Emily Gray is a 76 y.o. female who presents for diabetes evaluation, education, and management. PMH is significant for HTN, T2DM, OSA, osteoarthritis, breast cancer. Patient was referred and last seen by Primary Care Provider, Dr. Redmond Pulling, on 11/28/2021; A1c had increased from 7.4 to 8.5. Last clinic visit was with pharmacy on 01/05/2022. At that visit, Novolog was discontinued (patient was not taking) and Ozempic 0.'25mg'$  weekly was started. Patient was not able to report any home BG readings at that visit but brought in [place] for placement.   Today, patient arrives in good spirits and presents without any assistance. When asked how she was tolerating Ozempic, patient states "I LOVE it, I want to do it more than once a week!" She reports decreased appetite and denies any GI effects. She takes Ozempic on Fridays at 8am. Patient complains that her feet "feel like sandpaper and sometimes go to sleep." She is on gabapentin 3x daily for neuropathy but reports breakthrough pain. She says she sometimes uses "a foot numbing cream with Shaq in the commercials" for relief before bed. Patient reported that her high BG reading in the 300's was a result of accidentally adding real sugar to her lemonade. She is amenable to getting a lipid panel today. She denies having any intolerance to statins and reports that atorvastatin '20mg'$  is the only dosage he has ever tried.     Patient reports diabetes was diagnosed in the last couple of years after a hospital admission.    Family/Social History: former smoker; family hx of HTN  Current diabetes medications include: Lantus 44 units daily, Ozempic 0.'25mg'$  weekly  Current hypertension medications include: lisinopril-HCTZ 20-12.'5mg'$  two tablets daily  Current hyperlipidemia medications include: atorvastatin '20mg'$  daily   Patient reports adherence to taking all medications as prescribed. She uses a pillbox and says she has not missed any doses in the last week.   Do  you feel that your medications are working for you? yes Have you been experiencing any side effects to the medications prescribed? no Insurance coverage: Medicare  Patient denies hypoglycemic events. Her lowest BG reading was in 90's.   Reported home fasting blood sugars: 163, 174, 124, 139, 172, 148 Reported 2 hour post-meal/random blood sugars: mostly mid-100's - 200's, some lower 100's-120's. One reading of 98 and one reading of 313.   Patient denies nocturia (nighttime urination).  Patient reports neuropathy (nerve pain). She reports experiencing in her feet, especially on days that she walks a lot.  Patient denies visual changes. Had eye doctor appointment recently.  Patient reports self foot exams.   Patient reported dietary habits: Patient reports being off of bread completely, except when she eats sandwiches with white-wheat bread at lunch. She says she can only eat half of the sandwich since starting Ozempic. Also says she eats a lot of fish, baked chicken (rarely fried chicken), beans, tomatoes, and rice. She endorses looking foods up in an app to check if they are okay for her to eat. She reports using small plastic bowls instead of a plate in order to control portion sizes. She tries to avoid snacking at night due to heartburn.   Patient-reported exercise habits: Patient reports being active completing housework, and says she walks around [employer] up to 10x every other night. She has ordered a walking/pedaling machine that is expected to be delivered on Friday, and patient is excited to start using it for exercise.    O:   Lab Results  Component  Value Date   HGBA1C 8.5 (A) 11/28/2021   There were no vitals filed for this visit.  Lipid Panel     Component Value Date/Time   CHOL 133 08/09/2019 1601   TRIG 69 08/09/2019 1601   HDL 36 (L) 08/09/2019 1601   CHOLHDL 3.7 08/09/2019 1601   LDLCALC 83 08/09/2019 1601   LDLDIRECT 85 08/08/2020 1419    Clinical  Atherosclerotic Cardiovascular Disease (ASCVD): No  The 10-year ASCVD risk score (Arnett DK, et al., 2019) is: 22.3%   Values used to calculate the score:     Age: 59 years     Sex: Female     Is Non-Hispanic African American: Yes     Diabetic: Yes     Tobacco smoker: No     Systolic Blood Pressure: 914 mmHg     Is BP treated: Yes     HDL Cholesterol: 36 mg/dL     Total Cholesterol: 133 mg/dL    A/P: Diabetes longstanding currently uncontrolled. Goals include A1c <7, FBG 80-130, postprandial glucose <180.  Patient is able to verbalize appropriate hypoglycemia management plan. Medication adherence appears good. Control is suboptimal likely due to dietary indiscretion, but patient is attempting to improve. -Continued Lantus (insulin glargine) 44 units daily.  -Increased dose of Ozempic (semaglutide) to 0.'5mg'$  weekly. Instructed patient to try for 4 weeks and let us know if she experiences any severe GI effects. Discussed increasing to '1mg'$  at next visit with PCP in 1 month.  -Patient educated on purpose, proper use, and potential adverse effects of Ozempic.  -Extensively discussed pathophysiology of diabetes, recommended lifestyle interventions, dietary effects on blood sugar control.  -Counseled on s/sx of and management of hypoglycemia.  -Next A1c anticipated 02/2022.   ASCVD risk - primary prevention in patient with diabetes. Last LDL was 85 on 08/08/20; not at goal of <70 mg/dL. ASCVD risk factors include HTN, DM, and 10-year ASCVD risk score of 22.3%. High intensity statin indicated. Patient has been on atorvastatin '20mg'$  since 2020. -Repeat lipid panel -Consider increasing to atorvastatin '40mg'$  based on results of lipid panel.   Hypertension longstanding currently close to goal with last BP 138/83 in May. Blood pressure goal of <130/80 mmHg. Medication adherence good.  -Continue lisinopril-HCTZ 20-12.5 two tablets daily  Written patient instructions provided. Patient verbalized  understanding of treatment plan.  Total time in face to face counseling 30 minutes.    Follow-up:  PCP clinic visit in 03/30/22 Pharmacist 04/16/22.  Patient seen with  Inis Sizer, PharmD Candidate [employer] 20[employer]  Benard Halsted, PharmD, [place], [place] [phone]

## 2022-02-12 NOTE — Telephone Encounter (Signed)
No idea what she wants

## 2022-02-13 LAB — LIPID PANEL
Chol/HDL Ratio: 3.8 ratio (ref 0.0–4.4)
Cholesterol, Total: 136 mg/dL (ref 100–199)
HDL: 36 mg/dL — ABNORMAL LOW (ref >39)
LDL Chol Calc (NIH): 86 mg/dL (ref 0–99)
Triglycerides: 70 mg/dL (ref 0–149)
VLDL Cholesterol Cal: 14 mg/dL (ref 5–40)

## 2022-02-16 NOTE — Telephone Encounter (Signed)
We write the initial prescription and she should now get thru [facility]

## 2022-02-17 NOTE — Telephone Encounter (Signed)
Patient has been giving information per Dr Paulla Dolly thru voice message.

## 2022-03-11 ENCOUNTER — Telehealth: Payer: Self-pay | Admitting: *Deleted

## 2022-03-11 NOTE — Telephone Encounter (Signed)
Patient is requesting a medication refill of gabapentin- 300 mg , please advise, send to [facility].[place] , [place]

## 2022-03-12 ENCOUNTER — Other Ambulatory Visit: Payer: Self-pay | Admitting: Podiatry

## 2022-03-12 MED ORDER — GABAPENTIN 300 MG PO CAPS: 300.0000 mg | ORAL_CAPSULE | Freq: Three times a day (TID) | ORAL | 3 refills | Status: DC

## 2022-03-12 NOTE — Telephone Encounter (Signed)
Sent in

## 2022-03-12 NOTE — Telephone Encounter (Signed)
Patient notified

## 2022-03-18 ENCOUNTER — Telehealth: Payer: Self-pay | Admitting: Family Medicine

## 2022-03-18 NOTE — Telephone Encounter (Signed)
Requested medication (s) are due for refill today:   Yes  Requested medication (s) are on the active medication list:   Yes  Future visit scheduled:   Yes in 1 wk   Last ordered: 01/05/2022 3 ml, 1 refill  Returned because creatinine due per protocol.   Requested Prescriptions  Pending Prescriptions Disp Refills   Semaglutide,0.25 or 0.'5MG'$ /DOS, 2 MG/3ML SOPN 3 mL 1    Sig: Inject 0.25 mg into the skin once a week. If tolerable after 4 weeks, increase to 0.5 mg once weekly.     Endocrinology:  Diabetes - GLP-1 Receptor Agonists - semaglutide Failed - 03/18/2022 10:44 AM      Failed - HBA1C in normal range and within 180 days    Hemoglobin A1C  Date Value Ref Range Status  11/28/2021 8.5 (A) 4.0 - 5.6 % Final   Hgb A1c MFr Bld  Date Value Ref Range Status  05/08/2020 9.2 (H) 4.8 - 5.6 % Final    Comment:             Prediabetes: 5.7 - 6.4          Diabetes: >6.4          Glycemic control for adults with diabetes: <7.0          Failed - Cr in normal range and within 360 days    Creatinine, Ser  Date Value Ref Range Status  11/15/2020 0.83 0.57 - 1.00 mg/dL Final         Passed - Valid encounter within last 6 months    Recent Outpatient Visits           1 month ago Type 2 diabetes mellitus with hyperglycemia, with long-term current use of insulin ([place])   [place], Annie Main L, RPH-CPP   2 months ago Type 2 diabetes mellitus with hyperglycemia, with long-term current use of insulin [facility])   [place], Annie Main L, RPH-CPP   3 months ago Type 2 diabetes mellitus with hyperglycemia, with long-term current use of insulin [facility])   [facility] at [facility], MD   6 months ago Type 2 diabetes mellitus with hyperglycemia, with long-term current use of insulin [facility])   Primary [facility] at [facility], MD   9 months ago Type 2 diabetes mellitus with hyperglycemia,  with long-term current use of insulin [facility])   Primary Care at [facility], MD       Future Appointments             In 1 week Dorna Mai, MD Primary Care at [facility]   In 4 weeks Daisy Blossom, Jarome Matin, [place]

## 2022-03-18 NOTE — Telephone Encounter (Signed)
Medication Refill - Medication: Semaglutide,0.25 or 0.'5MG'$ /DOS, 2 MG/3ML SOPN  Has the patient contacted their pharmacy? Yes.     Preferred Pharmacy (with phone number or street name):  [place], [place] Phone:  [phone]  Fax:  [phone]     Has the patient been seen for an appointment in the last year OR does the patient have an upcoming appointment? Yes.

## 2022-03-19 MED ORDER — SEMAGLUTIDE(0.25 OR 0.5MG/DOS) 2 MG/3ML ~~LOC~~ SOPN: 0.2500 mg | PEN_INJECTOR | SUBCUTANEOUS | 1 refills | Status: DC

## 2022-03-25 NOTE — Telephone Encounter (Addendum)
Patient called in is all out of this med and  hasn't received medication in mail yet from,  [place], [place] Phone:  [phone]  Fax:  [phone]    and she is all out. Please call back ot discuss

## 2022-03-27 ENCOUNTER — Other Ambulatory Visit: Payer: Self-pay | Admitting: Pharmacist

## 2022-03-27 ENCOUNTER — Telehealth: Payer: Self-pay | Admitting: Pharmacist

## 2022-03-27 MED ORDER — SEMAGLUTIDE(0.25 OR 0.5MG/DOS) 2 MG/3ML ~~LOC~~ SOPN: 0.5000 mg | PEN_INJECTOR | SUBCUTANEOUS | 2 refills | Status: DC

## 2022-03-27 NOTE — Telephone Encounter (Signed)
Rx sent. Called her [place]. Confirmed that they received this and will deliver in ~14 business days. She see's her PCP Monday. She may need to be placed back on a low dose, daily insulin to hold her over until Ozempic comes in the mail. She will discuss this with Dr. Redmond Pulling Monday.

## 2022-03-27 NOTE — Telephone Encounter (Signed)
[place] friend,   Are we able to start a PA for this patient's Ozempic?

## 2022-03-27 NOTE — Telephone Encounter (Signed)
Patient called and given msg per [facility]. Patient verbalize understanding

## 2022-03-27 NOTE — Telephone Encounter (Signed)
Pt called to speak with Lurena Joiner about not receiving her insulin from mail order pharmacy yet / pt is due for her shot today / please advise asap

## 2022-03-30 ENCOUNTER — Encounter: Payer: Self-pay | Admitting: Family Medicine

## 2022-03-30 ENCOUNTER — Ambulatory Visit (INDEPENDENT_AMBULATORY_CARE_PROVIDER_SITE_OTHER): Payer: Medicare Other | Admitting: Family Medicine

## 2022-03-30 VITALS — BP 137/84 | HR 77 | Temp 97.7°F | Resp 16 | Wt 242.2 lb

## 2022-03-30 DIAGNOSIS — E782 Mixed hyperlipidemia: Secondary | ICD-10-CM

## 2022-03-30 DIAGNOSIS — Z794 Long term (current) use of insulin: Secondary | ICD-10-CM | POA: Diagnosis not present

## 2022-03-30 DIAGNOSIS — I10 Essential (primary) hypertension: Secondary | ICD-10-CM

## 2022-03-30 DIAGNOSIS — E1165 Type 2 diabetes mellitus with hyperglycemia: Secondary | ICD-10-CM | POA: Diagnosis not present

## 2022-03-30 MED ORDER — LISINOPRIL-HYDROCHLOROTHIAZIDE 20-12.5 MG PO TABS: 2.0000 | ORAL_TABLET | Freq: Every day | ORAL | 1 refills | Status: DC

## 2022-03-30 MED ORDER — ATORVASTATIN CALCIUM 20 MG PO TABS: 20.0000 mg | ORAL_TABLET | Freq: Every day | ORAL | 1 refills | Status: DC
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
## 2022-03-31 LAB — BASIC METABOLIC PANEL WITH GFR
BUN/Creatinine Ratio: 28 (ref 12–28)
BUN: 22 mg/dL (ref 8–27)
CO2: 25 mmol/L (ref 20–29)
Calcium: 9.3 mg/dL (ref 8.7–10.3)
Chloride: 102 mmol/L (ref 96–106)
Creatinine, Ser: 0.8 mg/dL (ref 0.57–1.00)
Glucose: 136 mg/dL — ABNORMAL HIGH (ref 70–99)
Potassium: 4.3 mmol/L (ref 3.5–5.2)
Sodium: 143 mmol/L (ref 134–144)
eGFR: 77 mL/min/{1.73_m2}

## 2022-03-31 LAB — LIPID PANEL
Chol/HDL Ratio: 3.4 ratio (ref 0.0–4.4)
Cholesterol, Total: 133 mg/dL (ref 100–199)
HDL: 39 mg/dL — ABNORMAL LOW (ref >39)
LDL Chol Calc (NIH): 82 mg/dL (ref 0–99)
Triglycerides: 52 mg/dL (ref 0–149)
VLDL Cholesterol Cal: 12 mg/dL (ref 5–40)

## 2022-03-31 LAB — AST: AST: 16 IU/L (ref 0–40)

## 2022-03-31 LAB — ALT: ALT: 12 IU/L (ref 0–32)

## 2022-03-31 LAB — HEMOGLOBIN A1C
Est. average glucose Bld gHb Est-mCnc: 163 mg/dL
Hgb A1c MFr Bld: 7.3 % — ABNORMAL HIGH (ref 4.8–5.6)

## 2022-04-16 ENCOUNTER — Other Ambulatory Visit: Payer: Self-pay | Admitting: Pharmacist

## 2022-04-16 ENCOUNTER — Ambulatory Visit: Attending: Family Medicine | Admitting: Pharmacist

## 2022-04-16 DIAGNOSIS — E1165 Type 2 diabetes mellitus with hyperglycemia: Secondary | ICD-10-CM

## 2022-04-16 DIAGNOSIS — Z794 Long term (current) use of insulin: Secondary | ICD-10-CM

## 2022-04-16 MED ORDER — SEMAGLUTIDE (1 MG/DOSE) 4 MG/3ML ~~LOC~~ SOPN: 1.0000 mg | PEN_INJECTOR | SUBCUTANEOUS | 2 refills | Status: DC

## 2022-04-16 NOTE — Progress Notes (Signed)
S:     No chief complaint on file.  Emily Gray is a 76 y.o. female who presents for diabetes evaluation, education, and management.  PMH is significant for HTN, T2DM, OA.  Patient was referred and last seen by Primary Care Provider, Dr. Redmond Pulling, on 11/28/2021.   At last visit with PCP (03/30/2022), A1C was 7.3% down from 8.5%.  Last visit with pharmacist (02/12/2022), Ozempic was increased to 0.5 mg weekly.   Today, patient arrives in good spirits and presents without any assistance. Patient is not taking insulin Semglee since she started taking Ozempic.    Patient reports Diabetes is longstanding.   Family/Social History:  -Fhx: HTN -Tobacco: former smoker  Current diabetes medications include: Ozempic 0.5 mg once weekly  Patient reports adherence to taking all medications as prescribed.   Do you feel that your medications are working for you? yes Have you been experiencing any side effects to the medications prescribed? no Do you have any problems obtaining medications due to transportation or finances? no Insurance coverage: [employer]  Patient denies hypoglycemic events. One reading of 68 without hypoglycemic symptoms.  Reported home fasting blood sugars: 150s  Reported 2 hour post-meal/random blood sugars: 200s, patient reports checking right after eating a meal.   Patient denies nocturia (nighttime urination).  Patient reports neuropathy (nerve pain). Patient denies visual changes. Patient reports self foot exams.   Patient reported dietary habits: -Patient cuts off bread completely, eats a lot of veggies. -Drinks zero sodas every once in a while  Patient-reported exercise habits:  -Patient reports walking 15-30 minutes daily.   O:  7 day average blood glucose: 162 14 day average blood glucose: 173 30 day average blood glucose: 157  Lab Results  Component Value Date   HGBA1C 7.3 (H) 03/30/2022   There were no vitals filed for this visit.  Lipid Panel      Component Value Date/Time   CHOL 133 03/30/2022 1046   TRIG 52 03/30/2022 1046   HDL 39 (L) 03/30/2022 1046   CHOLHDL 3.4 03/30/2022 1046   LDLCALC 82 03/30/2022 1046   LDLDIRECT 85 08/08/2020 1419   Clinical Atherosclerotic Cardiovascular Disease (ASCVD): No  The 10-year ASCVD risk score (Arnett DK, et al., 2019) is: 22.6%   Values used to calculate the score:     Age: 63 years     Sex: Female     Is Non-Hispanic African American: Yes     Diabetic: Yes     Tobacco smoker: No     Systolic Blood Pressure: 710 mmHg     Is BP treated: Yes     HDL Cholesterol: 39 mg/dL     Total Cholesterol: 133 mg/dL   A/P: Diabetes longstanding currently above goal but improving (A1C 7.3%). Patient is able to verbalize appropriate hypoglycemia management plan. Medication adherence appears appropriate.  -Discontinued basal insulin Semglee. -Increased dose of GLP-1 Ozempic to 1 mg once weekly. -Patient educated on purpose, proper use, and potential adverse effects of Ozempic.  -Extensively discussed pathophysiology of diabetes, recommended lifestyle interventions, dietary effects on blood sugar control.  -Counseled on s/sx of and management of hypoglycemia.  -Next A1c anticipated 06/2022.   Written patient instructions provided. Patient verbalized understanding of treatment plan.  Total time in face to face counseling 30 minutes.    Follow-up:  -Follow up with Pharmacist in a month and half.   Patient seen with: Deirdre Evener, PharmD Candidate [employer]  Class of 2025    Pharmacist:  Benard Halsted, PharmD, [facility] [place], [place] [phone]

## 2022-04-27 ENCOUNTER — Ambulatory Visit: Payer: Self-pay | Admitting: *Deleted

## 2022-04-27 NOTE — Telephone Encounter (Signed)
  Chief Complaint: difficulty applying Freestyle Libre 2 sensor Symptoms: patient  has Beverely Low that has been assisting with placing freestyle Libre 2 sensor for a "while'. No issues inserting to patient's arm or husband arm until 2 weeks ago. Ms. Keenan Bachelor reports 2 weeks ago attempted to apply to left arm and noted blood dripping from under sensor, removed sensor and bleeding stopped and bruising noted. Patient checking blood glucose with lancets and strips. This week Ms. Keenan Bachelor attempted to apply sensor to right arm and noted blood dripping beneath the sensor and removed and bleeding stopped no bruising noted. Blood glucose today this am was 150 and then 125. Requesting assistance.  Frequency: 2 weeks ago last 3 batches of sensors applied has not worked.  Pertinent Negatives: Patient denies hyper or hypoglycemia Disposition: '[]'$ ED /'[]'$ Urgent Care (no appt availability in office) / '[]'$ Appointment(In office/virtual)/ '[]'$  [place] [facility]/ '[]'$ Home Care/ '[]'$ Refused Recommended Disposition /'[]'$ [place] [facility]/ '[x]'$  Follow-up with PCP Additional Notes:   Recommended Ms. Keenan Bachelor to assist patient to call mail order pharmacy to notify of issues with sensors. Please advise. Patient would like a call back.    Reason for Disposition  [1] Caller requesting NON-URGENT health information AND [2] PCP's office is the best resource  Answer Assessment - Initial Assessment Questions 1. REASON FOR CALL or QUESTION: "What is your reason for calling today?" or "How can I best help you?" or "What question do you have that I can help answer?"     When applying  freestyle Libre 2 sensor having difficulty with arm bleeding. When sensor removed arm stops bleeding.  Protocols used: Information Only Call - No Triage-A-AH

## 2022-04-28 NOTE — Telephone Encounter (Signed)
Patient was called and she did get in touch with freestyle libra customer service center. They will be sending patient another device with hopes this one will  work out fore patient. If not patient said she will not be using anymore.

## 2022-05-01 ENCOUNTER — Ambulatory Visit: Payer: Self-pay | Admitting: *Deleted

## 2022-05-01 NOTE — Telephone Encounter (Signed)
  Chief Complaint: Both legs and ankles swollen and tight for 3 weeks. Symptoms: painful when she puts lotion on them Frequency: 3 weeks now Pertinent Negatives: Patient denies Shortness of breath or chest tightness Disposition: '[]'$ ED /'[]'$ Urgent Care (no appt availability in office) / '[x]'$ Appointment(In office/virtual)/ '[]'$  [place] [facility]/ '[]'$ Home Care/ '[]'$ Refused Recommended Disposition /'[]'$ [place] [facility]/ '[]'$  Follow-up with PCP Additional Notes: Appt. Made with Dr. Redmond Pulling for 05/18/2022 at 9:40.   Instructed to go to the ED if shortness of breath or chest tightness occurs.

## 2022-05-01 NOTE — Telephone Encounter (Signed)
Message from Oneta Rack sent at 05/01/2022  3:33 PM EDT  Summary: Both legs and ankles are swollen and tight   Both legs and ankles are swollen and tight  for 3 weeks. Patient sees a foot doctor and was unable to connect with specialist. Patient seeking clinical advice and appointment.           Call History   Type Contact Phone/Fax User  05/01/2022 03:31 PM EDT Phone (Incoming) Emily Gray, Emily Gray (Self) [phone] Jerilynn Mages) Oneta Rack   Reason for Disposition  [1] MODERATE leg swelling (e.g., swelling extends up to knees) AND [2] new-onset or worsening  Answer Assessment - Initial Assessment Questions 1. ONSET: "When did the swelling start?" (e.g., minutes, hours, days)     Having swelling in both legs and ankles for 3 weeks.  They are tight. I've had this before.    2. LOCATION: "What part of the leg is swollen?"  "Are both legs swollen or just one leg?"     From ankles to my calves both legs.    Putting lotion hurts. 3. SEVERITY: "How bad is the swelling?" (e.g., localized; mild, moderate, severe)   - Localized: Small area of swelling localized to one leg.   - MILD pedal edema: Swelling limited to foot and ankle, pitting edema < 1/4 inch (6 mm) deep, rest and elevation eliminate most or all swelling.   - MODERATE edema: Swelling of lower leg to knee, pitting edema > 1/4 inch (6 mm) deep, rest and elevation only partially reduce swelling.   - SEVERE edema: Swelling extends above knee, facial or hand swelling present.      Moderate. I had fluid pills in the past.   4. REDNESS: "Does the swelling look red or infected?"     No 5. PAIN: "Is the swelling painful to touch?" If Yes, ask: "How painful is it?"   (Scale 1-10; mild, moderate or severe)     Tender to touch 6. FEVER: "Do you have a fever?" If Yes, ask: "What is it, how was it measured, and when did it start?"      No 7. CAUSE: "What do you think is causing the leg swelling?"     My legs feel tight 8. MEDICAL HISTORY: "Do  you have a history of blood clots (e.g., DVT), cancer, heart failure, kidney disease, or liver failure?"     No 9. RECURRENT SYMPTOM: "Have you had leg swelling before?" If Yes, ask: "When was the last time?" "What happened that time?"     Yes 10. OTHER SYMPTOMS: "Do you have any other symptoms?" (e.g., chest pain, difficulty breathing)       No shortness of breath or chest tightness 11. PREGNANCY: "Is there any chance you are pregnant?" "When was your last menstrual period?"       N/A  Protocols used: Leg Swelling and Edema-A-AH

## 2022-05-14 ENCOUNTER — Encounter: Payer: Self-pay | Admitting: Podiatry

## 2022-05-14 ENCOUNTER — Ambulatory Visit (INDEPENDENT_AMBULATORY_CARE_PROVIDER_SITE_OTHER): Payer: Medicare Other | Admitting: Podiatry

## 2022-05-14 DIAGNOSIS — E1140 Type 2 diabetes mellitus with diabetic neuropathy, unspecified: Secondary | ICD-10-CM

## 2022-05-14 DIAGNOSIS — M79674 Pain in right toe(s): Secondary | ICD-10-CM

## 2022-05-14 DIAGNOSIS — B351 Tinea unguium: Secondary | ICD-10-CM

## 2022-05-14 DIAGNOSIS — M79675 Pain in left toe(s): Secondary | ICD-10-CM | POA: Diagnosis not present

## 2022-05-14 DIAGNOSIS — E1149 Type 2 diabetes mellitus with other diabetic neurological complication: Secondary | ICD-10-CM

## 2022-05-15 NOTE — Progress Notes (Signed)
Subjective:   Patient ID: Emily Gray, female   DOB: 76 y.o.   MRN: [id number]   HPI Patient presents stating that she has nails that are thick and 1-5 on both feet and that she is still getting the nerve pain and the tingling pain and the gabapentin does not seem to help.  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

## 2022-05-18 ENCOUNTER — Ambulatory Visit (INDEPENDENT_AMBULATORY_CARE_PROVIDER_SITE_OTHER): Payer: Medicare Other | Admitting: Family Medicine

## 2022-05-18 VITALS — BP 137/83 | HR 87 | Temp 98.1°F | Resp 16 | Wt 238.6 lb

## 2022-05-18 DIAGNOSIS — M25551 Pain in right hip: Secondary | ICD-10-CM | POA: Diagnosis not present

## 2022-05-18 MED ORDER — SEMAGLUTIDE (1 MG/DOSE) 4 MG/3ML ~~LOC~~ SOPN: 1.0000 mg | PEN_INJECTOR | SUBCUTANEOUS | 2 refills | Status: DC

## 2022-05-18 NOTE — Progress Notes (Unsigned)
Patient said here legs has been swollen 3 wks. Patient also said that the side of right hip has pain 5/10. Patient takes OTC Advil for pain. Patient also has left knee pain.

## 2022-05-19 ENCOUNTER — Encounter: Payer: Self-pay | Admitting: Family Medicine
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
## 2022-05-20 ENCOUNTER — Other Ambulatory Visit: Payer: Self-pay | Admitting: Family Medicine

## 2022-05-20 NOTE — Telephone Encounter (Signed)
Medication Refill - Medication: Continuous Blood Gluc Sensor (FREESTYLE LIBRE 2 SENSOR) MISC  Has the patient contacted their pharmacy? Yes.    (Agent: If yes, when and what did the pharmacy advise?) Contact PCP office   Preferred Pharmacy (with phone number or street name):   [facility], [place] Phone:  [phone]  Fax:  [phone]      Has the patient been seen for an appointment in the last year OR does the patient have an upcoming appointment? Yes.    Agent: Please be advised that RX refills may take up to 3 business days. We ask that you follow-up with your pharmacy.

## 2022-05-21 MED ORDER — FREESTYLE LIBRE 2 SENSOR MISC: 1 refills | Status: DC

## 2022-05-21 NOTE — Telephone Encounter (Signed)
Requested Prescriptions  Pending Prescriptions Disp Refills   Continuous Blood Gluc Sensor (FREESTYLE LIBRE 2 SENSOR) MISC 6 each 1    Sig: Utilize as directed q2 weeks to monitor blood glucose - E11.9     Endocrinology: Diabetes - Testing Supplies Passed - 05/20/2022  4:21 PM      Passed - Valid encounter within last 12 months    Recent Outpatient Visits           3 days ago Right hip pain   Primary Care at [facility], Clyde Canterbury, MD   1 month ago Type 2 diabetes mellitus with hyperglycemia, with long-term current use of insulin [facility])   [place], Annie Main L, RPH-CPP   1 month ago Type 2 diabetes mellitus with hyperglycemia, with long-term current use of insulin [facility])   Primary Care at [facility], MD   3 months ago Type 2 diabetes mellitus with hyperglycemia, with long-term current use of insulin [facility])   [place], Annie Main L, RPH-CPP   4 months ago Type 2 diabetes mellitus with hyperglycemia, with long-term current use of insulin [facility])   [place], Jarome Matin, RPH-CPP       Future Appointments             In 3 weeks Daisy Blossom, Jarome Matin, [place]   In 1 month Dorna Mai, MD Primary Care at [facility]

## 2022-05-26 NOTE — Therapy (Signed)
OUTPATIENT PHYSICAL THERAPY EVALUATION   Patient Name: Emily Gray MRN: [id number] DOB:November 07, 1945, 76 y.o., female Today's Date: 05/27/2022   PT End of Session - 05/27/22 0931     Visit Number 1    Number of Visits 9    Date for PT Re-Evaluation 07/22/22    Authorization Type MCR A&B    Progress Note Due on Visit 10    PT Start Time 0915    PT Stop Time 1000    PT Time Calculation (min) 45 min    Activity Tolerance Patient tolerated treatment well    Behavior During Therapy WFL for tasks assessed/performed             Past Medical History:  Diagnosis Date   Acid reflux    Arthritis    Breast cancer ([place])    Hypertension    Obesity, unspecified    Pure hyperglyceridemia    Past Surgical History:  Procedure Laterality Date   APPENDECTOMY     BILATERAL SALPINGOOPHORECTOMY     BREAST LUMPECTOMY Right    CATARACT EXTRACTION Left    EYE SURGERY Left 07/20/2008   macro-hole, filled with gel substance at [facility]  12/03/2004   TONSILLECTOMY     TUBAL LIGATION Bilateral    Patient Active Problem List   Diagnosis Date Noted   Encounter for other specified special examinations 11/28/2021   Other specified counseling 11/28/2021   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   DM (diabetes mellitus), type 2 ([place]) 10/20/2018   Obesity 10/20/2018   OSA (obstructive sleep apnea) 10/18/2018   Hypertension    Breast cancer ([place])    Varicose veins of lower extremities with other complications 61/44/3154    PCP: Dorna Mai, MD  REFERRING PROVIDER: Dorna Mai, MD  REFERRING DIAG: Right hip pain  THERAPY DIAG:  Pain in right hip  Muscle weakness (generalized)  Other abnormalities of gait and mobility  Rationale for Evaluation and Treatment: Rehabilitation  ONSET DATE: Chronic   SUBJECTIVE: SUBJECTIVE STATEMENT: Patient reports right hip pain that has been going on for a while, maybe around 8 months. She states she  went to sit in a car and she flopped in the car. She states that doing housework, getting in and out of the bed, sitting down and getting up from a low chair. She did have a steroid injection but this did not help. She has been using a cane for over 6 months. 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 She enjoys working in the garden and has to use a chair to help.  PERTINENT HISTORY: History of breast cancer, DM II, HTN, OA bilateral knees.  PAIN:  Are you having pain? Yes:  NPRS scale: 0/10 (10/10 pain at worst) Pain location: Right hip, anterior Pain description: Sharp, throbbing Aggravating factors: Housework, getting in and out of the bed, sitting down and getting up from a low chair Relieving factors: OTC medication  PRECAUTIONS: None  WEIGHT BEARING RESTRICTIONS: No  FALLS:  Has patient fallen in last 6 months? 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  MUSCLE LENGTH: Not assessed  POSTURE:  Rounded shoulders  PALPATION: Tender to palpation right anterior hip  LOWER EXTREMITY ROM:   Patient guarded with right hip PROM secondary to pain  LOWER EXTREMITY MMT:  MMT Right eval Left eval  Hip flexion 3 4  Hip extension 2 3  Hip abduction 2 3  Knee flexion 5 5  Knee extension 5 5   LOWER EXTREMITY SPECIAL TESTS:  Not assessed  FUNCTIONAL TESTS:  5 times sit to stand: 26 seconds 2 minute walk  test: 180  GAIT: Distance walked: 180 ft Assistive device utilized: Single point cane - used on right side Level of assistance: Modified independence Comments: decreased gait speed, trendelenburg on right; patient using SPC on right side that is too tall   TODAY'S TREATMENT: [facility] Adult PT Treatment:                                                DATE: 05/27/2022 Therapeutic Exercise: Sit to stand without UE support x 10 Seated clamshell with green x 15 Seated alternating march with green x 20  PATIENT EDUCATION:  Education details: Exam findings, POC, HEP Person educated: Patient Education method: Explanation, Demonstration, Tactile cues, Verbal cues, and Handouts Education comprehension: verbalized understanding, returned demonstration, verbal cues required, tactile cues required, and needs further education  HOME EXERCISE PROGRAM: Access Code: [email or web address]    ASSESSMENT: CLINICAL IMPRESSION: Patient is a 76 y.o. female who was seen today for physical therapy evaluation and treatment for chronic right anterior hip pain. She reports pain mainly occurs when attempted to perform hip flexion based movements including lifting the right leg when transferring out of bed or standing from a lower chair. She does exhibit gross strength deficits of the right hip and unable to fully assess right hip mobility due to guarding. She also exhibits limitations in mobility and walking with use of SPC.    OBJECTIVE IMPAIRMENTS: Abnormal gait, decreased activity tolerance, decreased balance, decreased ROM, decreased strength, impaired flexibility, improper body mechanics, postural dysfunction, and pain.   ACTIVITY LIMITATIONS: bending, sitting, standing, squatting, stairs, bathing, dressing, and locomotion level  PARTICIPATION LIMITATIONS: meal prep, cleaning, laundry, shopping, and community activity  PERSONAL FACTORS: Fitness, Past/current experiences, and Time since onset of  injury/illness/exacerbation are also affecting patient's functional outcome.   REHAB POTENTIAL: Good  CLINICAL DECISION MAKING: Stable/uncomplicated  EVALUATION COMPLEXITY: Low   GOALS: Goals reviewed with patient? Yes  SHORT TERM GOALS: Target date: 06/24/2022   Patient will be I with initial HEP in order to progress with therapy. Baseline: HEP provided at eval Goal status: INITIAL  2.  PT will review FOTO with patient by 3rd visit in order to understand expected progress and outcome with therapy. Baseline: FOTO assessed at eval Goal status: INITIAL  LONG TERM GOALS: Target date: 07/22/2022   Patient will be I with final HEP to maintain progress from PT. Baseline: HEP provided at eval Goal status: INITIAL  2.  Patient will report >/= 46% status on FOTO to indicate improved functional ability. Baseline: 27% functional status Goal status: INITIAL  3.  Patient will demonstrate 5xSTS </= 15 seconds in order to indicate improved LE strength and mobility Baseline: 26 seconds  Goal status: INITIAL  4.  Patient will perform 2MWT >/= 300 ft using LRAD in order to indicate improved community access Baseline: 180 ft using SPC Goal status: INITIAL  5. Patient will report right hip pain with activity </= 5/10 in order to reduce functional limitations.  Baseline: 10/10 when pain occurs Goal status: INITIAL   PLAN: PT FREQUENCY: 1x/week  PT DURATION: 8 weeks  PLANNED INTERVENTIONS: Therapeutic exercises, Therapeutic activity, Neuromuscular re-education, Balance training, Gait training, Patient/Family education, Self Care, Joint mobilization, Joint manipulation, Aquatic Therapy, Dry Needling, Cryotherapy, Moist heat, Manual therapy, and Re-evaluation  PLAN FOR NEXT SESSION: Review HEP and progress PRN, focus primarily on right hip strengthening, progressing to standing exercises as able, gait training and adjusting SPC to fit properly and trial on left side   Hilda Blades, PT,  DPT, LAT, ATC 05/27/22  1:53 PM Phone: [phone] Fax: [phone]

## 2022-05-27 ENCOUNTER — Other Ambulatory Visit: Payer: Self-pay

## 2022-05-27 ENCOUNTER — Encounter: Payer: Self-pay | Admitting: Physical Therapy

## 2022-05-27 ENCOUNTER — Ambulatory Visit: Payer: Medicare Other | Attending: Family Medicine | Admitting: Physical Therapy

## 2022-05-27 DIAGNOSIS — M25551 Pain in right hip: Secondary | ICD-10-CM | POA: Insufficient documentation

## 2022-05-27 DIAGNOSIS — R2689 Other abnormalities of gait and mobility: Secondary | ICD-10-CM | POA: Insufficient documentation

## 2022-05-27 DIAGNOSIS — M6281 Muscle weakness (generalized): Secondary | ICD-10-CM | POA: Insufficient documentation

## 2022-05-27 NOTE — Patient Instructions (Signed)
Access Code: [email or web address] URL: [email or web address] Date: 05/27/2022 Prepared by: Hilda Blades  Exercises - Sit to Stand with Arms Crossed  - 3-5 x daily - 10 reps - Seated Hip Abduction with Resistance  - 2 x daily - 2 sets - 15 reps - Seated March with Resistance  - 2 x daily - 2 sets - 20 reps

## 2022-05-28 ENCOUNTER — Telehealth: Payer: Self-pay | Admitting: Family Medicine

## 2022-05-28 NOTE — Telephone Encounter (Signed)
Pt states she got a letter from [facility] concerning her Semaglutide, 1 MG/DOSE, 4 MG/3ML SOPN .  They advised this med can be used for weight loss.[facility] wants the dr to make sure the dx codes are Icd 9 250- Icd 10 E11-

## 2022-05-29 ENCOUNTER — Ambulatory Visit (INDEPENDENT_AMBULATORY_CARE_PROVIDER_SITE_OTHER): Payer: Medicare Other

## 2022-05-29 DIAGNOSIS — Z0000 Encounter for general adult medical examination without abnormal findings: Secondary | ICD-10-CM

## 2022-05-29 NOTE — Patient Instructions (Signed)

## 2022-05-29 NOTE — Progress Notes (Signed)
Subjective:   Emily Gray is a 76 y.o. female who presents for Medicare Annual (Subsequent) preventive examination. I connected with  Vassie Loll on 05/29/22 by a audio enabled telemedicine application and verified that I am speaking with the correct person using two identifiers.  Patient Location: Home  Provider Location: Home Office  I discussed the limitations of evaluation and management by telemedicine. The patient expressed understanding and agreed to proceed.     Objective:    There were no vitals filed for this visit. There is no height or weight on file to calculate BMI.     05/27/2022    1:35 PM 11/18/2021    1:46 PM 11/14/2021    2:43 PM 08/08/2020    1:42 PM 10/18/2018    1:11 AM 10/17/2018    8:47 PM  Advanced Directives  Does Patient Have a Medical Advance Directive? No No Yes;No No Yes No  Type of Production manager of [place];Living will   Does patient want to make changes to medical advance directive?  No - Patient declined No - Patient declined  No - Patient declined   Would patient like information on creating a medical advance directive? No - Patient declined     No - Patient declined    Current Medications (verified) Outpatient Encounter Medications as of 05/29/2022  Medication Sig   alendronate (FOSAMAX) 10 MG tablet Take by mouth.   aspirin 81 MG chewable tablet Chew by mouth.   aspirin 81 MG tablet Take 81 mg by mouth daily.   atorvastatin (LIPITOR) 20 MG tablet Take 1 tablet (20 mg total) by mouth daily.   Blood Glucose Monitoring Suppl (ONETOUCH VERIO REFLECT) w/Device KIT Use to check blood sugar TID. E11.65   cholecalciferol (VITAMIN D3) 25 MCG (1000 UT) tablet Take 1 tablet (1,000 Units total) by mouth daily.   COD LIVER OIL/VITAMINS A & D PO Take by mouth.   Continuous Blood Gluc Sensor (FREESTYLE LIBRE 2 SENSOR) MISC Utilize as directed q2 weeks to monitor blood glucose - E11.9   cyanocobalamin ([employer] VITAMIN B12) 1000 MCG  tablet Take by mouth.   furosemide (LASIX) 20 MG tablet Take by mouth.   glucose blood (ONETOUCH VERIO) test strip Use to check blood sugar TID. E11.65   ibuprofen (ADVIL) 600 MG tablet Take 1 tablet (600 mg total) by mouth every 8 (eight) hours as needed. Use sparingly   Insulin Pen Needle 32G X 4 MM MISC Use with insulin pen daily. Dx: E11.9   Lancets (ONETOUCH DELICA PLUS HENIDP82U) MISC Use to check blood sugar TID. E11.65   lisinopril-hydrochlorothiazide (ZESTORETIC) 20-12.5 MG tablet Take 2 tablets by mouth daily.   Semaglutide, 1 MG/DOSE, 4 MG/3ML SOPN Inject 1 mg as directed once a week.   VITAMIN D PO Take by mouth.   diclofenac Sodium (VOLTAREN) 1 % GEL Apply topically. (Patient not taking: Reported on 05/29/2022)   gabapentin (NEURONTIN) 300 MG capsule Take 1 capsule (300 mg total) by mouth 3 (three) times daily. (Patient not taking: Reported on 05/29/2022)   gabapentin (NEURONTIN) 300 MG capsule Take 1 capsule (300 mg total) by mouth 3 (three) times daily. (Patient not taking: Reported on 05/29/2022)   No facility-administered encounter medications on file as of 05/29/2022.    Allergies (verified) Patient has no known allergies.   History: Past Medical History:  Diagnosis Date   Acid reflux    Arthritis    Breast cancer ([employer])    Hypertension  Obesity, unspecified    Pure hyperglyceridemia    Past Surgical History:  Procedure Laterality Date   APPENDECTOMY     BILATERAL SALPINGOOPHORECTOMY     BREAST LUMPECTOMY Right    CATARACT EXTRACTION Left    EYE SURGERY Left 07/20/2008   macro-hole, filled with gel substance at [facility]  12/03/2004   TONSILLECTOMY     TUBAL LIGATION Bilateral    Family History  Problem Relation Age of Onset   Hypertension Mother    Cancer Father    Hypertension Father    Breast cancer Sister    Social History   Socioeconomic History   Marital status: Married    Spouse name: Not on file   Number of children:  Not on file   Years of education: Not on file   Highest education level: Not on file  Occupational History   Not on file  Tobacco Use   Smoking status: Former   Smokeless tobacco: Never  Vaping Use   Vaping Use: Never used  Substance and Sexual Activity   Alcohol use: No   Drug use: No   Sexual activity: Not on file  Other Topics Concern   Not on file  Social History Narrative   Not on file   Social Determinants of Health   Financial Resource Strain: Low Risk  (05/29/2022)   Overall Financial Resource Strain (CARDIA)    Difficulty of Paying Living Expenses: Not hard at all  Food Insecurity: No Food Insecurity (05/29/2022)   Hunger Vital Sign    Worried About Running Out of Food in the Last Year: Never true    [place] in the Last Year: Never true  Transportation Needs: No Transportation Needs (05/29/2022)   PRAPARE - Hydrologist (Medical): No    Lack of Transportation (Non-Medical): No  Physical Activity: Insufficiently Active (05/29/2022)   Exercise Vital Sign    Days of Exercise per Week: 1 day    Minutes of Exercise per Session: 20 min  Stress: Stress Concern Present (05/29/2022)   [place]    Feeling of Stress : To some extent  Social Connections: Moderately Isolated (05/29/2022)   Social Connection and Isolation Panel [NHANES]    Frequency of Communication with Friends and Family: More than three times a week    Frequency of Social Gatherings with Friends and Family: Once a week    Attends Religious Services: Never    Marine scientist or Organizations: No    Attends Music therapist: Never    Marital Status: Married    Tobacco Counseling Counseling given: Not Answered   Clinical Intake:  Pre-visit preparation completed: Yes  Pain : No/denies pain     Diabetes: Yes CBG done?: No Did pt. bring in CBG monitor from home?: No  How  often do you need to have someone help you when you read instructions, pamphlets, or other written materials from your doctor or pharmacy?: 4 - Often What is the last grade level you completed in school?: 12th grade  Diabetic?yes  Interpreter Needed?: No      Activities of Daily Living    05/29/2022   11:26 AM 11/18/2021    1:51 PM  In your present state of health, do you have any difficulty performing the following activities:  Hearing? 0 0  Vision? 0 0  Difficulty concentrating or making decisions? 0  0  Walking or climbing stairs? 1 0  Dressing or bathing? 0 0  Doing errands, shopping? 1 1  Comment  pt able to frive however assistance from [employer] and eating ?  N  Using the Toilet?  N  In the past six months, have you accidently leaked urine?  N  Do you have problems with loss of bowel control?  N  Managing your Medications?  N  Managing your Finances?  N  Housekeeping or managing your Housekeeping?  N    Patient Care Team: Dorna Mai, MD as PCP - General (Family Medicine)  Indicate any recent Medical Services you may have received from other than [facility] providers in the past year (date may be approximate).     Assessment:   This is a routine wellness examination for Emily Gray.  Hearing/Vision screen No results found.  Dietary issues and exercise activities discussed:     Goals Addressed   None   Depression Screen    05/29/2022   11:24 AM 05/18/2022    9:42 AM 03/30/2022    9:56 AM 11/18/2021    1:45 PM 11/14/2021    2:38 PM 09/03/2021    1:51 PM 06/02/2021    1:22 PM  PHQ 2/9 Scores  PHQ - 2 Score 0 2 0 0 5 0 1  PHQ- 9 Score 0 8   17 0 5    Fall Risk    05/29/2022   11:26 AM 11/18/2021    1:24 PM 11/14/2021    2:44 PM 11/15/2020   11:13 AM 08/08/2020    1:41 PM  [place] in the past year? 0 0 0 1 1  Number falls in past yr: 0 0 0 1 1  Injury with Fall? 0 1 0 1 1  Risk for fall due to : No Fall Risks No Fall Risks History of  fall(s) History of fall(s) Impaired balance/gait;History of fall(s)  Follow up Falls evaluation completed Falls prevention discussed Falls evaluation completed Falls evaluation completed     [place]:  Any stairs in or around the home? Yes  If so, are there any without handrails? No  Home free of loose throw rugs in walkways, pet beds, electrical cords, etc? Yes  Adequate lighting in your home to reduce risk of falls? Yes   ASSISTIVE DEVICES UTILIZED TO PREVENT FALLS:  Life alert? No  Use of a cane, walker or w/c? Yes  Grab bars in the bathroom? Yes  Shower chair or bench in shower? Yes  Elevated toilet seat or a handicapped toilet? No    Cognitive Function:        05/29/2022   11:31 AM 05/29/2022   11:27 AM 11/14/2021    2:46 PM  6CIT Screen  What Year? 0 points 0 points 0 points  What month? 0 points 0 points 0 points  What time? 0 points 0 points 0 points  Count back from 20 0 points 0 points 0 points  Months in reverse 4 points  4 points  Repeat phrase 8 points 6 points 8 points  Total Score 12 points  12 points    Immunizations Immunization History  Administered Date(s) Administered   Pneumococcal Polysaccharide-23 10/18/2018    TDAP status: Up to date  Flu Vaccine status: Due, Education has been provided regarding the importance of this vaccine. Advised may receive this vaccine at local pharmacy or Health Dept. Aware to provide a copy  of the vaccination record if obtained from local pharmacy or Health Dept. Verbalized acceptance and understanding.  Pneumococcal vaccine status: Due, Education has been provided regarding the importance of this vaccine. Advised may receive this vaccine at local pharmacy or Health Dept. Aware to provide a copy of the vaccination record if obtained from local pharmacy or Health Dept. Verbalized acceptance and understanding.  Covid-19 vaccine status: Declined, Education has been provided regarding  the importance of this vaccine but patient still declined. Advised may receive this vaccine at local pharmacy or Health Dept.or vaccine clinic. Aware to provide a copy of the vaccination record if obtained from local pharmacy or Health Dept. Verbalized acceptance and understanding.  Qualifies for Shingles Vaccine? No   Zostavax completed No   Shingrix Completed?: No.    Education has been provided regarding the importance of this vaccine. Patient has been advised to call insurance company to determine out of pocket expense if they have not yet received this vaccine. Advised may also receive vaccine at local pharmacy or Health Dept. Verbalized acceptance and understanding.  Screening Tests Health Maintenance  Topic Date Due   OPHTHALMOLOGY EXAM  Never done   Diabetic kidney evaluation - Urine ACR  05/08/2021   FOOT EXAM  05/16/2021   HEMOGLOBIN A1C  09/28/2022   Diabetic kidney evaluation - GFR measurement  03/31/2023   Medicare Annual Wellness (AWV)  05/30/2023   MAMMOGRAM  10/25/2023   COLONOSCOPY (Pts 45-49yr Insurance coverage will need to be confirmed)  01/30/2024   TETANUS/TDAP  07/20/2024   DEXA SCAN  Completed   Hepatitis C Screening  Completed   HPV VACCINES  Aged Out   Pneumonia Vaccine 75 Years old  Discontinued   INFLUENZA VACCINE  Discontinued   COVID-19 Vaccine  Discontinued   Zoster Vaccines- Shingrix  Discontinued    Health Maintenance  Health Maintenance Due  Topic Date Due   OPHTHALMOLOGY EXAM  Never done   Diabetic kidney evaluation - Urine ACR  05/08/2021   FOOT EXAM  05/16/2021    Colorectal cancer screening: Type of screening: Colonoscopy. Completed 01/29/2014. Repeat every 10 years  Mammogram status: Completed 2. Repeat every year    Lung Cancer Screening: (Low Dose CT Chest recommended if Age 75-80years, 30 pack-year currently smoking OR have quit w/in 15years.) does not qualify.   Lung Cancer Screening Referral: n/a  Additional  Screening:  Hepatitis C Screening: does qualify; Completed 05/08/2020  Vision Screening: Recommended annual ophthalmology exams for early detection of glaucoma and other disorders of the eye. Is the patient up to date with their annual eye exam?  Yes  Who is the provider or what is the name of the office in which the patient attends annual eye exams? Patient does not remember name.  If pt is not established with a provider, would they like to be referred to a provider to establish care? No .   Dental Screening: Recommended annual dental exams for proper oral hygiene  Community Resource Referral / Chronic Care Management: CRR required this visit?  No   CCM required this visit?  No      Plan:     I have personally reviewed and noted the following in the patient's chart:   Medical and social history Use of alcohol, tobacco or illicit drugs  Current medications and supplements including opioid prescriptions. Patient is not currently taking opioid prescriptions. Functional ability and status Nutritional status Physical activity Advanced directives List of other physicians Hospitalizations, surgeries, and ER visits  in previous 12 months Vitals Screenings to include cognitive, depression, and falls Referrals and appointments  In addition, I have reviewed and discussed with patient certain preventive protocols, quality metrics, and best practice recommendations. A written personalized care plan for preventive services as well as general preventive health recommendations were provided to patient.     Debbora Dus, [place]   05/29/2022   Nurse Notes: provider notified.

## 2022-06-02 ENCOUNTER — Encounter: Payer: Self-pay | Admitting: Family Medicine

## 2022-06-02 NOTE — Telephone Encounter (Signed)
Dx code sent to pharmacy. Patient is aware

## 2022-06-05 ENCOUNTER — Encounter: Payer: Self-pay | Admitting: Physical Therapy

## 2022-06-05 ENCOUNTER — Ambulatory Visit: Payer: Medicare Other | Admitting: Physical Therapy

## 2022-06-05 ENCOUNTER — Other Ambulatory Visit: Payer: Self-pay

## 2022-06-05 DIAGNOSIS — R2689 Other abnormalities of gait and mobility: Secondary | ICD-10-CM

## 2022-06-05 DIAGNOSIS — M25551 Pain in right hip: Secondary | ICD-10-CM | POA: Diagnosis not present

## 2022-06-05 DIAGNOSIS — M6281 Muscle weakness (generalized): Secondary | ICD-10-CM | POA: Diagnosis not present

## 2022-06-05 NOTE — Therapy (Signed)
OUTPATIENT PHYSICAL THERAPY TREATMENT NOTE   Patient Name: Emily Gray MRN: [id number] DOB:21-Jan-1946, 76 y.o., female Today's Date: 06/05/2022  PCP: Dorna Mai, MD   REFERRING PROVIDER: Dorna Mai, MD   END OF SESSION:   PT End of Session - 06/05/22 0922     Visit Number 2    Number of Visits 9    Date for PT Re-Evaluation 07/22/22    Authorization Type MCR A&B    Progress Note Due on Visit 10    PT Start Time 0915    PT Stop Time 1000    PT Time Calculation (min) 45 min    Activity Tolerance Patient tolerated treatment well    Behavior During Therapy WFL for tasks assessed/performed             Past Medical History:  Diagnosis Date   Acid reflux    Arthritis    Breast cancer ([place])    Hypertension    Obesity, unspecified    Pure hyperglyceridemia    Past Surgical History:  Procedure Laterality Date   APPENDECTOMY     BILATERAL SALPINGOOPHORECTOMY     BREAST LUMPECTOMY Right    CATARACT EXTRACTION Left    EYE SURGERY Left 07/20/2008   macro-hole, filled with gel substance at [facility]  12/03/2004   TONSILLECTOMY     TUBAL LIGATION Bilateral    Patient Active Problem List   Diagnosis Date Noted   Encounter for other specified special examinations 11/28/2021   Other specified counseling 11/28/2021   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   DM (diabetes mellitus), type 2 ([place]) 10/20/2018   Obesity 10/20/2018   OSA (obstructive sleep apnea) 10/18/2018   Hypertension    Breast cancer ([place])    Varicose veins of lower extremities with other complications 40/02/6760    REFERRING DIAG: Right hip pain   THERAPY DIAG:  Pain in right hip  Muscle weakness (generalized)  Other abnormalities of gait and mobility  Rationale for Evaluation and Treatment Rehabilitation  PERTINENT HISTORY: History of breast cancer, DM II, HTN, OA bilateral knees.   PRECAUTIONS: None    SUBJECTIVE:    SUBJECTIVE STATEMENT:  Patient reports she is feeling a little better, but she still gets some soreness in the right hip.  PAIN:  Are you having pain? Yes:  NPRS scale: 7/10 (10/10 pain at worst) Pain location: Right hip, anterior Pain description: Stiff Aggravating factors: Housework, getting in and out of the bed, sitting down and getting up from a low chair Relieving factors: OTC medication   OBJECTIVE: (objective measures completed at initial evaluation unless otherwise dated) PATIENT SURVEYS:  FOTO 27% functional status         LOWER EXTREMITY ROM:                       Patient guarded with right hip PROM secondary to pain   LOWER EXTREMITY MMT:   MMT Right eval Left eval  Hip flexion 3 4  Hip extension 2 3  Hip abduction 2 3  Knee flexion 5 5  Knee extension 5 5   FUNCTIONAL TESTS:  5 times sit to stand: 26 seconds 2 minute walk test: 180   GAIT: Distance walked: 180 ft Assistive device utilized: Single point cane - used on right side Level of assistance: Modified independence Comments: decreased gait speed, trendelenburg on right; patient using SPC on right side that is too tall 06/05/2022: SPC used  on left with significant cueing for coordinating movement and she was using step-to pattern, then performed without AD and she demonstrates improved step-through pattern with trendelenburg on right    TODAY'S TREATMENT: [facility] Adult PT Treatment:                                                DATE: 06/05/2022 Therapeutic Exercise: NuStep L5 x 5 min with UE/LE while taking subjective LAQ with 3# 2 x 20 each Standing alternating march at counter with 3# 2 x 20 Standing hip abduction with 3# 2 x 10 each Standing heel raises 3# 2 x 10 Seated LAQ with green x 10 each for HEP demo Forward 4" step-up 2 x 10 each Romberg stance on Airex 2 x 60 sec Sit to stand x 10 without UE support Gait Training: Practiced using SPC on left side, lowered cane to appropriate height, max cueing for  coordination of cane with right leg Practiced walking without AD   [facility] Adult PT Treatment:                                                DATE: 05/27/2022 Therapeutic Exercise: Sit to stand without UE support x 10 Seated clamshell with green x 15 Seated alternating march with green x 20   PATIENT EDUCATION:  Education details: HEP update Person educated: Patient Education method: Explanation, Demonstration, Tactile cues, Verbal cues, and Handouts Education comprehension: verbalized understanding, returned demonstration, verbal cues required, tactile cues required, and needs further education   HOME EXERCISE PROGRAM: Access Code: [email or web address]      ASSESSMENT: CLINICAL IMPRESSION: Patient tolerated therapy well with no adverse effects. Worked on ambulation with SPC used on right side and patient require significant cueing to properly coordinate the movement, she actually ambulates much better and without any unsteadiness when not using an AD so instructed patient to reduce use of cane unless on unlevel surfaces or if she feels fatigued with walking extended periods. Therapy focused primarily on strengthening for LE and progressed to standing exercises with good tolerance. She does require cueing for proper exercise technique and reported fatigue following therapy. Updated HEP to progress standing strengthening at home. Patient would benefit from continued skilled PT to progress her mobility and strength in order to reduce pain and maximize functional ability.     OBJECTIVE IMPAIRMENTS: Abnormal gait, decreased activity tolerance, decreased balance, decreased ROM, decreased strength, impaired flexibility, improper body mechanics, postural dysfunction, and pain.    ACTIVITY LIMITATIONS: bending, sitting, standing, squatting, stairs, bathing, dressing, and locomotion level   PARTICIPATION LIMITATIONS: meal prep, cleaning, laundry, shopping, and community activity   PERSONAL FACTORS: Fitness,  Past/current experiences, and Time since onset of injury/illness/exacerbation are also affecting patient's functional outcome.      GOALS: Goals reviewed with patient? Yes   SHORT TERM GOALS: Target date: 06/24/2022    Patient will be I with initial HEP in order to progress with therapy. Baseline: HEP provided at eval Goal status: INITIAL   2.  PT will review FOTO with patient by 3rd visit in order to understand expected progress and outcome with therapy. Baseline: FOTO assessed at eval Goal status: INITIAL   LONG TERM GOALS: Target date: 07/22/2022  Patient will be I with final HEP to maintain progress from PT. Baseline: HEP provided at eval Goal status: INITIAL   2.  Patient will report >/= 46% status on FOTO to indicate improved functional ability. Baseline: 27% functional status Goal status: INITIAL   3.  Patient will demonstrate 5xSTS </= 15 seconds in order to indicate improved LE strength and mobility Baseline: 26 seconds Goal status: INITIAL   4.  Patient will perform 2MWT >/= 300 ft using LRAD in order to indicate improved community access Baseline: 180 ft using SPC Goal status: INITIAL   5. Patient will report right hip pain with activity </= 5/10 in order to reduce functional limitations.  Baseline: 10/10 when pain occurs Goal status: INITIAL     PLAN: PT FREQUENCY: 1x/week   PT DURATION: 8 weeks   PLANNED INTERVENTIONS: Therapeutic exercises, Therapeutic activity, Neuromuscular re-education, Balance training, Gait training, Patient/Family education, Self Care, Joint mobilization, Joint manipulation, Aquatic Therapy, Dry Needling, Cryotherapy, Moist heat, Manual therapy, and Re-evaluation   PLAN FOR NEXT SESSION: Review HEP and progress PRN, focus primarily on right hip strengthening, progressing to standing exercises as able, gait training   Hilda Blades, PT, DPT, LAT, ATC 06/05/22  10:06 AM Phone: [phone] Fax: [phone]

## 2022-06-05 NOTE — Patient Instructions (Signed)
Access Code: [email or web address] URL: [email or web address] Date: 06/05/2022 Prepared by: Hilda Blades  Exercises - Sit to Stand with Arms Crossed  - 3-5 x daily - 10 reps - Seated Hip Abduction with Resistance  - 1 x daily - 2 sets - 20 reps - Seated March with Resistance  - 1 x daily - 2 sets - 20 reps - Seated Knee Extension with Resistance  - 1 x daily - 2 sets - 20 reps - Standing March with Counter Support  - 1 x daily - 2 sets - 20 reps - Standing Hip Abduction with Counter Support  - 1 x daily - 2 sets - 10 reps - Heel Raises with Counter Support  - 1 x daily - 2 sets - 10 reps

## 2022-06-08 NOTE — Therapy (Signed)
OUTPATIENT PHYSICAL THERAPY TREATMENT NOTE   Patient Name: Emily Gray MRN: [id number] DOB:[birth year], 76 y.o., female Today's Date: 06/10/2022  PCP: Dorna Mai, MD   REFERRING PROVIDER: Dorna Mai, MD   END OF SESSION:   PT End of Session - 06/10/22 1102     Visit Number 3    Number of Visits 9    Date for PT Re-Evaluation 07/22/22    Authorization Type MCR A&B    Progress Note Due on Visit 10    PT Start Time 1045    PT Stop Time 1125    PT Time Calculation (min) 40 min    Activity Tolerance Patient tolerated treatment well    Behavior During Therapy WFL for tasks assessed/performed              Past Medical History:  Diagnosis Date   Acid reflux    Arthritis    Breast cancer ([place])    Hypertension    Obesity, unspecified    Pure hyperglyceridemia    Past Surgical History:  Procedure Laterality Date   APPENDECTOMY     BILATERAL SALPINGOOPHORECTOMY     BREAST LUMPECTOMY Right    CATARACT EXTRACTION Left    EYE SURGERY Left 07/20/2008   macro-hole, filled with gel substance at [facility]  12/03/2004   TONSILLECTOMY     TUBAL LIGATION Bilateral    Patient Active Problem List   Diagnosis Date Noted   Encounter for other specified special examinations 11/28/2021   Other specified counseling 11/28/2021   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   DM (diabetes mellitus), type 2 ([place]) 10/20/2018   Obesity 10/20/2018   OSA (obstructive sleep apnea) 10/18/2018   Hypertension    Breast cancer ([place])    Varicose veins of lower extremities with other complications 46/96/2952    REFERRING DIAG: Right hip pain   THERAPY DIAG:  Pain in right hip  Muscle weakness (generalized)  Other abnormalities of gait and mobility  Rationale for Evaluation and Treatment Rehabilitation  PERTINENT HISTORY: History of breast cancer, DM II, HTN, OA bilateral knees.   PRECAUTIONS: None    SUBJECTIVE:    SUBJECTIVE  STATEMENT: Patient reports she is feeling pretty good. She is consistent with her exercises and they are going well.   PAIN:  Are you having pain? Yes:  NPRS scale: 7/10 (10/10 pain at worst) Pain location: Right hip, anterior Pain description: Stiff Aggravating factors: Housework, getting in and out of the bed, sitting down and getting up from a low chair Relieving factors: OTC medication   OBJECTIVE: (objective measures completed at initial evaluation unless otherwise dated) PATIENT SURVEYS:  FOTO 27% functional status         LOWER EXTREMITY ROM:                       Patient guarded with right hip PROM secondary to pain   LOWER EXTREMITY MMT:   MMT Right eval Left eval Right 06/10/22  Hip flexion 3 4   Hip extension '2 3 2  '$ Hip abduction '2 3 2  '$ Knee flexion 5 5   Knee extension 5 5    FUNCTIONAL TESTS:  5 times sit to stand: 26 seconds 2 minute walk test: 180   GAIT: Distance walked: 180 ft Assistive device utilized: Single point cane - used on right side Level of assistance: Modified independence Comments: decreased gait speed, trendelenburg on right; patient using SPC on  right side that is too tall 06/05/2022: SPC used on left with significant cueing for coordinating movement and she was using step-to pattern, then performed without AD and she demonstrates improved step-through pattern with trendelenburg on right    TODAY'S TREATMENT: [facility] Adult PT Treatment:                                                DATE: 06/10/2022 Therapeutic Exercise: NuStep L5 x 5 min with UE/LE while taking subjective LAQ with 4# 2 x 20 each Standing alternating march at counter with 4# 2 x 20 Standing hip abduction with 4# 2 x 10 each Standing heel raises 4# 2 x 15 Sit to stand 2 x 10 without UE support Tandem stance 3 x 30 sec each Forward 4" step-up 2 x 10 each   [facility] Adult PT Treatment:                                                DATE: 06/05/2022 Therapeutic  Exercise: NuStep L5 x 5 min with UE/LE while taking subjective LAQ with 3# 2 x 20 each Standing alternating march at counter with 3# 2 x 20 Standing hip abduction with 3# 2 x 10 each Standing heel raises 3# 2 x 10 Seated LAQ with green x 10 each for HEP demo Forward 4" step-up 2 x 10 each Romberg stance on Airex 2 x 60 sec Sit to stand x 10 without UE support Gait Training: Practiced using SPC on left side, lowered cane to appropriate height, max cueing for coordination of cane with right leg Practiced walking without AD  [facility] Adult PT Treatment:                                                DATE: 05/27/2022 Therapeutic Exercise: Sit to stand without UE support x 10 Seated clamshell with green x 15 Seated alternating march with green x 20   PATIENT EDUCATION:  Education details: HEP update Person educated: Patient Education method: Explanation, Demonstration, Tactile cues, Verbal cues, and Handouts Education comprehension: verbalized understanding, returned demonstration, verbal cues required, tactile cues required, and needs further education   HOME EXERCISE PROGRAM: Access Code: [email or web address]      ASSESSMENT: CLINICAL IMPRESSION: Patient tolerated therapy well with no adverse effects. Therapy continues to focus primarily on progressing hip and LE strengthening with good tolerance. She was able to increase resistance with exercises and tolerated progression to narrow based support with standing stability. Patient continues to exhibit gross hip weakness. Updated HEP to perform tandem stance at home. Patient would benefit from continued skilled PT to progress her mobility and strength in order to reduce pain and maximize functional ability.     OBJECTIVE IMPAIRMENTS: Abnormal gait, decreased activity tolerance, decreased balance, decreased ROM, decreased strength, impaired flexibility, improper body mechanics, postural dysfunction, and pain.    ACTIVITY LIMITATIONS: bending, sitting,  standing, squatting, stairs, bathing, dressing, and locomotion level   PARTICIPATION LIMITATIONS: meal prep, cleaning, laundry, shopping, and community activity   PERSONAL FACTORS: Fitness, Past/current experiences, and Time since onset of injury/illness/exacerbation are also  affecting patient's functional outcome.      GOALS: Goals reviewed with patient? Yes   SHORT TERM GOALS: Target date: 06/24/2022    Patient will be I with initial HEP in order to progress with therapy. Baseline: HEP provided at eval Goal status: INITIAL   2.  PT will review FOTO with patient by 3rd visit in order to understand expected progress and outcome with therapy. Baseline: FOTO assessed at eval Goal status: INITIAL   LONG TERM GOALS: Target date: 07/22/2022    Patient will be I with final HEP to maintain progress from PT. Baseline: HEP provided at eval Goal status: INITIAL   2.  Patient will report >/= 46% status on FOTO to indicate improved functional ability. Baseline: 27% functional status Goal status: INITIAL   3.  Patient will demonstrate 5xSTS </= 15 seconds in order to indicate improved LE strength and mobility Baseline: 26 seconds Goal status: INITIAL   4.  Patient will perform 2MWT >/= 300 ft using LRAD in order to indicate improved community access Baseline: 180 ft using SPC Goal status: INITIAL   5. Patient will report right hip pain with activity </= 5/10 in order to reduce functional limitations.  Baseline: 10/10 when pain occurs Goal status: INITIAL     PLAN: PT FREQUENCY: 1x/week   PT DURATION: 8 weeks   PLANNED INTERVENTIONS: Therapeutic exercises, Therapeutic activity, Neuromuscular re-education, Balance training, Gait training, Patient/Family education, Self Care, Joint mobilization, Joint manipulation, Aquatic Therapy, Dry Needling, Cryotherapy, Moist heat, Manual therapy, and Re-evaluation   PLAN FOR NEXT SESSION: Review HEP and progress PRN, focus primarily on right hip  strengthening, progressing to standing exercises as able, gait training   Hilda Blades, PT, DPT, LAT, ATC 06/10/22  11:32 AM Phone: [phone] Fax: [phone]

## 2022-06-10 ENCOUNTER — Other Ambulatory Visit: Payer: Self-pay

## 2022-06-10 ENCOUNTER — Encounter: Payer: Self-pay | Admitting: Physical Therapy

## 2022-06-10 ENCOUNTER — Ambulatory Visit: Payer: Medicare Other | Admitting: Physical Therapy

## 2022-06-10 DIAGNOSIS — R2689 Other abnormalities of gait and mobility: Secondary | ICD-10-CM

## 2022-06-10 DIAGNOSIS — M6281 Muscle weakness (generalized): Secondary | ICD-10-CM

## 2022-06-10 DIAGNOSIS — M25551 Pain in right hip: Secondary | ICD-10-CM | POA: Diagnosis not present

## 2022-06-10 NOTE — Patient Instructions (Signed)
Access Code: [email or web address] URL: [email or web address] Date: 06/10/2022 Prepared by: Hilda Blades  Exercises - Sit to Stand with Arms Crossed  - 3-5 x daily - 10 reps - Seated Hip Abduction with Resistance  - 1 x daily - 2 sets - 20 reps - Seated March with Resistance  - 1 x daily - 2 sets - 20 reps - Seated Knee Extension with Resistance  - 1 x daily - 2 sets - 20 reps - Standing March with Counter Support  - 1 x daily - 2 sets - 20 reps - Standing Hip Abduction with Counter Support  - 1 x daily - 2 sets - 10 reps - Heel Raises with Counter Support  - 1 x daily - 2 sets - 10 reps - Standing Tandem Balance with Counter Support  - 1 x daily - 3 sets - 30 seconds hold

## 2022-06-16 ENCOUNTER — Ambulatory Visit: Payer: Medicare Other | Attending: Family Medicine | Admitting: Pharmacist

## 2022-06-16 ENCOUNTER — Encounter: Payer: Self-pay | Admitting: Pharmacist

## 2022-06-16 VITALS — Wt 239.0 lb

## 2022-06-16 DIAGNOSIS — Z794 Long term (current) use of insulin: Secondary | ICD-10-CM | POA: Diagnosis not present

## 2022-06-16 DIAGNOSIS — E1165 Type 2 diabetes mellitus with hyperglycemia: Secondary | ICD-10-CM

## 2022-06-16 DIAGNOSIS — E119 Type 2 diabetes mellitus without complications: Secondary | ICD-10-CM | POA: Insufficient documentation

## 2022-06-16 DIAGNOSIS — M810 Age-related osteoporosis without current pathological fracture: Secondary | ICD-10-CM | POA: Diagnosis not present

## 2022-06-16 DIAGNOSIS — I10 Essential (primary) hypertension: Secondary | ICD-10-CM | POA: Diagnosis not present

## 2022-06-16 MED ORDER — SEMAGLUTIDE (2 MG/DOSE) 8 MG/3ML ~~LOC~~ SOPN: 2.0000 mg | PEN_INJECTOR | SUBCUTANEOUS | 1 refills | Status: DC

## 2022-06-16 NOTE — Progress Notes (Signed)
    S:     No chief complaint on file.  Emily Gray is a 76 y.o. female who presents for diabetes evaluation, education, and management. PMH is significant for HTN, T2DM, OA.   Patient was referred and last seen by Primary Care Provider, Dr. Redmond Pulling, on 03/30/2022. I last saw her on 04/16/2022. At that visit, we discontinued her Semglee and titrated Ozempic to 1 mg/weekly.   Today, patient arrives in good spirits and presents without any assistance. Since her last visit, she has tolerated the Ozempic 1 mg dose well. Denies any NV, abdominal pain. No changes in vision. She wishes to increase to the '2mg'$  dose for more weight loss.   Patient reports Diabetes is longstanding.   Family/Social History:  -Fhx: HTN -Tobacco: former smoker  Current diabetes medications include: Ozempic 1 mg once weekly Patient reports adherence to taking all medications as prescribed.   Insurance coverage: [place], [place]  Patient denies hypoglycemic events.   Patient denies nocturia (nighttime urination).  Patient denies any changes in neuropathy (nerve pain). Patient denies visual changes. Patient reports self foot exams.   Patient reported dietary habits: -Patient cuts off bread completely, eats a lot of veggies. -Drinks zero sodas every once in a while  Patient-reported exercise habits:  -Patient reports walking 15-30 minutes daily.   O:  90 day average blood glucose per home meter: 142 mg/dL  Lab Results  Component Value Date   HGBA1C 7.3 (H) 03/30/2022   There were no vitals filed for this visit.  Lipid Panel     Component Value Date/Time   CHOL 133 03/30/2022 1046   TRIG 52 03/30/2022 1046   HDL 39 (L) 03/30/2022 1046   CHOLHDL 3.4 03/30/2022 1046   LDLCALC 82 03/30/2022 1046   LDLDIRECT 85 08/08/2020 1419   Clinical Atherosclerotic Cardiovascular Disease (ASCVD): No  The 10-year ASCVD risk score (Arnett DK, et al., 2019) is: 20.5%   Values used to calculate the score:      Age: 89 years     Sex: Female     Is Non-Hispanic African American: Yes     Diabetic: Yes     Tobacco smoker: No     Systolic Blood Pressure: 229 mmHg     Is BP treated: Yes     HDL Cholesterol: 39 mg/dL     Total Cholesterol: 133 mg/dL   A/P: Diabetes longstanding currently close to goal (A1C 7.3%). Patient is able to verbalize appropriate hypoglycemia management plan. Medication adherence appears appropriate. Home glycemic control looks good. -Increased dose of GLP-1 Ozempic to 2 mg once weekly for weight loss benefit. -Patient educated on purpose, proper use, and potential adverse effects of Ozempic.  -Extensively discussed pathophysiology of diabetes, recommended lifestyle interventions, dietary effects on blood sugar control.  -Counseled on s/sx of and management of hypoglycemia.  -Next A1c anticipated 06/2022.   Written patient instructions provided. Patient verbalized understanding of treatment plan.  Total time in face to face counseling 30 minutes.    Follow-up:  -Follow up with Dr. Redmond Pulling 06/30/2022.   Pharmacist:  Benard Halsted, PharmD, [place], [place] [phone]

## 2022-06-17 NOTE — Therapy (Signed)
OUTPATIENT PHYSICAL THERAPY TREATMENT NOTE   Patient Name: Emily Gray MRN: [id number] DOB:April 20, 1946, 76 y.o., female Today's Date: 06/19/2022  PCP: Dorna Mai, MD   REFERRING PROVIDER: Dorna Mai, MD   END OF SESSION:   PT End of Session - 06/19/22 0823     Visit Number 4    Number of Visits 9    Date for PT Re-Evaluation 07/22/22    Authorization Type MCR A&B    Progress Note Due on Visit 10    PT Start Time 0830    PT Stop Time 0915    PT Time Calculation (min) 45 min    Activity Tolerance Patient tolerated treatment well    Behavior During Therapy WFL for tasks assessed/performed               Past Medical History:  Diagnosis Date   Acid reflux    Arthritis    Breast cancer ([place])    Hypertension    Obesity, unspecified    Pure hyperglyceridemia    Past Surgical History:  Procedure Laterality Date   APPENDECTOMY     BILATERAL SALPINGOOPHORECTOMY     BREAST LUMPECTOMY Right    CATARACT EXTRACTION Left    EYE SURGERY Left 07/20/2008   macro-hole, filled with gel substance at [facility]  12/03/2004   TONSILLECTOMY     TUBAL LIGATION Bilateral    Patient Active Problem List   Diagnosis Date Noted   Encounter for other specified special examinations 11/28/2021   Other specified counseling 11/28/2021   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   DM (diabetes mellitus), type 2 ([place]) 10/20/2018   Obesity 10/20/2018   OSA (obstructive sleep apnea) 10/18/2018   Hypertension    Breast cancer ([place])    Varicose veins of lower extremities with other complications 92/92/4462    REFERRING DIAG: Right hip pain   THERAPY DIAG:  Pain in right hip  Muscle weakness (generalized)  Other abnormalities of gait and mobility  Rationale for Evaluation and Treatment Rehabilitation  PERTINENT HISTORY: History of breast cancer, DM II, HTN, OA bilateral knees.   PRECAUTIONS: None    SUBJECTIVE:    SUBJECTIVE  STATEMENT: Patient reports she is feeling pretty good. She is not really using the cane while at her house and will use if for a backup when she is out. Overall she feels like she is moving better but the balance exercise is difficulty for her.   PAIN:  Are you having pain? Yes:  NPRS scale: 7/10 (10/10 pain at worst) Pain location: Right hip, anterior Pain description: Stiff Aggravating factors: Housework, getting in and out of the bed, sitting down and getting up from a low chair Relieving factors: OTC medication   OBJECTIVE: (objective measures completed at initial evaluation unless otherwise dated) PATIENT SURVEYS:  FOTO 27% functional status         LOWER EXTREMITY ROM:                       Patient guarded with right hip PROM secondary to pain   LOWER EXTREMITY MMT:   MMT Right eval Left eval Right 06/10/22  Hip flexion 3 4   Hip extension _0 Hip abduction _1 Knee flexion 5 5   Knee extension 5 5    FUNCTIONAL TESTS:  5 times sit to stand: 26 seconds  06/19/2022: 17 seconds 2 minute walk test: 180  GAIT: Distance walked: 180 ft Assistive device utilized: Single point cane - used on right side Level of assistance: Modified independence Comments: decreased gait speed, trendelenburg on right; patient using SPC on right side that is too tall 06/05/2022: SPC used on left with significant cueing for coordinating movement and she was using step-to pattern, then performed without AD and she demonstrates improved step-through pattern with trendelenburg on right    TODAY'S TREATMENT: [facility] Adult PT Treatment:                                                DATE: 06/19/2022 Therapeutic Exercise: NuStep L6 x 6 min with UE/LE while taking subjective LAQ with 5# 2 x 20 each Standing alternating march at counter with 5# 2 x 20 Standing hip abduction with 5# 2 x 10 each Standing heel raises 5# 2 x 15 Seated clamshell with blue x 20 Seated march with blue x 20 LAQ with  blue x 20 each Tandem stance 3 x 30 sec each Sit to stand 2 x 10 without UE support Rocker board fwd/bwd and lateral taps x 20 each Forward 6" step-up x 10 each, 4" x 10 each   [facility] Adult PT Treatment:                                                DATE: 06/10/2022 Therapeutic Exercise: NuStep L5 x 5 min with UE/LE while taking subjective LAQ with 4# 2 x 20 each Standing alternating march at counter with 4# 2 x 20 Standing hip abduction with 4# 2 x 10 each Standing heel raises 4# 2 x 15 Sit to stand 2 x 10 without UE support Tandem stance 3 x 30 sec each Forward 4" step-up 2 x 10 each  [facility] Adult PT Treatment:                                                DATE: 06/05/2022 Therapeutic Exercise: NuStep L5 x 5 min with UE/LE while taking subjective LAQ with 3# 2 x 20 each Standing alternating march at counter with 3# 2 x 20 Standing hip abduction with 3# 2 x 10 each Standing heel raises 3# 2 x 10 Seated LAQ with green x 10 each for HEP demo Forward 4" step-up 2 x 10 each Romberg stance on Airex 2 x 60 sec Sit to stand x 10 without UE support Gait Training: Practiced using SPC on left side, lowered cane to appropriate height, max cueing for coordination of cane with right leg Practiced walking without AD   PATIENT EDUCATION:  Education details: HEP Person educated: Patient Education method: Consulting civil engineer, Demonstration, Corporate treasurer cues, Verbal cues Education comprehension: verbalized understanding, returned demonstration, verbal cues required, tactile cues required, and needs further education   HOME EXERCISE PROGRAM: Access Code: [email or web address]      ASSESSMENT: CLINICAL IMPRESSION: Patient tolerated therapy well with no adverse effects. Therapy continues to focus on progressing hip and LE strengthening with good tolerance. She does report fatigue following therapy this visit. She was able to progress with standing stability exercises with rocker board and  continues to do well with  tandem stance. She was able to tolerate increased weight for strengthening and increased height for step-up. She was provided a blue band for HEP and demonstrated good form int therapy. Patient would benefit from continued skilled PT to progress her mobility and strength in order to reduce pain and maximize functional ability.     OBJECTIVE IMPAIRMENTS: Abnormal gait, decreased activity tolerance, decreased balance, decreased ROM, decreased strength, impaired flexibility, improper body mechanics, postural dysfunction, and pain.    ACTIVITY LIMITATIONS: bending, sitting, standing, squatting, stairs, bathing, dressing, and locomotion level   PARTICIPATION LIMITATIONS: meal prep, cleaning, laundry, shopping, and community activity   PERSONAL FACTORS: Fitness, Past/current experiences, and Time since onset of injury/illness/exacerbation are also affecting patient's functional outcome.      GOALS: Goals reviewed with patient? Yes   SHORT TERM GOALS: Target date: 06/24/2022    Patient will be I with initial HEP in order to progress with therapy. Baseline: HEP provided at eval 06/19/2022: progressing Goal status: MET   2.  PT will review FOTO with patient by 3rd visit in order to understand expected progress and outcome with therapy. Baseline: FOTO assessed at eval 06/19/2022: reviewed Goal status: MET   LONG TERM GOALS: Target date: 07/22/2022    Patient will be I with final HEP to maintain progress from PT. Baseline: HEP provided at eval Goal status: INITIAL   2.  Patient will report >/= 46% status on FOTO to indicate improved functional ability. Baseline: 27% functional status Goal status: INITIAL   3.  Patient will demonstrate 5xSTS </= 15 seconds in order to indicate improved LE strength and mobility Baseline: 26 seconds 06/19/2022: 17 seconds Goal status: ONGOING   4.  Patient will perform 2MWT >/= 300 ft using LRAD in order to indicate improved community access Baseline: 180 ft  using SPC Goal status: INITIAL   5. Patient will report right hip pain with activity </= 5/10 in order to reduce functional limitations.  Baseline: 10/10 when pain occurs Goal status: INITIAL     PLAN: PT FREQUENCY: 1x/week   PT DURATION: 8 weeks   PLANNED INTERVENTIONS: Therapeutic exercises, Therapeutic activity, Neuromuscular re-education, Balance training, Gait training, Patient/Family education, Self Care, Joint mobilization, Joint manipulation, Aquatic Therapy, Dry Needling, Cryotherapy, Moist heat, Manual therapy, and Re-evaluation   PLAN FOR NEXT SESSION: Review HEP and progress PRN, focus primarily on right hip strengthening, progressing standing strengthening and balance   Hilda Blades, PT, DPT, LAT, ATC 06/19/22  9:22 AM Phone: [phone] Fax: [phone]

## 2022-06-19 ENCOUNTER — Other Ambulatory Visit: Payer: Self-pay

## 2022-06-19 ENCOUNTER — Ambulatory Visit: Payer: Medicare Other | Attending: Family Medicine | Admitting: Physical Therapy

## 2022-06-19 ENCOUNTER — Encounter: Payer: Self-pay | Admitting: Physical Therapy

## 2022-06-19 DIAGNOSIS — R2689 Other abnormalities of gait and mobility: Secondary | ICD-10-CM | POA: Insufficient documentation

## 2022-06-19 DIAGNOSIS — M6281 Muscle weakness (generalized): Secondary | ICD-10-CM | POA: Insufficient documentation

## 2022-06-19 DIAGNOSIS — M25551 Pain in right hip: Secondary | ICD-10-CM | POA: Insufficient documentation

## 2022-06-26 ENCOUNTER — Other Ambulatory Visit: Payer: Self-pay

## 2022-06-26 ENCOUNTER — Encounter: Payer: Self-pay | Admitting: Physical Therapy

## 2022-06-26 ENCOUNTER — Ambulatory Visit: Payer: Medicare Other | Admitting: Physical Therapy

## 2022-06-26 DIAGNOSIS — M6281 Muscle weakness (generalized): Secondary | ICD-10-CM

## 2022-06-26 DIAGNOSIS — M25551 Pain in right hip: Secondary | ICD-10-CM | POA: Diagnosis not present

## 2022-06-26 DIAGNOSIS — R2689 Other abnormalities of gait and mobility: Secondary | ICD-10-CM | POA: Diagnosis not present

## 2022-06-26 NOTE — Therapy (Signed)
OUTPATIENT PHYSICAL THERAPY TREATMENT NOTE  DISCHARGE   Patient Name: Emily Gray MRN: [id number] DOB:Aug 31, 1945, 76 y.o., female Today's Date: 06/26/2022  PCP: Dorna Mai, MD   REFERRING PROVIDER: Dorna Mai, MD   END OF SESSION:   PT End of Session - 06/26/22 0836     Visit Number 5    Number of Visits 9    Date for PT Re-Evaluation 07/22/22    Authorization Type MCR A&B    Progress Note Due on Visit 10    PT Start Time 0830    PT Stop Time 0915    PT Time Calculation (min) 45 min    Activity Tolerance Patient tolerated treatment well    Behavior During Therapy WFL for tasks assessed/performed                Past Medical History:  Diagnosis Date   Acid reflux    Arthritis    Breast cancer ([place])    Hypertension    Obesity, unspecified    Pure hyperglyceridemia    Past Surgical History:  Procedure Laterality Date   APPENDECTOMY     BILATERAL SALPINGOOPHORECTOMY     BREAST LUMPECTOMY Right    CATARACT EXTRACTION Left    EYE SURGERY Left 07/20/2008   macro-hole, filled with gel substance at [facility]  12/03/2004   TONSILLECTOMY     TUBAL LIGATION Bilateral    Patient Active Problem List   Diagnosis Date Noted   Encounter for other specified special examinations 11/28/2021   Other specified counseling 11/28/2021   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   DM (diabetes mellitus), type 2 ([place]) 10/20/2018   Obesity 10/20/2018   OSA (obstructive sleep apnea) 10/18/2018   Hypertension    Breast cancer ([place])    Varicose veins of lower extremities with other complications 30/86/5784    REFERRING DIAG: Right hip pain   THERAPY DIAG:  Pain in right hip  Muscle weakness (generalized)  Other abnormalities of gait and mobility  Rationale for Evaluation and Treatment Rehabilitation  PERTINENT HISTORY: History of breast cancer, DM II, HTN, OA bilateral knees.   PRECAUTIONS: None    SUBJECTIVE:     SUBJECTIVE STATEMENT: Patient reports she continues to feel alright, she is a little tight this morning. She feels she is ready to be done with therapy, and she is consistent with her HEP.  PAIN:  Are you having pain? Yes:  NPRS scale: 5/10 (8/10 pain at worst) Pain location: Right hip, anterior Pain description: Stiff Aggravating factors: Housework, getting in and out of the bed, sitting down and getting up from a low chair Relieving factors: OTC medication   OBJECTIVE: (objective measures completed at initial evaluation unless otherwise dated) PATIENT SURVEYS:  FOTO 27% functional status  06/26/2022: 52%         LOWER EXTREMITY ROM:                       Patient guarded with right hip PROM secondary to pain   LOWER EXTREMITY MMT:   MMT Right eval Left eval Right 06/10/22  Hip flexion 3 4   Hip extension _0 Hip abduction _1 Knee flexion 5 5   Knee extension 5 5    FUNCTIONAL TESTS:  5 times sit to stand: 26 seconds  06/19/2022: 17 seconds  06/26/2022: 15 seconds 2 minute walk test: 180 06/26/2022: 240 ft   GAIT: Distance  walked: 180 ft  06/26/2022: 240 ft Assistive device utilized: Single point cane - used on right side Level of assistance: Modified independence Comments: decreased gait speed, trendelenburg on right; patient using SPC on right side that is too tall 06/05/2022: SPC used on left with significant cueing for coordinating movement and she was using step-to pattern, then performed without AD and she demonstrates improved step-through pattern with trendelenburg on right    TODAY'S TREATMENT: [facility] Adult PT Treatment:                                                DATE: 06/26/2022 Therapeutic Exercise: NuStep L6 x 5 min with UE/LE while taking subjective LAQ with blue 2 x 20 each Seated clamshell with blue 2 x 20 Seated alternating march with blue 2 x 20 Standing hip abduction with 5# 2 x 15 each Standing heel raises 5# 2 x 15 Sit to stand 2 x 10  without UE support Tandem stance 3 x 30 sec each Therapeutic Activity: Reassessment of objective measures for goals such as 2MWT, FOTO with explanation   [facility] Adult PT Treatment:                                                DATE: 06/19/2022 Therapeutic Exercise: NuStep L6 x 6 min with UE/LE while taking subjective LAQ with 5# 2 x 20 each Standing alternating march at counter with 5# 2 x 20 Standing hip abduction with 5# 2 x 10 each Standing heel raises 5# 2 x 15 Seated clamshell with blue x 20 Seated march with blue x 20 LAQ with blue x 20 each Tandem stance 3 x 30 sec each Sit to stand 2 x 10 without UE support Rocker board fwd/bwd and lateral taps x 20 each Forward 6" step-up x 10 each, 4" x 10 each  [facility] Adult PT Treatment:                                                DATE: 06/10/2022 Therapeutic Exercise: NuStep L5 x 5 min with UE/LE while taking subjective LAQ with 4# 2 x 20 each Standing alternating march at counter with 4# 2 x 20 Standing hip abduction with 4# 2 x 10 each Standing heel raises 4# 2 x 15 Sit to stand 2 x 10 without UE support Tandem stance 3 x 30 sec each Forward 4" step-up 2 x 10 each   PATIENT EDUCATION:  Education details: POC discharge, FOTO, HEP Person educated: Patient Education method: Consulting civil engineer, Demonstration Education comprehension: verbalized understanding, returned demonstration   HOME EXERCISE PROGRAM: Access Code: [email or web address]      ASSESSMENT: CLINICAL IMPRESSION: Patient tolerated therapy well with no adverse effects. She has made great progress with her therapy, reporting improved functional ability and demonstrate improved strength with a reduced fall risk. She does continue to exhibit limitations with her walking capacity and reporting of higher pain levels of the right hip. Patient demonstrates independence with her HEP and she requests to be discharged this visit, so will formally discharge from PT.      OBJECTIVE  IMPAIRMENTS:  Abnormal gait, decreased activity tolerance, decreased balance, decreased ROM, decreased strength, impaired flexibility, improper body mechanics, postural dysfunction, and pain.    ACTIVITY LIMITATIONS: bending, sitting, standing, squatting, stairs, bathing, dressing, and locomotion level   PARTICIPATION LIMITATIONS: meal prep, cleaning, laundry, shopping, and community activity   PERSONAL FACTORS: Fitness, Past/current experiences, and Time since onset of injury/illness/exacerbation are also affecting patient's functional outcome.      GOALS: Goals reviewed with patient? Yes   SHORT TERM GOALS: Target date: 06/24/2022    Patient will be I with initial HEP in order to progress with therapy. Baseline: HEP provided at eval 06/19/2022: progressing Goal status: MET   2.  PT will review FOTO with patient by 3rd visit in order to understand expected progress and outcome with therapy. Baseline: FOTO assessed at eval 06/19/2022: reviewed Goal status: MET   LONG TERM GOALS: Target date: 07/22/2022    Patient will be I with final HEP to maintain progress from PT. Baseline: HEP provided at eval 06/26/2022: independent Goal status: MET   2.  Patient will report >/= 46% status on FOTO to indicate improved functional ability. Baseline: 27% functional status 06/26/2022: 52% Goal status: MET   3.  Patient will demonstrate 5xSTS </= 15 seconds in order to indicate improved LE strength and mobility Baseline: 26 seconds 06/19/2022: 17 seconds 06/26/2022: 15 seconds Goal status: MET   4.  Patient will perform 2MWT >/= 300 ft using LRAD in order to indicate improved community access Baseline: 180 ft using [facility] 06/26/2022: 240 ft Goal status: PARTIALLY MET   5. Patient will report right hip pain with activity </= 5/10 in order to reduce functional limitations.  Baseline: 10/10 when pain occurs 06/26/2022: 8/10 Goal status: PARTIALLY MET     PLAN: PT FREQUENCY: 1x/week   PT DURATION: 8 weeks    PLANNED INTERVENTIONS: Therapeutic exercises, Therapeutic activity, Neuromuscular re-education, Balance training, Gait training, Patient/Family education, Self Care, Joint mobilization, Joint manipulation, Aquatic Therapy, Dry Needling, Cryotherapy, Moist heat, Manual therapy, and Re-evaluation   PLAN FOR NEXT SESSION: NA - discharge   Hilda Blades, PT, DPT, LAT, ATC 06/26/22  9:20 AM Phone: [phone] Fax: [phone]    PHYSICAL THERAPY DISCHARGE SUMMARY  Visits from Start of Care: 5  Current functional level related to goals / functional outcomes: See above   Remaining deficits: See above   Education / Equipment: HEP   Patient agrees to discharge. Patient goals were partially met. Patient is being discharged due to being pleased with the current functional level.

## 2022-06-30 ENCOUNTER — Ambulatory Visit (INDEPENDENT_AMBULATORY_CARE_PROVIDER_SITE_OTHER): Payer: Medicare Other | Admitting: Family Medicine

## 2022-06-30 ENCOUNTER — Encounter: Payer: Self-pay | Admitting: Family Medicine

## 2022-06-30 VITALS — BP 151/86 | HR 81 | Resp 16 | Wt 233.0 lb

## 2022-06-30 DIAGNOSIS — Z794 Long term (current) use of insulin: Secondary | ICD-10-CM

## 2022-06-30 DIAGNOSIS — E66812 Obesity, class 2: Secondary | ICD-10-CM

## 2022-06-30 DIAGNOSIS — Z853 Personal history of malignant neoplasm of breast: Secondary | ICD-10-CM

## 2022-06-30 DIAGNOSIS — M25551 Pain in right hip: Secondary | ICD-10-CM | POA: Diagnosis not present

## 2022-06-30 DIAGNOSIS — Z6839 Body mass index (BMI) 39.0-39.9, adult: Secondary | ICD-10-CM | POA: Diagnosis not present

## 2022-06-30 DIAGNOSIS — E782 Mixed hyperlipidemia: Secondary | ICD-10-CM

## 2022-06-30 DIAGNOSIS — E1165 Type 2 diabetes mellitus with hyperglycemia: Secondary | ICD-10-CM

## 2022-06-30 DIAGNOSIS — I10 Essential (primary) hypertension: Secondary | ICD-10-CM

## 2022-06-30 DIAGNOSIS — G4733 Obstructive sleep apnea (adult) (pediatric): Secondary | ICD-10-CM

## 2022-06-30 MED ORDER — FREESTYLE LIBRE 2 SENSOR MISC: 1 refills | Status: DC

## 2022-06-30 MED ORDER — LISINOPRIL-HYDROCHLOROTHIAZIDE 20-12.5 MG PO TABS: 2.0000 | ORAL_TABLET | Freq: Every day | ORAL | 1 refills | Status: DC

## 2022-06-30 MED ORDER — ATORVASTATIN CALCIUM 20 MG PO TABS: 20.0000 mg | ORAL_TABLET | Freq: Every day | ORAL | 1 refills | Status: DC

## 2022-06-30 MED ORDER — SEMAGLUTIDE (2 MG/DOSE) 8 MG/3ML ~~LOC~~ SOPN: 2.0000 mg | PEN_INJECTOR | SUBCUTANEOUS | 1 refills | Status: DC

## 2022-06-30 NOTE — Progress Notes (Unsigned)
Patient is here for their 3/6 month follow-up Patient has no concerns today Care gaps have been discussed with patient  

## 2022-07-02 ENCOUNTER — Encounter: Payer: Self-pay | Admitting: Family Medicine

## 2022-07-02 NOTE — Progress Notes (Signed)
Established Patient Office Visit  Subjective    Patient ID: Emily Gray, female    DOB: [birth year]  Age: 76 y.o. MRN: [id number]  CC:  Chief Complaint  Patient presents with   Follow-up   Diabetes    HPI Emily Gray presents for routine follow up of chronic med issues. Patient denies acute complaints or concerns.    Outpatient Encounter Medications as of 06/30/2022  Medication Sig   alendronate (FOSAMAX) 10 MG tablet Take by mouth.   aspirin 81 MG tablet Take 81 mg by mouth daily.   atorvastatin (LIPITOR) 20 MG tablet Take 1 tablet (20 mg total) by mouth daily.   Blood Glucose Monitoring Suppl (ONETOUCH VERIO REFLECT) w/Device KIT Use to check blood sugar TID. E11.65   cholecalciferol (VITAMIN D3) 25 MCG (1000 UT) tablet Take 1 tablet (1,000 Units total) by mouth daily.   COD LIVER OIL/VITAMINS A & D PO Take by mouth.   Continuous Blood Gluc Sensor (FREESTYLE LIBRE 2 SENSOR) MISC Utilize as directed q2 weeks to monitor blood glucose - E11.9   furosemide (LASIX) 20 MG tablet Take by mouth.   glucose blood (ONETOUCH VERIO) test strip Use to check blood sugar TID. E11.65   ibuprofen (ADVIL) 600 MG tablet Take 1 tablet (600 mg total) by mouth every 8 (eight) hours as needed. Use sparingly   Insulin Pen Needle 32G X 4 MM MISC Use with insulin pen daily. Dx: E11.9   lisinopril-hydrochlorothiazide (ZESTORETIC) 20-12.5 MG tablet Take 2 tablets by mouth daily.   Semaglutide, 2 MG/DOSE, 8 MG/3ML SOPN Inject 2 mg as directed once a week. Dx: E11.65   VITAMIN D PO Take by mouth.   [DISCONTINUED] aspirin 81 MG chewable tablet Chew by mouth.   [DISCONTINUED] atorvastatin (LIPITOR) 20 MG tablet Take 1 tablet (20 mg total) by mouth daily.   [DISCONTINUED] Continuous Blood Gluc Sensor (FREESTYLE LIBRE 2 SENSOR) MISC Utilize as directed q2 weeks to monitor blood glucose - E11.9   [DISCONTINUED] cyanocobalamin ([employer] VITAMIN B12) 1000 MCG tablet Take by mouth.   [DISCONTINUED] diclofenac Sodium  (VOLTAREN) 1 % GEL Apply topically. (Patient not taking: Reported on 05/29/2022)   [DISCONTINUED] gabapentin (NEURONTIN) 300 MG capsule Take 1 capsule (300 mg total) by mouth 3 (three) times daily. (Patient not taking: Reported on 05/29/2022)   [DISCONTINUED] gabapentin (NEURONTIN) 300 MG capsule Take 1 capsule (300 mg total) by mouth 3 (three) times daily. (Patient not taking: Reported on 05/29/2022)   [DISCONTINUED] Lancets (ONETOUCH DELICA PLUS [id number]) MISC Use to check blood sugar TID. E11.65   [DISCONTINUED] lisinopril-hydrochlorothiazide (ZESTORETIC) 20-12.5 MG tablet Take 2 tablets by mouth daily.   [DISCONTINUED] Semaglutide, 2 MG/DOSE, 8 MG/3ML SOPN Inject 2 mg as directed once a week. Dx: E11.65   No facility-administered encounter medications on file as of 06/30/2022.    Past Medical History:  Diagnosis Date   Acid reflux    Arthritis    Breast cancer ([place])    Hypertension    Obesity, unspecified    Pure hyperglyceridemia     Past Surgical History:  Procedure Laterality Date   APPENDECTOMY     BILATERAL SALPINGOOPHORECTOMY     BREAST LUMPECTOMY Right    CATARACT EXTRACTION Left    EYE SURGERY Left 07/20/2008   macro-hole, filled with gel substance at [facility]  12/03/2004   TONSILLECTOMY     TUBAL LIGATION Bilateral     Family History  Problem Relation Age of Onset  Hypertension Mother    Cancer Father    Hypertension Father    Breast cancer Sister     Social History   Socioeconomic History   Marital status: Married    Spouse name: Not on file   Number of children: Not on file   Years of education: Not on file   Highest education level: Not on file  Occupational History   Not on file  Tobacco Use   Smoking status: Former   Smokeless tobacco: Never  Vaping Use   Vaping Use: Never used  Substance and Sexual Activity   Alcohol use: No   Drug use: No   Sexual activity: Not on file  Other Topics Concern   Not on file  Social  History Narrative   Not on file   Social Determinants of Health   Financial Resource Strain: Low Risk  (05/29/2022)   Overall Financial Resource Strain (CARDIA)    Difficulty of Paying Living Expenses: Not hard at all  Food Insecurity: No Food Insecurity (05/29/2022)   Hunger Vital Sign    Worried About Running Out of Food in the Last Year: Never true    [place] in the Last Year: Never true  Transportation Needs: No Transportation Needs (05/29/2022)   PRAPARE - Hydrologist (Medical): No    Lack of Transportation (Non-Medical): No  Physical Activity: Insufficiently Active (05/29/2022)   Exercise Vital Sign    Days of Exercise per Week: 1 day    Minutes of Exercise per Session: 20 min  Stress: Stress Concern Present (05/29/2022)   [place]    Feeling of Stress : To some extent  Social Connections: Moderately Isolated (05/29/2022)   Social Connection and Isolation Panel [NHANES]    Frequency of Communication with Friends and Family: More than three times a week    Frequency of Social Gatherings with Friends and Family: Once a week    Attends Religious Services: Never    Marine scientist or Organizations: No    Attends Archivist Meetings: Never    Marital Status: Married  Human resources officer Violence: Not At Risk (05/29/2022)   Humiliation, Afraid, Rape, and Kick questionnaire    Fear of Current or Ex-Partner: No    Emotionally Abused: No    Physically Abused: No    Sexually Abused: No    Review of Systems  All other systems reviewed and are negative.       Objective    BP (!) 151/86   Pulse 81   Resp 16   Wt 233 lb (105.7 kg)   SpO2 95%   BMI 39.97 kg/m   Physical Exam Vitals and nursing note reviewed.  Constitutional:      General: She is not in acute distress. Cardiovascular:     Rate and Rhythm: Normal rate and regular rhythm.  Pulmonary:      Effort: Pulmonary effort is normal.     Breath sounds: Normal breath sounds.  Neurological:     General: No focal deficit present.     Mental Status: She is alert and oriented to person, place, and time.         Assessment & Plan:   1. Type 2 diabetes mellitus with hyperglycemia, with long-term current use of insulin (HCC) Improved A12c and now at goal. continue - Semaglutide, 2 MG/DOSE, 8 MG/3ML SOPN; Inject 2 mg as directed once a week. Dx:  E11.65  Dispense: 9 mL; Refill: 1  2. Primary hypertension Slightly elevated reading. Continue and monitor - lisinopril-hydrochlorothiazide (ZESTORETIC) 20-12.5 MG tablet; Take 2 tablets by mouth daily.  Dispense: 180 tablet; Refill: 1  3. Mixed hyperlipidemia Continue. Meds refilled.  - atorvastatin (LIPITOR) 20 MG tablet; Take 1 tablet (20 mg total) by mouth daily.  Dispense: 90 tablet; Refill: 1  4. Class 2 severe obesity due to excess calories with serious comorbidity and body mass index (BMI) of 39.0 to 39.9 in adult [facility]) Doing well with weight loss. Continue and monitor  5. OSA (obstructive sleep apnea) continue  6. History of breast cancer Referral to consultant (2/2 relocation) - Ambulatory referral to Hematology / Oncology  7. Right hip pain Much improved with present management. continue   Return in about 6 months (around 12/30/2022) for follow up.   Becky Sax, MD

## 2022-08-18 ENCOUNTER — Other Ambulatory Visit: Payer: Self-pay

## 2022-08-18 ENCOUNTER — Encounter: Payer: Self-pay | Admitting: Hematology and Oncology

## 2022-08-18 ENCOUNTER — Inpatient Hospital Stay: Payer: Medicare Other | Attending: Hematology and Oncology | Admitting: Hematology and Oncology

## 2022-08-18 VITALS — BP 151/70 | HR 105 | Temp 97.9°F | Resp 16 | Ht 64.0 in | Wt 229.4 lb

## 2022-08-18 DIAGNOSIS — M1990 Unspecified osteoarthritis, unspecified site: Secondary | ICD-10-CM | POA: Insufficient documentation

## 2022-08-18 DIAGNOSIS — Z08 Encounter for follow-up examination after completed treatment for malignant neoplasm: Secondary | ICD-10-CM

## 2022-08-18 DIAGNOSIS — Z853 Personal history of malignant neoplasm of breast: Secondary | ICD-10-CM | POA: Diagnosis not present

## 2022-08-18 NOTE — Progress Notes (Unsigned)
[facility] NOTE  Patient Care Team: Dorna Mai, MD as PCP - General (Family Medicine)  CHIEF COMPLAINTS/PURPOSE OF CONSULTATION:  Newly diagnosed breast cancer  HISTORY OF PRESENTING ILLNESS:  Emily Gray 77 y.o. female is here because of recent diagnosis of {left/right:[id number]}   I reviewed her records extensively and collaborated the history with the patient.  SUMMARY OF ONCOLOGIC HISTORY: Oncology History   No history exists.     MEDICAL HISTORY:  Past Medical History:  Diagnosis Date   Acid reflux    Arthritis    Breast cancer ([place])    Hypertension    Obesity, unspecified    Pure hyperglyceridemia     SURGICAL HISTORY: Past Surgical History:  Procedure Laterality Date   APPENDECTOMY     BILATERAL SALPINGOOPHORECTOMY     BREAST LUMPECTOMY Right    CATARACT EXTRACTION Left    EYE SURGERY Left 07/20/2008   macro-hole, filled with gel substance at [facility]  12/03/2004   TONSILLECTOMY     TUBAL LIGATION Bilateral     SOCIAL HISTORY: Social History   Socioeconomic History   Marital status: Married    Spouse name: Not on file   Number of children: Not on file   Years of education: Not on file   Highest education level: Not on file  Occupational History   Not on file  Tobacco Use   Smoking status: Former   Smokeless tobacco: Never  Vaping Use   Vaping Use: Never used  Substance and Sexual Activity   Alcohol use: No   Drug use: No   Sexual activity: Not on file  Other Topics Concern   Not on file  Social History Narrative   Not on file   Social Determinants of Health   Financial Resource Strain: Low Risk  (05/29/2022)   Overall Financial Resource Strain (CARDIA)    Difficulty of Paying Living Expenses: Not hard at all  Food Insecurity: No Food Insecurity (05/29/2022)   Hunger Vital Sign    Worried About Running Out of Food in the Last Year: Never true    [place] in the Last Year: Never  true  Transportation Needs: No Transportation Needs (05/29/2022)   PRAPARE - Hydrologist (Medical): No    Lack of Transportation (Non-Medical): No  Physical Activity: Insufficiently Active (05/29/2022)   Exercise Vital Sign    Days of Exercise per Week: 1 day    Minutes of Exercise per Session: 20 min  Stress: Stress Concern Present (05/29/2022)   [place]    Feeling of Stress : To some extent  Social Connections: Moderately Isolated (05/29/2022)   Social Connection and Isolation Panel [NHANES]    Frequency of Communication with Friends and Family: More than three times a week    Frequency of Social Gatherings with Friends and Family: Once a week    Attends Religious Services: Never    Marine scientist or Organizations: No    Attends Archivist Meetings: Never    Marital Status: Married  Human resources officer Violence: Not At Risk (05/29/2022)   Humiliation, Afraid, Rape, and Kick questionnaire    Fear of Current or Ex-Partner: No    Emotionally Abused: No    Physically Abused: No    Sexually Abused: No    FAMILY HISTORY: Family History  Problem Relation Age of Onset   Hypertension  Mother    Cancer Father    Hypertension Father    Breast cancer Sister     ALLERGIES:  has No Known Allergies.  MEDICATIONS:  Current Outpatient Medications  Medication Sig Dispense Refill   alendronate (FOSAMAX) 10 MG tablet Take by mouth.     aspirin 81 MG tablet Take 81 mg by mouth daily.     atorvastatin (LIPITOR) 20 MG tablet Take 1 tablet (20 mg total) by mouth daily. 90 tablet 1   Blood Glucose Monitoring Suppl (ONETOUCH VERIO REFLECT) w/Device KIT Use to check blood sugar TID. E11.65 1 kit 0   cholecalciferol (VITAMIN D3) 25 MCG (1000 UT) tablet Take 1 tablet (1,000 Units total) by mouth daily. 30 tablet 0   COD LIVER OIL/VITAMINS A & D PO Take by mouth.     Continuous Blood  Gluc Sensor (FREESTYLE LIBRE 2 SENSOR) MISC Utilize as directed q2 weeks to monitor blood glucose - E11.9 6 each 1   furosemide (LASIX) 20 MG tablet Take by mouth.     glucose blood (ONETOUCH VERIO) test strip Use to check blood sugar TID. E11.65 100 each 2   ibuprofen (ADVIL) 600 MG tablet Take 1 tablet (600 mg total) by mouth every 8 (eight) hours as needed. Use sparingly 40 tablet 2   Insulin Pen Needle 32G X 4 MM MISC Use with insulin pen daily. Dx: E11.9 100 each 4   lisinopril-hydrochlorothiazide (ZESTORETIC) 20-12.5 MG tablet Take 2 tablets by mouth daily. 180 tablet 1   Semaglutide, 2 MG/DOSE, 8 MG/3ML SOPN Inject 2 mg as directed once a week. Dx: E11.65 9 mL 1   VITAMIN D PO Take by mouth.     No current facility-administered medications for this visit.    PHYSICAL EXAMINATION: ECOG PERFORMANCE STATUS: 0 - Asymptomatic  Vitals:   08/18/22 0956  BP: (!) 151/70  Pulse: (!) 105  Resp: 16  Temp: 97.9 F (36.6 C)  SpO2: 99%   Filed Weights   08/18/22 0956  Weight: 229 lb 6.4 oz (104.1 kg)    GENERAL:alert, no distress and comfortable SKIN: skin color, texture, turgor are normal, no rashes or significant lesions EYES: normal, conjunctiva are pink and non-injected, sclera clear OROPHARYNX:no exudate, no erythema and lips, buccal mucosa, and tongue normal  NECK: supple, thyroid normal size, non-tender, without nodularity  BREAST:  LABORATORY DATA:  I have reviewed the data as listed Lab Results  Component Value Date   WBC 6.7 08/09/2019   HGB 13.0 08/09/2019   HCT 38.5 08/09/2019   MCV 83 08/09/2019   PLT 267 08/09/2019   Lab Results  Component Value Date   NA 143 03/30/2022   K 4.3 03/30/2022   CL 102 03/30/2022   CO2 25 03/30/2022    RADIOGRAPHIC STUDIES: I have personally reviewed the radiological reports and agreed with the findings in the report.  ASSESSMENT AND PLAN:  No problem-specific Assessment & Plan notes found for this encounter.   All  questions were answered. The patient knows to call the clinic with any problems, questions or concerns.    Benay Pike, MD 08/18/22

## 2022-08-19 ENCOUNTER — Encounter: Payer: Self-pay | Admitting: Hematology and Oncology

## 2022-08-19 ENCOUNTER — Telehealth: Payer: Self-pay | Admitting: *Deleted

## 2022-08-19 NOTE — Telephone Encounter (Signed)
Dr. Chryl Heck saw patient on 08/18/22. Emily Gray was previously treated at [facility] [place] by Dr Jodelle Green, back in 2018 through 2023. Dr. Chryl Heck requested the last 2 notes and pathology report from  Dr. Jodelle Green. Ph [phone]  Contacted their office and per medical records- they only accept written records requests.  The patient gave verbal permission for Dr. Rob Hickman office to request the records on her behalf as she does not drive and can't easily come in to office to sign ROI.   Faxed Request for Records to [facility] @ [phone].

## 2022-08-20 ENCOUNTER — Encounter: Payer: Self-pay | Admitting: Hematology and Oncology

## 2022-08-20 NOTE — Telephone Encounter (Signed)
Records from [facility] received via fax at [facility] and provided to Dr. Chryl Heck

## 2022-10-08 ENCOUNTER — Encounter: Payer: Self-pay | Admitting: Hematology and Oncology

## 2022-10-31 ENCOUNTER — Encounter: Payer: Self-pay | Admitting: Hematology and Oncology

## 2022-10-31 ENCOUNTER — Other Ambulatory Visit: Payer: Self-pay | Admitting: Hematology and Oncology

## 2022-10-31 NOTE — Progress Notes (Signed)
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  Since it has been more than 5 years at diagnosis and very early stage, we have recommended that she continue to follow-up with her primary care physician and continue screening mammograms.  She expressed understanding.  She can return to oncology as needed.

## 2022-11-09 ENCOUNTER — Encounter: Payer: Self-pay | Admitting: Hematology and Oncology

## 2022-11-12 ENCOUNTER — Encounter: Payer: Self-pay | Admitting: Podiatry

## 2022-11-12 ENCOUNTER — Ambulatory Visit (INDEPENDENT_AMBULATORY_CARE_PROVIDER_SITE_OTHER): Payer: Medicare Other | Admitting: Podiatry

## 2022-11-12 DIAGNOSIS — M79675 Pain in left toe(s): Secondary | ICD-10-CM | POA: Diagnosis not present

## 2022-11-12 DIAGNOSIS — M79674 Pain in right toe(s): Secondary | ICD-10-CM

## 2022-11-12 DIAGNOSIS — B351 Tinea unguium: Secondary | ICD-10-CM

## 2022-11-12 NOTE — Progress Notes (Signed)
Subjective:   Patient ID: Emily Gray, female   DOB: 77 y.o.   MRN: [id number]   HPI Patient states she stopped taking the gabapentin because it did not seem to help her numbness and her nailbeds are elongated and painful   ROS      Objective:  Physical Exam  Neuro vascular status unchanged thick yellow brittle nailbeds 1-5 both feet painful     Assessment:  Mycotic nail infection with pain 1-5 both feet     Plan:  Reviewed condition debrided nailbeds 1-5 both feet no angiogenic bleeding noted

## 2022-11-18 ENCOUNTER — Ambulatory Visit (INDEPENDENT_AMBULATORY_CARE_PROVIDER_SITE_OTHER): Payer: Medicare Other | Admitting: Family Medicine

## 2022-11-18 ENCOUNTER — Encounter: Payer: Self-pay | Admitting: Family Medicine

## 2022-11-18 VITALS — BP 144/86 | HR 78 | Temp 98.1°F | Resp 16

## 2022-11-18 DIAGNOSIS — I10 Essential (primary) hypertension: Secondary | ICD-10-CM | POA: Diagnosis not present

## 2022-11-18 DIAGNOSIS — E1165 Type 2 diabetes mellitus with hyperglycemia: Secondary | ICD-10-CM | POA: Diagnosis not present

## 2022-11-18 DIAGNOSIS — Z794 Long term (current) use of insulin: Secondary | ICD-10-CM | POA: Diagnosis not present

## 2022-11-18 DIAGNOSIS — E782 Mixed hyperlipidemia: Secondary | ICD-10-CM | POA: Diagnosis not present

## 2022-11-18 DIAGNOSIS — N3000 Acute cystitis without hematuria: Secondary | ICD-10-CM | POA: Diagnosis not present

## 2022-11-18 LAB — POCT URINALYSIS DIP (CLINITEK)
Bilirubin, UA: NEGATIVE
Glucose, UA: NEGATIVE mg/dL
Ketones, POC UA: NEGATIVE mg/dL
Nitrite, UA: POSITIVE — AB
POC PROTEIN,UA: 100 — AB
Spec Grav, UA: 1.025 (ref 1.010–1.025)
Urobilinogen, UA: 0.2 E.U./dL
pH, UA: 6 (ref 5.0–8.0)

## 2022-11-18 LAB — POCT GLYCOSYLATED HEMOGLOBIN (HGB A1C): Hemoglobin A1C: 6.5 % — AB (ref 4.0–5.6)

## 2022-11-18 MED ORDER — NITROFURANTOIN MONOHYD MACRO 100 MG PO CAPS: 100.0000 mg | ORAL_CAPSULE | Freq: Two times a day (BID) | ORAL | 0 refills | Status: DC

## 2022-11-20 ENCOUNTER — Encounter: Payer: Self-pay | Admitting: Family Medicine

## 2022-11-20 NOTE — Progress Notes (Signed)
Established Patient Office Visit  Subjective    Patient ID: Emily Gray, female    DOB: 1945-10-01  Age: 77 y.o. MRN: [id number]  CC: No chief complaint on file.   HPI Emily Gray presents for routine follow up of chronic med issues. Patient reports that she has been having burning with urination and urinary frequency.    Outpatient Encounter Medications as of 11/18/2022  Medication Sig   alendronate (FOSAMAX) 10 MG tablet Take by mouth.   aspirin 81 MG tablet Take 81 mg by mouth daily.   atorvastatin (LIPITOR) 20 MG tablet Take 1 tablet (20 mg total) by mouth daily.   Blood Glucose Monitoring Suppl (ONETOUCH VERIO REFLECT) w/Device KIT Use to check blood sugar TID. E11.65   cholecalciferol (VITAMIN D3) 25 MCG (1000 UT) tablet Take 1 tablet (1,000 Units total) by mouth daily.   COD LIVER OIL/VITAMINS A & D PO Take by mouth.   Continuous Blood Gluc Sensor (FREESTYLE LIBRE 2 SENSOR) MISC Utilize as directed q2 weeks to monitor blood glucose - E11.9   glucose blood (ONETOUCH VERIO) test strip Use to check blood sugar TID. E11.65   ibuprofen (ADVIL) 600 MG tablet Take 1 tablet (600 mg total) by mouth every 8 (eight) hours as needed. Use sparingly   Insulin Pen Needle 32G X 4 MM MISC Use with insulin pen daily. Dx: E11.9   lisinopril-hydrochlorothiazide (ZESTORETIC) 20-12.5 MG tablet Take 2 tablets by mouth daily.   nitrofurantoin, macrocrystal-monohydrate, (MACROBID) 100 MG capsule Take 1 capsule (100 mg total) by mouth 2 (two) times daily.   Semaglutide, 2 MG/DOSE, 8 MG/3ML SOPN Inject 2 mg as directed once a week. Dx: E11.65   VITAMIN D PO Take by mouth.   No facility-administered encounter medications on file as of 11/18/2022.    Past Medical History:  Diagnosis Date   Acid reflux    Arthritis    Breast cancer (HCC)    Hypertension    Obesity, unspecified    Pure hyperglyceridemia     Past Surgical History:  Procedure Laterality Date   APPENDECTOMY     BILATERAL  SALPINGOOPHORECTOMY     BREAST LUMPECTOMY Right    CATARACT EXTRACTION Left    EYE SURGERY Left 07/20/2008   macro-hole, filled with gel substance at [facility]   HYSTEROSCOPY WITH D & C  12/03/2004   TONSILLECTOMY     TUBAL LIGATION Bilateral     Family History  Problem Relation Age of Onset   Hypertension Mother    Cancer Father    Hypertension Father    Breast cancer Sister     Social History   Socioeconomic History   Marital status: Married    Spouse name: Not on file   Number of children: Not on file   Years of education: Not on file   Highest education level: Not on file  Occupational History   Not on file  Tobacco Use   Smoking status: Former   Smokeless tobacco: Never  Vaping Use   Vaping Use: Never used  Substance and Sexual Activity   Alcohol use: No   Drug use: No   Sexual activity: Not on file  Other Topics Concern   Not on file  Social History Narrative   Not on file   Social Determinants of Health   Financial Resource Strain: Low Risk  (05/29/2022)   Overall Financial Resource Strain (CARDIA)    Difficulty of Paying Living Expenses: Not hard at all  Food Insecurity: No Food  Insecurity (05/29/2022)   Hunger Vital Sign    Worried About Running Out of Food in the Last Year: Never true    Ran Out of Food in the Last Year: Never true  Transportation Needs: No Transportation Needs (05/29/2022)   PRAPARE - Administrator, Civil Service (Medical): No    Lack of Transportation (Non-Medical): No  Physical Activity: Insufficiently Active (05/29/2022)   Exercise Vital Sign    Days of Exercise per Week: 1 day    Minutes of Exercise per Session: 20 min  Stress: Stress Concern Present (05/29/2022)   [employer] of Occupational Health - Occupational Stress Questionnaire    Feeling of Stress : To some extent  Social Connections: Moderately Isolated (05/29/2022)   Social Connection and Isolation Panel [NHANES]    Frequency of Communication with  Friends and Family: More than three times a week    Frequency of Social Gatherings with Friends and Family: Once a week    Attends Religious Services: Never    Database administrator or Organizations: No    Attends Banker Meetings: Never    Marital Status: Married  Catering manager Violence: Not At Risk (05/29/2022)   Humiliation, Afraid, Rape, and Kick questionnaire    Fear of Current or Ex-Partner: No    Emotionally Abused: No    Physically Abused: No    Sexually Abused: No    Review of Systems  Constitutional:  Negative for chills and fever.  Genitourinary:  Positive for dysuria and frequency.  All other systems reviewed and are negative.       Objective    BP (!) 144/86   Pulse 78   Temp 98.1 F (36.7 C) (Oral)   Resp 16   SpO2 96%   Physical Exam Vitals and nursing note reviewed.  Constitutional:      General: She is not in acute distress. Cardiovascular:     Rate and Rhythm: Normal rate and regular rhythm.  Pulmonary:     Effort: Pulmonary effort is normal.     Breath sounds: Normal breath sounds.  Abdominal:     Palpations: Abdomen is soft.     Tenderness: There is no abdominal tenderness.  Neurological:     General: No focal deficit present.     Mental Status: She is alert and oriented to person, place, and time.         Assessment & Plan:   1. Acute cystitis without hematuria Urine for culture. Macrobid prescribed.  - POCT URINALYSIS DIP (CLINITEK) - Urine Culture  2. Type 2 diabetes mellitus with hyperglycemia, with long-term current use of insulin (HCC) Improved A1c and now at goal. continue - POCT glycosylated hemoglobin (Hb A1C)  3. Essential hypertension Slightly elevated readings. Continue   4. Mixed hyperlipidemia Continue     Return in about 6 months (around 05/21/2023) for follow up.   Tommie Raymond, MD

## 2022-11-21 LAB — URINE CULTURE

## 2022-12-21 ENCOUNTER — Ambulatory Visit (HOSPITAL_COMMUNITY)
Admission: EM | Admit: 2022-12-21 | Discharge: 2022-12-21 | Disposition: A | Discharge status: Home / Self Care | Payer: Medicare Other | Attending: Internal Medicine | Admitting: Internal Medicine

## 2022-12-21 ENCOUNTER — Other Ambulatory Visit: Payer: Self-pay

## 2022-12-21 ENCOUNTER — Encounter (HOSPITAL_COMMUNITY): Payer: Self-pay | Admitting: *Deleted

## 2022-12-21 ENCOUNTER — Ambulatory Visit: Payer: Self-pay

## 2022-12-21 DIAGNOSIS — R310 Gross hematuria: Secondary | ICD-10-CM | POA: Insufficient documentation

## 2022-12-21 DIAGNOSIS — R300 Dysuria: Secondary | ICD-10-CM | POA: Diagnosis not present

## 2022-12-21 DIAGNOSIS — E1165 Type 2 diabetes mellitus with hyperglycemia: Secondary | ICD-10-CM

## 2022-12-21 LAB — POCT URINALYSIS DIP (MANUAL ENTRY)
Glucose, UA: 100 mg/dL — AB
Nitrite, UA: NEGATIVE
Protein Ur, POC: >=300 mg/dL — AB
Spec Grav, UA: 1.02 (ref 1.010–1.025)
Urobilinogen, UA: 4 E.U./dL — AB
pH, UA: 5 (ref 5.0–8.0)

## 2022-12-21 LAB — COMPREHENSIVE METABOLIC PANEL
ALT: 15 U/L (ref 0–44)
AST: 19 U/L (ref 15–41)
Albumin: 3.4 g/dL — ABNORMAL LOW (ref 3.5–5.0)
Alkaline Phosphatase: 85 U/L (ref 38–126)
Anion gap: 12 (ref 5–15)
BUN: 21 mg/dL (ref 8–23)
CO2: 22 mmol/L (ref 22–32)
Calcium: 9.1 mg/dL (ref 8.9–10.3)
Chloride: 104 mmol/L (ref 98–111)
Creatinine, Ser: 1.07 mg/dL — ABNORMAL HIGH (ref 0.44–1.00)
GFR, Estimated: 54 mL/min — ABNORMAL LOW (ref >60)
Glucose, Bld: 128 mg/dL — ABNORMAL HIGH (ref 70–99)
Potassium: 3.8 mmol/L (ref 3.5–5.1)
Sodium: 138 mmol/L (ref 135–145)
Total Bilirubin: 0.3 mg/dL (ref 0.3–1.2)
Total Protein: 6.7 g/dL (ref 6.5–8.1)

## 2022-12-21 LAB — CBC WITH DIFFERENTIAL/PLATELET
Abs Immature Granulocytes: 0.02 10*3/uL (ref 0.00–0.07)
Basophils Absolute: 0 10*3/uL (ref 0.0–0.1)
Basophils Relative: 1 %
Eosinophils Absolute: 0.2 10*3/uL (ref 0.0–0.5)
Eosinophils Relative: 4 %
HCT: 34.3 % — ABNORMAL LOW (ref 36.0–46.0)
Hemoglobin: 11 g/dL — ABNORMAL LOW (ref 12.0–15.0)
Immature Granulocytes: 1 %
Lymphocytes Relative: 24 %
Lymphs Abs: 1 10*3/uL (ref 0.7–4.0)
MCH: 28 pg (ref 26.0–34.0)
MCHC: 32.1 g/dL (ref 30.0–36.0)
MCV: 87.3 fL (ref 80.0–100.0)
Monocytes Absolute: 0.3 10*3/uL (ref 0.1–1.0)
Monocytes Relative: 8 %
Neutro Abs: 2.6 10*3/uL (ref 1.7–7.7)
Neutrophils Relative %: 62 %
Platelets: 225 10*3/uL (ref 150–400)
RBC: 3.93 MIL/uL (ref 3.87–5.11)
RDW: 14.9 % (ref 11.5–15.5)
WBC: 4.1 10*3/uL (ref 4.0–10.5)
nRBC: 0 % (ref 0.0–0.2)

## 2022-12-21 MED ORDER — CEFTRIAXONE SODIUM 1 G IJ SOLR
1.0000 g | Freq: Once | INTRAMUSCULAR | Status: AC
  Administered 2022-12-21: 1 g via INTRAMUSCULAR

## 2022-12-21 MED ORDER — STERILE WATER FOR INJECTION IJ SOLN
INTRAMUSCULAR | Status: AC
  Filled 2022-12-21: qty 10

## 2022-12-21 MED ORDER — CEFTRIAXONE SODIUM 1 G IJ SOLR
INTRAMUSCULAR | Status: AC
  Filled 2022-12-21: qty 10

## 2022-12-21 MED ORDER — CEFDINIR 300 MG PO CAPS
300.0000 mg | ORAL_CAPSULE | Freq: Two times a day (BID) | ORAL | 0 refills | Status: DC
  Filled 2022-12-21: qty 20, 10d supply, fill #0

## 2022-12-21 NOTE — ED Provider Notes (Signed)
[facility]   Note:  This document was prepared using Dragon voice recognition software and may include unintentional dictation errors.  MRN: [id number] DOB: Oct 28, 1945 DATE: 12/21/22   Subjective:  Chief Complaint:  Chief Complaint  Patient presents with   Hematuria    HPI: Emily Gray is a 77 y.o. female presenting for gross hematuria for 2 days.  Patient states she was treated for UTI on Nov 18, 2022 with Macrobid.  Per her chart, urine culture was positive for E. coli at that time and sensitive to Macrobid. She states she noticed blood in her urine at that time, but resolved for a short period of time after taking the Macrobid. Since then, she continues to have hematuria intermittently. She became concerned when last night she noticed the most recent episode of hematuria. Reports slight dysuria. She has a history of T2DM currently being treated with Ozempic.  She states her glucose this morning was 110.  Denies fever, nausea/vomiting, abdominal pain, low back pain. Endorses gross hematuria and dysuria. Presents NAD.  Prior to Admission medications   Medication Sig Start Date End Date Taking? Authorizing Provider  atorvastatin (LIPITOR) 20 MG tablet Take 1 tablet (20 mg total) by mouth daily. 06/30/22  Yes Georganna Skeans, MD  Blood Glucose Monitoring Suppl [facility] VERIO REFLECT) w/Device KIT Use to check blood sugar TID. E11.65 08/23/20  Yes Newlin, Enobong, MD  cholecalciferol (VITAMIN D3) 25 MCG (1000 UT) tablet Take 1 tablet (1,000 Units total) by mouth daily. 10/20/18  Yes Calvert Cantor, MD  COD LIVER OIL/VITAMINS A & D PO Take by mouth.   Yes [provider]  Continuous Blood Gluc Sensor (FREESTYLE LIBRE 2 SENSOR) MISC Utilize as directed q2 weeks to monitor blood glucose - E11.9 06/30/22  Yes Georganna Skeans, MD  glucose blood (ONETOUCH VERIO) test strip Use to check blood sugar TID. E11.65 09/05/21  Yes Georganna Skeans, MD  Semaglutide, 2 MG/DOSE, 8 MG/3ML SOPN Inject  2 mg as directed once a week. Dx: E11.65 06/30/22  Yes Georganna Skeans, MD  VITAMIN D PO Take by mouth.   Yes [provider]  alendronate (FOSAMAX) 10 MG tablet Take by mouth.    [provider]  aspirin 81 MG tablet Take 81 mg by mouth daily.    [provider]  ibuprofen (ADVIL) 600 MG tablet Take 1 tablet (600 mg total) by mouth every 8 (eight) hours as needed. Use sparingly 05/16/20   Anders Simmonds, PA-C  Insulin Pen Needle 32G X 4 MM MISC Use with insulin pen daily. Dx: E11.9 11/15/20   Arvilla Market, MD  lisinopril-hydrochlorothiazide (ZESTORETIC) 20-12.5 MG tablet Take 2 tablets by mouth daily. 06/30/22   Georganna Skeans, MD  nitrofurantoin, macrocrystal-monohydrate, (MACROBID) 100 MG capsule Take 1 capsule (100 mg total) by mouth 2 (two) times daily. 11/18/22   Georganna Skeans, MD     No Known Allergies  History:   Past Medical History:  Diagnosis Date   Acid reflux    Arthritis    Breast cancer (HCC)    Hypertension    Obesity, unspecified    Pure hyperglyceridemia      Past Surgical History:  Procedure Laterality Date   APPENDECTOMY     BILATERAL SALPINGOOPHORECTOMY     BREAST LUMPECTOMY Right    CATARACT EXTRACTION Left    EYE SURGERY Left 07/20/2008   macro-hole, filled [facility] at [facility]   HYSTEROSCOPY WITH D & C  12/03/2004   TONSILLECTOMY  TUBAL LIGATION Bilateral     Family History  Problem Relation Age of Onset   Hypertension Mother    Cancer Father    Hypertension Father    Breast cancer Sister     Social History   Tobacco Use   Smoking status: Former   Smokeless tobacco: Never  Building services engineer Use: Never used  Substance Use Topics   Alcohol use: No   Drug use: No    Review of Systems  Constitutional:  Negative for fever.  Gastrointestinal:  Negative for abdominal pain, nausea and vomiting.  Genitourinary:  Positive for difficulty urinating, dysuria and hematuria. Negative for flank  pain, vaginal bleeding, vaginal discharge and vaginal pain.  Musculoskeletal:  Negative for back pain.     Objective:   Vitals: BP 132/76   Pulse 79   Temp 97.9 F (36.6 C)   Resp 18   SpO2 94%   Physical Exam Constitutional:      General: She is not in acute distress.    Appearance: Normal appearance. She is well-developed. She is morbidly obese. She is not ill-appearing or toxic-appearing.     Comments: Patient presents with a walker  HENT:     Head: Normocephalic and atraumatic.  Cardiovascular:     Rate and Rhythm: Normal rate and regular rhythm.     Heart sounds: Normal heart sounds.  Pulmonary:     Effort: Pulmonary effort is normal.     Breath sounds: Normal breath sounds.     Comments: Clear to auscultation bilaterally Abdominal:     General: Bowel sounds are normal.     Palpations: Abdomen is soft.     Tenderness: There is no abdominal tenderness. There is no right CVA tenderness or left CVA tenderness.  Musculoskeletal:     Lumbar back: Normal.  Skin:    General: Skin is warm and dry.  Neurological:     General: No focal deficit present.     Mental Status: She is alert.  Psychiatric:        Mood and Affect: Mood and affect normal.     Results:  Labs: Results for orders placed or performed during the hospital encounter of 12/21/22 (from the past 24 hour(s))  POCT urinalysis dipstick     Status: Abnormal   Collection Time: 12/21/22 10:22 AM  Result Value Ref Range   Color, UA red (A) yellow   Clarity, UA cloudy (A) clear   Glucose, UA =100 (A) negative mg/dL   Bilirubin, UA large (A) negative   Ketones, POC UA small (15) (A) negative mg/dL   Spec Grav, UA 9.629 5.284 - 1.025   Blood, UA large (A) negative   pH, UA 5.0 5.0 - 8.0   Protein Ur, POC >=300 (A) negative mg/dL   Urobilinogen, UA 4.0 (A) 0.2 or 1.0 E.U./dL   Nitrite, UA Negative Negative   Leukocytes, UA Large (3+) (A) Negative    Radiology: No results found.   UC Course/Treatments:   Procedures: Procedures   Medications Ordered in UC: Medications  cefTRIAXone (ROCEPHIN) injection 1 g (1 g Intramuscular Given 12/21/22 1032)     Assessment and Plan :     ICD-10-CM   1. Gross hematuria  R31.0     2. Dysuria  R30.0     3. Type 2 diabetes mellitus with hyperglycemia, unspecified whether long term insulin use (HCC)  E11.65      Gross hematuria Afebrile, nontoxic-appearing, NAD. VSS. DDX includes but not limited  to: Cystitis, pyelonephritis, malignancy, renal injury UA was positive for gross hematuria with large RBCs and large leukocytes today in office.  Urine culture is pending.  CBC and CMP ordered to evaluate for leukocytosis as well as kidney function and liver function.  Cefdinir 300 mg twice daily was prescribed to empirically treat UTI.  Rocephin 1g was given today in office.  Recommend patient follow-up with her PCP as soon as possible discussed referral to urology for further evaluation if no improvement.  Strict ED precautions were given and patient verbalized understanding.  Dysuria See notes above  Type 2 diabetes mellitus with hyperglycemia, unspecified whether long term insulin use (HCC) Previously diagnosed Active.  Glucose this morning was 110 per her continuous glucose monitor.  Continue with Ozempic 2 mg once weekly as directed.  Recommend follow-up with PCP.    ED Discharge Orders          Ordered    cefdinir (OMNICEF) 300 MG capsule  2 times daily        12/21/22 1115             PDMP not reviewed this encounter.     Cynda Acres, PA-C 12/21/22 1115

## 2022-12-21 NOTE — ED Triage Notes (Signed)
Pt has had blood in urine since Thursday night. Pt was treated 11-18-22 for UTI by PCP. Pt ook nitrofurantoin 100 mg . Pt reports Sx's did not change after completing Anti-bx.

## 2022-12-21 NOTE — Telephone Encounter (Signed)
  Chief Complaint: Blood in urine - hesitancy - pain with urination Symptoms: above Frequency: Friday Pertinent Negatives: Patient denies  Disposition: [] ED /[x] Urgent Care (no appt availability in office) / [] Appointment(In office/virtual)/ []  [facility]/ [] Home Care/ [] Refused Recommended Disposition /[] [facility]/ []  Follow-up with PCP Additional Notes: Pt states that s/s started Friday. Pt states that there is blood in her urine, frequency and hesitation. No appts in office - pt will go to UC.    Reason for Disposition  Pain or burning with passing urine  Answer Assessment - Initial Assessment Questions 1. COLOR of URINE: "Describe the color of the urine."  (e.g., tea-colored, pink, red, bloody) "Do you have blood clots in your urine?" (e.g., none, pea, grape, small coin)     red 2. ONSET: "When did the bleeding start?"      Friday morning 3. EPISODES: "How many times has there been blood in the urine?" or "How many times today?"     Stopped and started 4. PAIN with URINATION: "Is there any pain with passing your urine?" If Yes, ask: "How bad is the pain?"  (Scale 1-10; or mild, moderate, severe)    - MILD: Complains slightly about urination hurting.    - MODERATE: Interferes with normal activities.      - SEVERE: Excruciating, unwilling or unable to urinate because of the pain.      Did yesterday  - had pain below her naval. 5. FEVER: "Do you have a fever?" If Yes, ask: "What is your temperature, how was it measured, and when did it start?"     no 6. ASSOCIATED SYMPTOMS: "Are you passing urine more frequently than usual?"     Yes 7. OTHER SYMPTOMS: "Do you have any other symptoms?" (e.g., back/flank pain, abdomen pain, vomiting)     Difficulty starting urination  Protocols used: Urine - Blood In-A-AH

## 2022-12-21 NOTE — Discharge Instructions (Signed)
Your urine sample was sent to the lab for further testing. You were prescribed Cefdinir which is an antibiotic that is often used to treat urinary tract infections. Take the prescription as directed. A urine culture has been sent to the lab for further testing.  We will call you with those results.  If the prescription needs to be changed, it will be done so at that time. I have also sent your blood work to the lab for further testing as well.   I recommend you follow up with your PCP as soon as possible and discuss possible referral to urology.  Return in 2 to 3 days if no improvement. Please go directly to the Emergency Department immediately should you begin to have any of the following symptoms: persistent fevers, increased pain or persistent nausea/vomiting.\

## 2022-12-22 LAB — URINE CULTURE: Culture: >=100000 — AB

## 2022-12-23 ENCOUNTER — Telehealth (HOSPITAL_COMMUNITY): Payer: Self-pay | Admitting: Emergency Medicine

## 2022-12-23 LAB — URINE CULTURE

## 2022-12-23 MED ORDER — CEFDINIR 300 MG PO CAPS: 300.0000 mg | ORAL_CAPSULE | Freq: Two times a day (BID) | ORAL | 0 refills | Status: AC

## 2022-12-23 NOTE — Telephone Encounter (Signed)
Reviewed recent results with patient.  She also states she did not receive an abx.  Upon chart review, we sent it to a different pharmacy than she was expecting.  Resent, and return precautions reviewed

## 2022-12-24 ENCOUNTER — Other Ambulatory Visit: Payer: Self-pay

## 2022-12-24 ENCOUNTER — Telehealth: Payer: Self-pay | Admitting: *Deleted

## 2022-12-24 NOTE — Telephone Encounter (Signed)
Call place to patient and results form ER was given. Patient decline making f/up appt  but will keep her appt for November 2024  Copied from CRM #[id number]. Topic: General - Other >> Dec 22, 2022  2:46 PM Ja-Kwan M wrote: Reason for CRM: Pt called for most recent lab results. Pt requests call back asap. Cb# [phone]

## 2022-12-28 NOTE — Telephone Encounter (Signed)
COMPLETE

## 2023-01-04 ENCOUNTER — Encounter: Payer: Self-pay | Admitting: Family Medicine

## 2023-01-04 ENCOUNTER — Ambulatory Visit (INDEPENDENT_AMBULATORY_CARE_PROVIDER_SITE_OTHER): Payer: Medicare Other | Admitting: Family Medicine

## 2023-01-04 VITALS — BP 132/74 | HR 83 | Temp 98.1°F | Resp 16 | Wt 229.2 lb

## 2023-01-04 DIAGNOSIS — R310 Gross hematuria: Secondary | ICD-10-CM | POA: Diagnosis not present

## 2023-01-04 DIAGNOSIS — I10 Essential (primary) hypertension: Secondary | ICD-10-CM

## 2023-01-04 DIAGNOSIS — M1990 Unspecified osteoarthritis, unspecified site: Secondary | ICD-10-CM | POA: Insufficient documentation

## 2023-01-04 DIAGNOSIS — Z794 Long term (current) use of insulin: Secondary | ICD-10-CM

## 2023-01-04 DIAGNOSIS — C50919 Malignant neoplasm of unspecified site of unspecified female breast: Secondary | ICD-10-CM | POA: Insufficient documentation

## 2023-01-04 DIAGNOSIS — G4733 Obstructive sleep apnea (adult) (pediatric): Secondary | ICD-10-CM | POA: Insufficient documentation

## 2023-01-04 DIAGNOSIS — E1165 Type 2 diabetes mellitus with hyperglycemia: Secondary | ICD-10-CM

## 2023-01-04 DIAGNOSIS — Z710 Person encountering health services to consult on behalf of another person: Secondary | ICD-10-CM | POA: Insufficient documentation

## 2023-01-04 NOTE — Progress Notes (Signed)
Established Patient Office Visit  Subjective    Patient ID: Emily Gray, female    DOB: February 26, 1946  Age: 77 y.o. MRN: [id number]  CC:  Chief Complaint  Patient presents with   Follow-up    6 month    HPI Ixayana Dransfield presents for follow up of recent ED visit for blood in urine. She reports that her sx have improved since tx with abx.       Past Medical History:  Diagnosis Date   Acid reflux    Arthritis    Breast cancer (HCC)    Hypertension    Obesity, unspecified    Pure hyperglyceridemia     Past Surgical History:  Procedure Laterality Date   APPENDECTOMY     BILATERAL SALPINGOOPHORECTOMY     BREAST LUMPECTOMY Right    CATARACT EXTRACTION Left    EYE SURGERY Left 07/20/2008   macro-hole, filled with gel substance at [facility]   HYSTEROSCOPY WITH D & C  12/03/2004   TONSILLECTOMY     TUBAL LIGATION Bilateral     Family History  Problem Relation Age of Onset   Hypertension Mother    Cancer Father    Hypertension Father    Breast cancer Sister     Social History   Socioeconomic History   Marital status: Married    Spouse name: Not on file   Number of children: Not on file   Years of education: Not on file   Highest education level: Not on file  Occupational History   Not on file  Tobacco Use   Smoking status: Former   Smokeless tobacco: Never  Vaping Use   Vaping status: Never Used  Substance and Sexual Activity   Alcohol use: No   Drug use: No   Sexual activity: Not on file  Other Topics Concern   Not on file  Social History Narrative   Not on file   Social Determinants of Health   Financial Resource Strain: Low Risk  (05/29/2022)   Overall Financial Resource Strain (CARDIA)    Difficulty of Paying Living Expenses: Not hard at all  Food Insecurity: No Food Insecurity (05/29/2022)   Hunger Vital Sign    Worried About Running Out of Food in the Last Year: Never true    Ran Out of Food in the Last Year: Never true  Transportation Needs: No  Transportation Needs (05/29/2022)   PRAPARE - Administrator, Civil Service (Medical): No    Lack of Transportation (Non-Medical): No  Physical Activity: Insufficiently Active (05/29/2022)   Exercise Vital Sign    Days of Exercise per Week: 1 day    Minutes of Exercise per Session: 20 min  Stress: Stress Concern Present (05/29/2022)   [employer] of Occupational Health - Occupational Stress Questionnaire    Feeling of Stress : To some extent  Social Connections: Moderately Isolated (05/29/2022)   Social Connection and Isolation Panel [NHANES]    Frequency of Communication with Friends and Family: More than three times a week    Frequency of Social Gatherings with Friends and Family: Once a week    Attends Religious Services: Never    Database administrator or Organizations: No    Attends Banker Meetings: Never    Marital Status: Married  Catering manager Violence: Not At Risk (05/29/2022)   Humiliation, Afraid, Rape, and Kick questionnaire    Fear of Current or Ex-Partner: No    Emotionally Abused: No    Physically  Abused: No    Sexually Abused: No    Review of Systems  All other systems reviewed and are negative.       Objective    BP 132/74   Pulse 83   Temp 98.1 F (36.7 C) (Oral)   Resp 16   Wt 229 lb 3.2 oz (104 kg)   SpO2 99%   BMI 39.34 kg/m   Physical Exam Vitals and nursing note reviewed.  Constitutional:      General: She is not in acute distress. Cardiovascular:     Rate and Rhythm: Normal rate and regular rhythm.  Pulmonary:     Effort: Pulmonary effort is normal.     Breath sounds: Normal breath sounds.  Musculoskeletal:     Comments: Patient utilizing cane for stability  Neurological:     General: No focal deficit present.     Mental Status: She is alert and oriented to person, place, and time.         Assessment & Plan:  1. Type 2 diabetes mellitus with hyperglycemia, with long-term current use of insulin  (HCC) Recent A1c is at goal. Continue  - HM DIABETES FOOT EXAM - Urine microalbumin-creatinine with uACR  2. Gross hematuria Improving/resolved. Consider Urology consult if sx recur/persist. Recommend adequate hydration  3. Primary hypertension Appears stable. Continue    No follow-ups on file.   Tommie Raymond, MD

## 2023-01-04 NOTE — Progress Notes (Signed)
Patient is here for their 6 month follow-up Patient has no concerns today Care gaps have been discussed with patient  

## 2023-01-05 ENCOUNTER — Encounter: Payer: Self-pay | Admitting: Family Medicine

## 2023-01-06 ENCOUNTER — Encounter: Payer: Self-pay | Admitting: Family Medicine

## 2023-01-08 ENCOUNTER — Telehealth: Payer: Self-pay | Admitting: *Deleted

## 2023-01-08 ENCOUNTER — Other Ambulatory Visit: Payer: Self-pay | Admitting: Family Medicine

## 2023-01-08 DIAGNOSIS — Z1231 Encounter for screening mammogram for malignant neoplasm of breast: Secondary | ICD-10-CM

## 2023-01-08 NOTE — Telephone Encounter (Signed)
Requested medication (s) are due for refill today: yes  Requested medication (s) are on the active medication list: yes  Last refill:  03/30/22  Future visit scheduled: yes  Notes to clinic:  Unable to refill per protocol, last refill by historical provider, routing for review.     Requested Prescriptions  Pending Prescriptions Disp Refills   alendronate (FOSAMAX) 10 MG tablet      Sig: Take by mouth.     Endocrinology:  Bisphosphonates Failed - 01/08/2023 12:00 PM      Failed - Vitamin D in normal range and within 360 days    No results found for: "ZO1096EA5", "WU9811BJ4", "VD125OH2TOT", "25OHVITD3", "25OHVITD2", "25OHVITD1", "VD25OH"       Failed - Cr in normal range and within 360 days    Creatinine, Ser  Date Value Ref Range Status  12/21/2022 1.07 (H) 0.44 - 1.00 mg/dL Final         Failed - Mg Level in normal range and within 360 days    Magnesium  Date Value Ref Range Status  10/18/2018 2.1 1.7 - 2.4 mg/dL Final    Comment:    Performed at [facility], [address], [place], [place] [address]         Failed - Phosphate in normal range and within 360 days    Phosphorus  Date Value Ref Range Status  10/18/2018 2.6 2.5 - 4.6 mg/dL Final    Comment:    Performed at [facility], [address], [place], [place] [address]         Failed - Bone Mineral Density or Dexa Scan completed in the last 2 years      Passed - Ca in normal range and within 360 days    Calcium  Date Value Ref Range Status  12/21/2022 9.1 8.9 - 10.3 mg/dL Final         Passed - eGFR is 30 or above and within 360 days    GFR calc Af Amer  Date Value Ref Range Status  08/08/2020 65 >59 mL/min/1.73 Final    Comment:    **In accordance with recommendations from the NKF-ASN Task force,**   [facility] is in the process of updating its eGFR calculation to the   2021 CKD-EPI creatinine equation that estimates kidney function   without a race variable.    GFR, Estimated  Date  Value Ref Range Status  12/21/2022 54 (L) >60 mL/min Final    Comment:    (NOTE) Calculated using the CKD-EPI Creatinine Equation (2021)    eGFR  Date Value Ref Range Status  03/30/2022 77 >59 mL/min/1.73 Final         Passed - Valid encounter within last 12 months    Recent Outpatient Visits           4 days ago Type 2 diabetes mellitus with hyperglycemia, with long-term current use of insulin (HCC)   [place] [facility], MD   1 month ago Acute cystitis without hematuria   [place] [facility] at [facility], Lauris Poag, MD   6 months ago Type 2 diabetes mellitus with hyperglycemia, with long-term current use of insulin [facility])   [place] [facility] at [facility], Lauris Poag, MD   6 months ago Type 2 diabetes mellitus with hyperglycemia, with long-term current use of insulin [facility])   [facility] [place], Cornelius Moras, RPH-CPP  7 months ago Right hip pain   [place] [facility] at [facility], MD       Future Appointments             In 4 months Georganna Skeans, MD [facility] at [facility]

## 2023-01-08 NOTE — Telephone Encounter (Unsigned)
Copied from CRM [id number]. Topic: General - Other >> Jan 08, 2023 10:19 AM Everette C wrote: Reason for CRM: Medication Refill - Medication: alendronate (FOSAMAX) 10 MG tablet [[id number]]  Has the patient contacted their pharmacy? Yes.   (Agent: If no, request that the patient contact the pharmacy for the refill. If patient does not wish to contact the pharmacy document the reason why and proceed with request.) (Agent: If yes, when and what did the pharmacy advise?)  Preferred Pharmacy (with phone number or street name):[facility] - [place], [place] - [address] [facility] [address] [place] [address] Phone: [phone] Fax: [phone] Hours: Not open 24 hours   Has the patient been seen for an appointment in the last year OR does the patient have an upcoming appointment? Yes.    Agent: Please be advised that RX refills may take up to 3 business days. We ask that you follow-up with your pharmacy.

## 2023-01-08 NOTE — Telephone Encounter (Signed)
Patient request a referral for mammogram

## 2023-01-11 ENCOUNTER — Ambulatory Visit
Admission: RE | Admit: 2023-01-11 | Discharge: 2023-01-11 | Disposition: A | Discharge status: Home / Self Care | Payer: Medicare Other | Source: Ambulatory Visit | Attending: Family Medicine | Admitting: Family Medicine

## 2023-01-11 ENCOUNTER — Ambulatory Visit: Payer: Medicare Other

## 2023-01-11 DIAGNOSIS — Z1231 Encounter for screening mammogram for malignant neoplasm of breast: Secondary | ICD-10-CM

## 2023-02-01 ENCOUNTER — Ambulatory Visit: Payer: Self-pay | Admitting: *Deleted

## 2023-02-01 NOTE — Telephone Encounter (Signed)
Message from [place] S sent at 02/01/2023  8:42 AM EDT  Summary: Bruising on arm   The patient called in stating her arm is bruised around the area where she has her Continuous Blood Gluc Sensor (FREESTYLE LIBRE 2 SENSOR) MISC. She states this is not the first time it has happened she just didn't pay too much mind to it the first time. She is supposed to change it this Friday as she is supposed to change it every 2 weeks. Please assist her further as she is not sure what to do at this point          Call History  Contact Date/Time Type Contact Phone/Fax User  02/01/2023 08:39 AM EDT Phone (Incoming) Yvonne Kendall (Self) [phone] Judie Petit) Kandis Cocking   Reason for Disposition  Minor bruise    Bruise at the site of her glucose sensor  Answer Assessment - Initial Assessment Questions 1. APPEARANCE of BRUISE: "Describe the bruise."      Freestyle Libre 2 Sensor has bruising around it.    I'm not on any blood thinners.   This is the 2nd time this has happened.   It does not happen every time I have a new sensor put on but I just didn't know if it was anything I needed to worry about.    No soreness or pain at the site of the sensor.     It's dark colored.    The bruise is smaller than when I first put it in.     I tend to bruise easily anyway.   It's not hurting.    I have a neighbor who has been to nursing school that comes over every 14 days and she changes my sensor for me and does my husband's sensor too. 2. SIZE: "How large is the bruise?"      It's just around where the glucose sensor is located.   The bruise gets smaller with time after the sensor is inserted. 3. NUMBER: "How many bruises are there?"      Just the one at the site of my glucose sensor. 4. LOCATION: "Where is the bruise located?"      My upper arm 5. ONSET: "How long ago did the bruise occur?"      When my sensor was changed.    It is changed every 14 days. 6. CAUSE: "Tell me how it happened."     Insertion of my  glucose sensor. 7. MEDICAL HISTORY: "Do you have any medical problems that can cause easy bruising or bleeding?" (e.g., leukemia, liver disease, recent chemotherapy)     I have diabetes.    I'm not on blood thinners including an aspirin a day but I do bruise easily. 8. MEDICINES: "Do you take any medications which thin the blood such as: aspirin, heparin, ibuprofen (NSAIDS), Plavix, or Coumadin?"     No 9. OTHER SYMPTOMS: "Do you have any other symptoms?"  (e.g., weakness, dizziness, pain, fever, nosebleed, blood in urine/stool)     Not asked 10. PREGNANCY: "Is there any chance you are pregnant?" "When was your last menstrual period?"       N/A due to age  Protocols used: Bruises-A-AH

## 2023-02-01 NOTE — Telephone Encounter (Signed)
  Chief Complaint: bruised at [facility] 2 sensor site Symptoms: Bruise at site.    Frequency: 2 time it's happened after changing her sensor site.    "I bruise so easily anyway".    It does not bruise every time I have it changed. Pertinent Negatives: Patient denies soreness, redness, a knot or pain at the site.   The bruise is getting smaller. Disposition: [] ED /[] Urgent Care (no appt availability in office) / [] Appointment(In office/virtual)/ []  [place] [facility]/ [x] Home Care/ [] Refused Recommended Disposition /[] [place] [facility]/ []  Follow-up with PCP Additional Notes: Home care advice given.   Questions answered and encouraged to call back if needed.   Pt. Agreeable to this plan.

## 2023-02-04 ENCOUNTER — Other Ambulatory Visit: Payer: Self-pay | Admitting: Family Medicine

## 2023-02-04 NOTE — Telephone Encounter (Signed)
Medication Refill - Medication: OZEMPIC, alendronate (FOSAMAX) 10 MG Continuous Blood Gluc Sensor (FREESTYLE LIBRE 2 SENSOR) MISC   Has the patient contacted their pharmacy? Yes.    (Agent: If yes, when and what did the pharmacy advise?) Contact PCP   Preferred Pharmacy (with phone number or street name): [facility] - [place], [place] - [address] [facility]   Has the patient been seen for an appointment in the last year OR does the patient have an upcoming appointment? Yes.    Agent: Please be advised that RX refills may take up to 3 business days. We ask that you follow-up with your pharmacy.

## 2023-02-05 ENCOUNTER — Telehealth: Payer: Self-pay | Admitting: Family Medicine

## 2023-02-05 MED ORDER — FREESTYLE LIBRE 2 SENSOR MISC: 1 refills | Status: AC

## 2023-02-05 MED ORDER — ALENDRONATE SODIUM 10 MG PO TABS: 10.0000 mg | ORAL_TABLET | Freq: Every day | ORAL | 0 refills | Status: DC

## 2023-02-05 NOTE — Telephone Encounter (Signed)
Requested medications are due for refill today.  unsure  Requested medications are on the active medications list.  yes  Last refill. historical  Future visit scheduled.   yes  Notes to clinic.  Medication is historical    Requested Prescriptions  Pending Prescriptions Disp Refills   alendronate (FOSAMAX) 10 MG tablet      Sig: Take by mouth.     Endocrinology:  Bisphosphonates Failed - 02/04/2023 10:31 AM      Failed - Vitamin D in normal range and within 360 days    No results found for: "ZO1096EA5", "WU9811BJ4", "VD125OH2TOT", "25OHVITD3", "25OHVITD2", "25OHVITD1", "VD25OH"       Failed - Cr in normal range and within 360 days    Creatinine, Ser  Date Value Ref Range Status  12/21/2022 1.07 (H) 0.44 - 1.00 mg/dL Final         Failed - Mg Level in normal range and within 360 days    Magnesium  Date Value Ref Range Status  10/18/2018 2.1 1.7 - 2.4 mg/dL Final    Comment:    Performed at [facility], [address] [place], [place] [address]         Failed - Phosphate in normal range and within 360 days    Phosphorus  Date Value Ref Range Status  10/18/2018 2.6 2.5 - 4.6 mg/dL Final    Comment:    Performed at [facility], [address] [place], [place] [address]         Failed - Bone Mineral Density or Dexa Scan completed in the last 2 years      Passed - Ca in normal range and within 360 days    Calcium  Date Value Ref Range Status  12/21/2022 9.1 8.9 - 10.3 mg/dL Final         Passed - eGFR is 30 or above and within 360 days    GFR calc Af Amer  Date Value Ref Range Status  08/08/2020 65 >59 mL/min/1.73 Final    Comment:    **In accordance with recommendations from the NKF-ASN Task force,**   [facility] is in the process of updating its eGFR calculation to the   2021 CKD-EPI creatinine equation that estimates kidney function   without a race variable.    GFR, Estimated  Date Value Ref Range Status  12/21/2022 54 (L) >60 mL/min Final     Comment:    (NOTE) Calculated using the CKD-EPI Creatinine Equation (2021)    eGFR  Date Value Ref Range Status  03/30/2022 77 >59 mL/min/1.73 Final         Passed - Valid encounter within last 12 months    Recent Outpatient Visits           1 month ago Type 2 diabetes mellitus with hyperglycemia, with long-term current use of insulin (HCC)   [place] [facility], MD   2 months ago Acute cystitis without hematuria   [place] [facility] at [facility], Lauris Poag, MD   7 months ago Type 2 diabetes mellitus with hyperglycemia, with long-term current use of insulin [facility])   [place] [facility] at [facility], Lauris Poag, MD   7 months ago Type 2 diabetes mellitus with hyperglycemia, with long-term current use of insulin [facility])   [facility] [place], Stephan L, RPH-CPP   8 months ago Right hip pain   [place]  [facility] Care at [facility], MD       Future Appointments             In 3 months Georganna Skeans, MD [facility] at [facility]            Signed Prescriptions Disp Refills   Continuous Glucose Sensor (FREESTYLE LIBRE 2 SENSOR) MISC 6 each 1    Sig: Utilize as directed q2 weeks to monitor blood glucose - E11.9     Endocrinology: Diabetes - Testing Supplies Passed - 02/04/2023 10:31 AM      Passed - Valid encounter within last 12 months    Recent Outpatient Visits           1 month ago Type 2 diabetes mellitus with hyperglycemia, with long-term current use of insulin (HCC)   [place] [facility] at [facility], MD   2 months ago Acute cystitis without hematuria   [place] [facility] at [facility], Lauris Poag, MD   7 months ago Type 2 diabetes mellitus with hyperglycemia, with long-term current use of insulin [facility])   [place] [facility] at [facility], Lauris Poag, MD   7 months ago Type 2  diabetes mellitus with hyperglycemia, with long-term current use of insulin [facility])   [facility] [place], Colbert L, RPH-CPP   8 months ago Right hip pain   [place] [facility] at [facility], MD       Future Appointments             In 3 months Georganna Skeans, MD [facility]

## 2023-02-05 NOTE — Telephone Encounter (Signed)
[facility] called and stated pt is concerned about bruising around freestyle libre cgm and pt and pt's home-aid would like confirmation from nurse if its okay to change it out for a new one today.

## 2023-02-05 NOTE — Telephone Encounter (Signed)
Requested Prescriptions  Pending Prescriptions Disp Refills   alendronate (FOSAMAX) 10 MG tablet      Sig: Take by mouth.     Endocrinology:  Bisphosphonates Failed - 02/04/2023 10:31 AM      Failed - Vitamin D in normal range and within 360 days    No results found for: "NG2952WU1", "LK4401UU7", "VD125OH2TOT", "25OHVITD3", "25OHVITD2", "25OHVITD1", "VD25OH"       Failed - Cr in normal range and within 360 days    Creatinine, Ser  Date Value Ref Range Status  12/21/2022 1.07 (H) 0.44 - 1.00 mg/dL Final         Failed - Mg Level in normal range and within 360 days    Magnesium  Date Value Ref Range Status  10/18/2018 2.1 1.7 - 2.4 mg/dL Final    Comment:    Performed at [facility], [address], [place], [place] [address]         Failed - Phosphate in normal range and within 360 days    Phosphorus  Date Value Ref Range Status  10/18/2018 2.6 2.5 - 4.6 mg/dL Final    Comment:    Performed at [facility], [address] [place], [place] [address]         Failed - Bone Mineral Density or Dexa Scan completed in the last 2 years      Passed - Ca in normal range and within 360 days    Calcium  Date Value Ref Range Status  12/21/2022 9.1 8.9 - 10.3 mg/dL Final         Passed - eGFR is 30 or above and within 360 days    GFR calc Af Amer  Date Value Ref Range Status  08/08/2020 65 >59 mL/min/1.73 Final    Comment:    **In accordance with recommendations from the NKF-ASN Task force,**   [facility] is in the process of updating its eGFR calculation to the   2021 CKD-EPI creatinine equation that estimates kidney function   without a race variable.    GFR, Estimated  Date Value Ref Range Status  12/21/2022 54 (L) >60 mL/min Final    Comment:    (NOTE) Calculated using the CKD-EPI Creatinine Equation (2021)    eGFR  Date Value Ref Range Status  03/30/2022 77 >59 mL/min/1.73 Final         Passed - Valid encounter within last 12 months    Recent  Outpatient Visits           1 month ago Type 2 diabetes mellitus with hyperglycemia, with long-term current use of insulin (HCC)   [place] [facility] at [facility], MD   2 months ago Acute cystitis without hematuria   [place] [facility], Lauris Poag, MD   7 months ago Type 2 diabetes mellitus with hyperglycemia, with long-term current use of insulin [facility])   [place] [facility] at [facility], Lauris Poag, MD   7 months ago Type 2 diabetes mellitus with hyperglycemia, with long-term current use of insulin [facility])   [facility] [place], [place] L, RPH-CPP   8 months ago Right hip pain   [place] [facility] at [facility], MD       Future Appointments             In 3 months Georganna Skeans, MD [facility] at [facility]  Continuous Glucose Sensor (FREESTYLE LIBRE 2 SENSOR) MISC 6 each 1    Sig: Utilize as directed q2 weeks to monitor blood glucose - E11.9     Endocrinology: Diabetes - Testing Supplies Passed - 02/04/2023 10:31 AM      Passed - Valid encounter within last 12 months    Recent Outpatient Visits           1 month ago Type 2 diabetes mellitus with hyperglycemia, with long-term current use of insulin (HCC)   [place] [facility] at [facility], MD   2 months ago Acute cystitis without hematuria   [place] [facility] at [facility], Lauris Poag, MD   7 months ago Type 2 diabetes mellitus with hyperglycemia, with long-term current use of insulin [facility])   [place] [facility] at [facility], Lauris Poag, MD   7 months ago Type 2 diabetes mellitus with hyperglycemia, with long-term current use of insulin [facility])   [facility] [place], [place] L, RPH-CPP   8 months ago Right hip pain   [place] [facility] at [facility], MD        Future Appointments             In 3 months Georganna Skeans, MD [facility] Primary [facility] at [facility]

## 2023-02-09 ENCOUNTER — Ambulatory Visit: Payer: Self-pay | Admitting: *Deleted

## 2023-02-09 NOTE — Telephone Encounter (Signed)
I spoke with patient and she is going to take a break from machine and go back to finger sticks until her appointment in November

## 2023-02-09 NOTE — Telephone Encounter (Signed)
  Chief Complaint: She called Friday but did not get a response from Dr. Andrey Campanile.   When she inserts her Freestyle Libre it bruises.   She has gone back to sticking her fingers which she doesn't like doing.   She's wanting to know why it's bruising and is this something she needs to  be worried about? Symptoms: Bruising around the [facility] site. Frequency: Not every time she uses it but often enough it is concerning her.   She takes an 81 mg aspirin every other day.    Pertinent Negatives: Patient denies having problems with her glucose Disposition: [] ED /[] Urgent Care (no appt availability in office) / [] Appointment(In office/virtual)/ []  [place] [facility]/ [] Home Care/ [] Refused Recommended Disposition /[] [place] [facility]/ [x]  Follow-up with PCP Additional Notes: See triage notes and advise pt.

## 2023-02-09 NOTE — Telephone Encounter (Signed)
Reason for Disposition  [1] Caller has NON-URGENT medicine question about med that PCP prescribed AND [2] triager unable to answer question  Answer Assessment - Initial Assessment Questions 1. NAME of MEDICINE: "What medicine(s) are you calling about?"     Friday was when I supposed to change my [facility] but it was bruised.   Should I change it or stick my fingers instead.   I can tell when my sugar is high.   It stays 112-113 my glucose.     If it goes to 200 I drink water and it brings it down. After I put it in it bruises around the [facility].      2. QUESTION: "What is your question?" (e.g., double dose of medicine, side effect)     Does she want me to stick my fingers or use the Freestyle [place]?    I don't have a Freestyle on now.     I bruise easy.   I started taking aspirin every other day again.   The aspirin is 81 mg.    3. PRESCRIBER: "Who prescribed the medicine?" Reason: if prescribed by specialist, call should be referred to that group.     Dr. Andrey Campanile 4. SYMPTOMS: "Do you have any symptoms?" If Yes, ask: "What symptoms are you having?"  "How bad are the symptoms (e.g., mild, moderate, severe)     It doesn't bruise every time I put the Freestyle in but it should not do that should it?   I'm sticking my fingers.   Someone else has been putting it on for me.    My niece is coming over tomorrow so I'm going to get her to help me with the [facility].   Can I put it on my stomach the Freestyle Lib[place]?    I give my Ozempic in my stomach.    5. PREGNANCY:  "Is there any chance that you are pregnant?" "When was your last menstrual period?"     N/A  Protocols used: Medication Question Call-A-AH

## 2023-02-15 NOTE — Telephone Encounter (Signed)
I spoke with patient and she is going back to sticking her finger until next appt with pcp

## 2023-02-26 ENCOUNTER — Other Ambulatory Visit: Payer: Self-pay | Admitting: Family Medicine

## 2023-02-26 NOTE — Telephone Encounter (Signed)
Copied from CRM [id number]. Topic: General - Other >> Feb 26, 2023 10:40 AM Everette C wrote: Reason for CRM: Medication Refill - Medication: insulin aspart (NOVOLOG FLEXPEN) 100 UNIT/ML FlexPen [[id number]]  Has the patient contacted their pharmacy? Yes.   (Agent: If no, request that the patient contact the pharmacy for the refill. If patient does not wish to contact the pharmacy document the reason why and proceed with request.) (Agent: If yes, when and what did the pharmacy advise?)  Preferred Pharmacy (with phone number or street name): [facility] - [place], [place] - [address] [place] [address]KVQQV: [phone] Fax: [phone]: Not open 24 hours   Has the patient been seen for an appointment in the last year OR does the patient have an upcoming appointment? Yes.    Agent: Please be advised that RX refills may take up to 3 business days. We ask that you follow-up with your pharmacy.

## 2023-03-01 NOTE — Telephone Encounter (Signed)
Requested medication (s) are due for refill today: Yes  Requested medication (s) are on the active medication list: Yes  Last refill:  01/04/23  Future visit scheduled: Yes  Notes to clinic:  Historical provider.    Requested Prescriptions  Pending Prescriptions Disp Refills   insulin aspart (NOVOLOG FLEXPEN) 100 UNIT/ML FlexPen 15 mL      Endocrinology:  Diabetes - Insulins Passed - 02/26/2023 10:52 AM      Passed - HBA1C is between 0 and 7.9 and within 180 days    Hemoglobin A1C  Date Value Ref Range Status  11/18/2022 6.5 (A) 4.0 - 5.6 % Final   Hgb A1c MFr Bld  Date Value Ref Range Status  03/30/2022 7.3 (H) 4.8 - 5.6 % Final    Comment:             Prediabetes: 5.7 - 6.4          Diabetes: >6.4          Glycemic control for adults with diabetes: <7.0          Passed - Valid encounter within last 6 months    Recent Outpatient Visits           1 month ago Type 2 diabetes mellitus with hyperglycemia, with long-term current use of insulin (HCC)   [place] [facility], MD   3 months ago Acute cystitis without hematuria   [place] [facility], Lauris Poag, MD   8 months ago Type 2 diabetes mellitus with hyperglycemia, with long-term current use of insulin [facility])   [place] [facility] at [facility], Lauris Poag, MD   8 months ago Type 2 diabetes mellitus with hyperglycemia, with long-term current use of insulin [facility])   [facility] [place], [place] L, RPH-CPP   9 months ago Right hip pain    [facility] at [facility], MD       Future Appointments             In 2 months Georganna Skeans, MD [facility] at [facility]

## 2023-03-11 ENCOUNTER — Other Ambulatory Visit: Payer: Self-pay | Admitting: Family Medicine

## 2023-03-11 DIAGNOSIS — E1165 Type 2 diabetes mellitus with hyperglycemia: Secondary | ICD-10-CM

## 2023-03-11 NOTE — Telephone Encounter (Signed)
Medication Refill - Medication: Semaglutide, 2 MG/DOSE, 8 MG/3ML SOPN   Has the patient contacted their pharmacy? No.   Preferred Pharmacy (with phone number or street name):  [facility] - [place], [place] - [address] Phone: [phone]  Fax: [phone]     Has the patient been seen for an appointment in the last year OR does the patient have an upcoming appointment? Yes.    Agent: Please be advised that RX refills may take up to 3 business days. We ask that you follow-up with your pharmacy.

## 2023-03-12 MED ORDER — SEMAGLUTIDE (2 MG/DOSE) 8 MG/3ML ~~LOC~~ SOPN
2.0000 mg | PEN_INJECTOR | SUBCUTANEOUS | 1 refills | Status: DC
Start: 2023-03-12 — End: 2023-09-13

## 2023-03-12 NOTE — Telephone Encounter (Signed)
Labs are in date.  Requested Prescriptions  Pending Prescriptions Disp Refills   Semaglutide, 2 MG/DOSE, 8 MG/3ML SOPN 9 mL 1    Sig: Inject 2 mg as directed once a week. Dx: E11.65     Endocrinology:  Diabetes - GLP-1 Receptor Agonists - semaglutide Failed - 03/11/2023  4:50 PM      Failed - HBA1C in normal range and within 180 days    Hemoglobin A1C  Date Value Ref Range Status  11/18/2022 6.5 (A) 4.0 - 5.6 % Final   Hgb A1c MFr Bld  Date Value Ref Range Status  03/30/2022 7.3 (H) 4.8 - 5.6 % Final    Comment:             Prediabetes: 5.7 - 6.4          Diabetes: >6.4          Glycemic control for adults with diabetes: <7.0          Failed - Cr in normal range and within 360 days    Creatinine, Ser  Date Value Ref Range Status  12/21/2022 1.07 (H) 0.44 - 1.00 mg/dL Final         Passed - Valid encounter within last 6 months    Recent Outpatient Visits           2 months ago Type 2 diabetes mellitus with hyperglycemia, with long-term current use of insulin (HCC)   [place] [facility], MD   3 months ago Acute cystitis without hematuria   [place] [facility], Lauris Poag, MD   8 months ago Type 2 diabetes mellitus with hyperglycemia, with long-term current use of insulin [facility]   [place] [facility] at [facility], Lauris Poag, MD   8 months ago Type 2 diabetes mellitus with hyperglycemia, with long-term current use of insulin [facility])   [facility] [place], Huey L, RPH-CPP   9 months ago Right hip pain   [place] [facility] at [facility], MD       Future Appointments             In 2 months Georganna Skeans, MD [facility] at [facility]

## 2023-03-23 ENCOUNTER — Telehealth: Payer: Self-pay | Admitting: Family Medicine

## 2023-03-23 NOTE — Telephone Encounter (Signed)
Copied from CRM [id number]. Topic: General - Inquiry >> Mar 23, 2023 11:51 AM Haroldine Laws wrote: Reason for CRM: pt called with questions about the generic fosamax.  She has a different prescription than what is listed.  CB@  [phone]

## 2023-03-26 ENCOUNTER — Other Ambulatory Visit: Payer: Self-pay | Admitting: Family Medicine

## 2023-03-26 ENCOUNTER — Telehealth: Payer: Self-pay | Admitting: Family Medicine

## 2023-03-26 ENCOUNTER — Other Ambulatory Visit: Payer: Self-pay | Admitting: *Deleted

## 2023-03-26 MED ORDER — ALENDRONATE SODIUM 10 MG PO TABS: 10.0000 mg | ORAL_TABLET | Freq: Every day | ORAL | 0 refills | Status: DC

## 2023-03-26 NOTE — Telephone Encounter (Signed)
Medication Refill - Medication: alendronate (FOSAMAX) 10 MG tablet [[id number]]   Has the patient contacted their pharmacy? Yes.   (Agent: If no, request that the patient contact the pharmacy for the refill. If patient does not wish to contact the pharmacy document the reason why and proceed with request.) (Agent: If yes, when and what did the pharmacy advise?)  Preferred Pharmacy (with phone number or street name): [facility] - [place], [place] - [address] Phone: [phone]  Fax: [phone]   Has the patient been seen for an appointment in the last year OR does the patient have an upcoming appointment? Yes.    Agent: Please be advised that RX refills may take up to 3 business days. We ask that you follow-up with your pharmacy.

## 2023-03-26 NOTE — Telephone Encounter (Signed)
Requested medications are due for refill today.  yes  Requested medications are on the active medications list.  yes  Last refill. 02/05/2023 #90 0 rf  Future visit scheduled.   yes  Notes to clinic.  Missing labs.    Requested Prescriptions  Pending Prescriptions Disp Refills   alendronate (FOSAMAX) 10 MG tablet 90 tablet 0    Sig: Take 1 tablet (10 mg total) by mouth daily before breakfast.     Endocrinology:  Bisphosphonates Failed - 03/26/2023 11:15 AM      Failed - Vitamin D in normal range and within 360 days    No results found for: "YN8295AO1", "HY8657QI6", "NG295MW4XLK", "25OHVITD3", "25OHVITD2", "25OHVITD1", "VD25OH"       Failed - Cr in normal range and within 360 days    Creatinine, Ser  Date Value Ref Range Status  12/21/2022 1.07 (H) 0.44 - 1.00 mg/dL Final         Failed - Mg Level in normal range and within 360 days    Magnesium  Date Value Ref Range Status  10/18/2018 2.1 1.7 - 2.4 mg/dL Final    Comment:    Performed at [facility], [address], [place], [place] [address]         Failed - Phosphate in normal range and within 360 days    Phosphorus  Date Value Ref Range Status  10/18/2018 2.6 2.5 - 4.6 mg/dL Final    Comment:    Performed at [facility], [address], [place], [place] [address]         Failed - Bone Mineral Density or Dexa Scan completed in the last 2 years      Passed - Ca in normal range and within 360 days    Calcium  Date Value Ref Range Status  12/21/2022 9.1 8.9 - 10.3 mg/dL Final         Passed - eGFR is 30 or above and within 360 days    GFR calc Af Amer  Date Value Ref Range Status  08/08/2020 65 >59 mL/min/1.73 Final    Comment:    **In accordance with recommendations from the NKF-ASN Task force,**   [facility] is in the process of updating its eGFR calculation to the   2021 CKD-EPI creatinine equation that estimates kidney function   without a race variable.    GFR, Estimated  Date Value Ref  Range Status  12/21/2022 54 (L) >60 mL/min Final    Comment:    (NOTE) Calculated using the CKD-EPI Creatinine Equation (2021)    eGFR  Date Value Ref Range Status  03/30/2022 77 >59 mL/min/1.73 Final         Passed - Valid encounter within last 12 months    Recent Outpatient Visits           2 months ago Type 2 diabetes mellitus with hyperglycemia, with long-term current use of insulin (HCC)   [place] [facility], MD   4 months ago Acute cystitis without hematuria   [place] [facility] at [facility], Lauris Poag, MD   8 months ago Type 2 diabetes mellitus with hyperglycemia, with long-term current use of insulin [facility])   [place] [facility] at [facility], Lauris Poag, MD   9 months ago Type 2 diabetes mellitus with hyperglycemia, with long-term current use of insulin [facility])   [facility] [place], Cornelius Moras, RPH-CPP  10 months ago Right hip pain   [place] [facility] at [facility], MD       Future Appointments             In 1 month Georganna Skeans, MD [facility] at [facility]

## 2023-03-26 NOTE — Telephone Encounter (Signed)
Pt is calling to check on the request of her Semaglutide, 2 MG/DOSE, 8 MG/3ML SOPN Please advise CB- [phone]

## 2023-03-26 NOTE — Telephone Encounter (Signed)
Patient called and advised semaglutide was sent to [facility] on 03/12/23 and that she will need to reach out to them to see if they received it from [place], then call back if they did not. She says she's just getting home and there is a package that has it inside, so she's good with that. She says that she is also needing her fosamax and that she takes it for her bones once a week. I advised it was sent to the pharmacy on 02/05/23 and she's supposed to take it daily as it says on the instructions, not weekly. She says she will look for the box and see what it says, she may have read it wrong. She says she will call the pharmacy to check on it.

## 2023-03-26 NOTE — Telephone Encounter (Signed)
Talk to patient and she stated "she needs Alendronate medication"

## 2023-03-29 ENCOUNTER — Other Ambulatory Visit: Payer: Self-pay | Admitting: Family Medicine

## 2023-03-29 MED ORDER — ALENDRONATE SODIUM 10 MG PO TABS: 10.0000 mg | ORAL_TABLET | Freq: Every day | ORAL | 1 refills | Status: DC

## 2023-04-08 ENCOUNTER — Telehealth: Payer: Self-pay | Admitting: Family Medicine

## 2023-04-08 NOTE — Telephone Encounter (Signed)
Copied from CRM [id number]. Topic: General - Other >> Apr 08, 2023  9:39 AM Phill Myron wrote: Reason for CRM:  Ms Tarkington would like her medication from now on going to the [facility] pharmacy

## 2023-04-08 NOTE — Telephone Encounter (Signed)
done

## 2023-04-13 NOTE — Progress Notes (Deleted)
Established Patient Office Visit  Subjective    Patient ID: Emily Gray, female    DOB: 27-Jan-1946  Age: 77 y.o. MRN: [id number]  CC:  Chief Complaint  Patient presents with   Follow-up    6 month    HPI Emily Gray presents for follow up of recent ED visit for blood in urine. She reports that her sx have improved since tx with abx.    Outpatient Encounter Medications as of 01/04/2023  Medication Sig   aspirin 81 MG tablet Take 81 mg by mouth daily.   atorvastatin (LIPITOR) 20 MG tablet Take 1 tablet (20 mg total) by mouth daily.   Blood Glucose Monitoring Suppl (ONETOUCH VERIO REFLECT) w/Device KIT Use to check blood sugar TID. E11.65   chlorhexidine (PERIDEX) 0.12 % solution SMARTSIG:1 Capful(s) By Mouth Twice Daily   cholecalciferol (VITAMIN D3) 25 MCG (1000 UT) tablet Take 1 tablet (1,000 Units total) by mouth daily.   COD LIVER OIL/VITAMINS A & D PO Take by mouth.   furosemide (LASIX) 20 MG tablet TAKE 1/2 (ONE-HALF) TABLET BY MOUTH EVERY OTHER DAY FOR 7 DAYS   gabapentin (NEURONTIN) 300 MG capsule Take 1 capsule by mouth 3 (three) times daily.   glucose blood (ONETOUCH VERIO) test strip Use to check blood sugar TID. E11.65   ibuprofen (ADVIL) 600 MG tablet Take 1 tablet (600 mg total) by mouth every 8 (eight) hours as needed. Use sparingly   insulin aspart (NOVOLOG FLEXPEN) 100 UNIT/ML FlexPen INJECT 12 UNITS SUBCUTANEOUSLY TWICE DAILY   insulin glargine-yfgn (SEMGLEE) 100 UNIT/ML Pen INJECT 44 UNITS ITNO THE SKIN DAILY   Insulin Pen Needle 32G X 4 MM MISC Use with insulin pen daily. Dx: E11.9   letrozole (FEMARA) 2.5 MG tablet 1 {tablet} by oral route.   lisinopril-hydrochlorothiazide (ZESTORETIC) 20-12.5 MG tablet Take 2 tablets by mouth daily.   VITAMIN D PO Take by mouth.   [DISCONTINUED] alendronate (FOSAMAX) 10 MG tablet Take by mouth.   [DISCONTINUED] Continuous Blood Gluc Sensor (FREESTYLE LIBRE 2 SENSOR) MISC Utilize as directed q2 weeks to monitor blood glucose -  E11.9   [DISCONTINUED] Semaglutide, 2 MG/DOSE, 8 MG/3ML SOPN Inject 2 mg as directed once a week. Dx: E11.65   nitrofurantoin, macrocrystal-monohydrate, (MACROBID) 100 MG capsule Take 1 capsule (100 mg total) by mouth 2 (two) times daily. (Patient not taking: Reported on 01/04/2023)   No facility-administered encounter medications on file as of 01/04/2023.    Past Medical History:  Diagnosis Date   Acid reflux    Arthritis    Breast cancer (HCC)    Hypertension    Obesity, unspecified    Pure hyperglyceridemia     Past Surgical History:  Procedure Laterality Date   APPENDECTOMY     BILATERAL SALPINGOOPHORECTOMY     BREAST LUMPECTOMY Right    CATARACT EXTRACTION Left    EYE SURGERY Left 07/20/2008   macro-hole, filled with gel substance at [facility]   HYSTEROSCOPY WITH D & C  12/03/2004   TONSILLECTOMY     TUBAL LIGATION Bilateral     Family History  Problem Relation Age of Onset   Hypertension Mother    Cancer Father    Hypertension Father    Breast cancer Sister     Social History   Socioeconomic History   Marital status: Married    Spouse name: Not on file   Number of children: Not on file   Years of education: Not on file   Highest education level: Not on  file  Occupational History   Not on file  Tobacco Use   Smoking status: Former   Smokeless tobacco: Never  Vaping Use   Vaping status: Never Used  Substance and Sexual Activity   Alcohol use: No   Drug use: No   Sexual activity: Not on file  Other Topics Concern   Not on file  Social History Narrative   Not on file   Social Determinants of Health   Financial Resource Strain: Low Risk  (05/29/2022)   Overall Financial Resource Strain (CARDIA)    Difficulty of Paying Living Expenses: Not hard at all  Food Insecurity: No Food Insecurity (05/29/2022)   Hunger Vital Sign    Worried About Running Out of Food in the Last Year: Never true    Ran Out of Food in the Last Year: Never true  Transportation  Needs: No Transportation Needs (05/29/2022)   PRAPARE - Administrator, Civil Service (Medical): No    Lack of Transportation (Non-Medical): No  Physical Activity: Insufficiently Active (05/29/2022)   Exercise Vital Sign    Days of Exercise per Week: 1 day    Minutes of Exercise per Session: 20 min  Stress: Stress Concern Present (05/29/2022)   [employer] of Occupational Health - Occupational Stress Questionnaire    Feeling of Stress : To some extent  Social Connections: Moderately Isolated (05/29/2022)   Social Connection and Isolation Panel [NHANES]    Frequency of Communication with Friends and Family: More than three times a week    Frequency of Social Gatherings with Friends and Family: Once a week    Attends Religious Services: Never    Database administrator or Organizations: No    Attends Banker Meetings: Never    Marital Status: Married  Catering manager Violence: Not At Risk (05/29/2022)   Humiliation, Afraid, Rape, and Kick questionnaire    Fear of Current or Ex-Partner: No    Emotionally Abused: No    Physically Abused: No    Sexually Abused: No    Review of Systems  All other systems reviewed and are negative.       Objective    BP 132/74   Pulse 83   Temp 98.1 F (36.7 C) (Oral)   Resp 16   Wt 229 lb 3.2 oz (104 kg)   SpO2 99%   BMI 39.34 kg/m   Physical Exam Vitals and nursing note reviewed.  Constitutional:      General: She is not in acute distress. Cardiovascular:     Rate and Rhythm: Normal rate and regular rhythm.  Pulmonary:     Effort: Pulmonary effort is normal.     Breath sounds: Normal breath sounds.  Musculoskeletal:     Comments: Patient utilizing cane for stability  Neurological:     General: No focal deficit present.     Mental Status: She is alert and oriented to person, place, and time.         Assessment & Plan:  1. Type 2 diabetes mellitus with hyperglycemia, with long-term current use  of insulin (HCC) Recent A1c is at goal. Continue  - HM DIABETES FOOT EXAM - Urine microalbumin-creatinine with uACR  2. Gross hematuria Improving/resolved. Consider Urology consult if sx recur/persist. Recommend adequate hydration  3. Primary hypertension Appears stable. Continue    No follow-ups on file.   Tommie Raymond, MD

## 2023-05-04 MED ORDER — NOVOLOG FLEXPEN 100 UNIT/ML ~~LOC~~ SOPN: 12.0000 [IU] | PEN_INJECTOR | Freq: Two times a day (BID) | SUBCUTANEOUS | 0 refills | Status: DC

## 2023-05-17 ENCOUNTER — Other Ambulatory Visit: Payer: Self-pay | Admitting: Family Medicine

## 2023-05-17 DIAGNOSIS — E782 Mixed hyperlipidemia: Secondary | ICD-10-CM

## 2023-05-17 NOTE — Telephone Encounter (Signed)
Medication Refill - Medication: atorvastatin (LIPITOR) 20 MG tablet   Has the patient contacted their pharmacy? No.  Preferred Pharmacy (with phone number or street name):  [facility] - [place], [place] - [address] [address] Phone: [phone]  Fax: [phone]     Has the patient been seen for an appointment in the last year OR does the patient have an upcoming appointment? Yes.    Agent: Please be advised that RX refills may take up to 3 business days. We ask that you follow-up with your pharmacy.

## 2023-05-18 NOTE — Telephone Encounter (Signed)
Requested medications are due for refill today.  yes  Requested medications are on the active medications list.  yes  Last refill. 06/30/2022 #90 1 rf  Future visit scheduled.   yes  Notes to clinic.  Labs are expired.    Requested Prescriptions  Pending Prescriptions Disp Refills   atorvastatin (LIPITOR) 20 MG tablet 90 tablet 1    Sig: Take 1 tablet (20 mg total) by mouth daily.     Cardiovascular:  Antilipid - Statins Failed - 05/17/2023  5:50 PM      Failed - Lipid Panel in normal range within the last 12 months    Cholesterol, Total  Date Value Ref Range Status  03/30/2022 133 100 - 199 mg/dL Final   LDL Chol Calc (NIH)  Date Value Ref Range Status  03/30/2022 82 0 - 99 mg/dL Final   LDL Direct  Date Value Ref Range Status  08/08/2020 85 0 - 99 mg/dL Final   HDL  Date Value Ref Range Status  03/30/2022 39 (L) >39 mg/dL Final   Triglycerides  Date Value Ref Range Status  03/30/2022 52 0 - 149 mg/dL Final         Passed - Patient is not pregnant      Passed - Valid encounter within last 12 months    Recent Outpatient Visits           4 months ago Type 2 diabetes mellitus with hyperglycemia, with long-term current use of insulin (HCC)   [place] [facility], MD   6 months ago Acute cystitis without hematuria   [place] [facility] at [facility], Lauris Poag, MD   10 months ago Type 2 diabetes mellitus with hyperglycemia, with long-term current use of insulin [facility])   [place] [facility] at [facility], Lauris Poag, MD   11 months ago Type 2 diabetes mellitus with hyperglycemia, with long-term current use of insulin [facility])   [place] [facility] [place], Cornelius Moras, RPH-CPP   1 year ago Right hip pain    [facility] Care at [facility], MD       Future Appointments             In 1 week Georganna Skeans, MD [facility] at [facility]

## 2023-05-24 ENCOUNTER — Ambulatory Visit: Payer: Medicare Other | Admitting: Family Medicine

## 2023-05-27 ENCOUNTER — Encounter: Payer: Self-pay | Admitting: Family Medicine

## 2023-05-27 ENCOUNTER — Ambulatory Visit (INDEPENDENT_AMBULATORY_CARE_PROVIDER_SITE_OTHER): Payer: Medicare Other | Admitting: Family Medicine

## 2023-05-27 VITALS — BP 130/60 | HR 84 | Temp 98.6°F | Resp 16 | Ht 64.0 in | Wt 233.6 lb

## 2023-05-27 DIAGNOSIS — Z794 Long term (current) use of insulin: Secondary | ICD-10-CM | POA: Diagnosis not present

## 2023-05-27 DIAGNOSIS — E782 Mixed hyperlipidemia: Secondary | ICD-10-CM | POA: Diagnosis not present

## 2023-05-27 DIAGNOSIS — E1165 Type 2 diabetes mellitus with hyperglycemia: Secondary | ICD-10-CM

## 2023-05-27 DIAGNOSIS — I10 Essential (primary) hypertension: Secondary | ICD-10-CM

## 2023-05-27 LAB — POCT GLYCOSYLATED HEMOGLOBIN (HGB A1C): Hemoglobin A1C: 6.6 % — AB (ref 4.0–5.6)

## 2023-05-27 NOTE — Progress Notes (Signed)
Established Patient Office Visit  Subjective    Patient ID: Emily Gray, female    DOB: August 06, 1945  Age: 77 y.o. MRN: [id number]  CC:  Chief Complaint  Patient presents with   Follow-up    6 month follow up    HPI Emily Gray presents for routine follow up of chronic med issues including diabetes and hypertension. Patient reports that she is taking meds as directed and denies acute complaints or concerns.      Past Medical History:  Diagnosis Date   Acid reflux    Arthritis    Breast cancer (HCC)    Hypertension    Obesity, unspecified    Pure hyperglyceridemia     Past Surgical History:  Procedure Laterality Date   APPENDECTOMY     BILATERAL SALPINGOOPHORECTOMY     BREAST LUMPECTOMY Right    CATARACT EXTRACTION Left    EYE SURGERY Left 07/20/2008   macro-hole, filled with gel substance at [facility]   HYSTEROSCOPY WITH D & C  12/03/2004   TONSILLECTOMY     TUBAL LIGATION Bilateral     Family History  Problem Relation Age of Onset   Hypertension Mother    Cancer Father    Hypertension Father    Breast cancer Sister     Social History   Socioeconomic History   Marital status: Married    Spouse name: Not on file   Number of children: Not on file   Years of education: Not on file   Highest education level: Not on file  Occupational History   Not on file  Tobacco Use   Smoking status: Former   Smokeless tobacco: Never  Vaping Use   Vaping status: Never Used  Substance and Sexual Activity   Alcohol use: No   Drug use: No   Sexual activity: Not on file  Other Topics Concern   Not on file  Social History Narrative   Not on file   Social Determinants of Health   Financial Resource Strain: Low Risk  (05/29/2022)   Overall Financial Resource Strain (CARDIA)    Difficulty of Paying Living Expenses: Not hard at all  Food Insecurity: No Food Insecurity (05/29/2022)   Hunger Vital Sign    Worried About Running Out of Food in the Last Year: Never true     Ran Out of Food in the Last Year: Never true  Transportation Needs: No Transportation Needs (05/29/2022)   PRAPARE - Administrator, Civil Service (Medical): No    Lack of Transportation (Non-Medical): No  Physical Activity: Insufficiently Active (05/29/2022)   Exercise Vital Sign    Days of Exercise per Week: 1 day    Minutes of Exercise per Session: 20 min  Stress: Stress Concern Present (05/29/2022)   [employer] of Occupational Health - Occupational Stress Questionnaire    Feeling of Stress : To some extent  Social Connections: Moderately Isolated (05/29/2022)   Social Connection and Isolation Panel [NHANES]    Frequency of Communication with Friends and Family: More than three times a week    Frequency of Social Gatherings with Friends and Family: Once a week    Attends Religious Services: Never    Database administrator or Organizations: No    Attends Banker Meetings: Never    Marital Status: Married  Catering manager Violence: Not At Risk (05/29/2022)   Humiliation, Afraid, Rape, and Kick questionnaire    Fear of Current or Ex-Partner: No  Emotionally Abused: No    Physically Abused: No    Sexually Abused: No    Review of Systems  All other systems reviewed and are negative.       Objective    BP 130/60 (BP Location: Right Arm, Patient Position: Sitting, Cuff Size: Normal)   Pulse 84   Temp 98.6 F (37 C) (Oral)   Resp 16   Ht 5\' 4"  (1.626 m)   Wt 233 lb 9.6 oz (106 kg)   SpO2 95%   BMI 40.10 kg/m   Physical Exam Vitals and nursing note reviewed.  Constitutional:      General: She is not in acute distress. Cardiovascular:     Rate and Rhythm: Normal rate and regular rhythm.  Pulmonary:     Effort: Pulmonary effort is normal.     Breath sounds: Normal breath sounds.  Musculoskeletal:     Comments: Patient utilizing cane for stability  Neurological:     General: No focal deficit present.     Mental Status: She is  alert and oriented to person, place, and time.         Assessment & Plan:  1. Type 2 diabetes mellitus with hyperglycemia, with long-term current use of insulin (HCC) A1c is stable and at goal. Continue  - POCT glycosylated hemoglobin (Hb A1C) - Urine Albumin/Creatinine with ratio (send out) [LAB689]  2. Primary hypertension Continue  3. Mixed hyperlipidemia Continue  Return in about 6 months (around 11/24/2023) for follow up.   Tommie Raymond, MD

## 2023-05-28 LAB — MICROALBUMIN / CREATININE URINE RATIO
Creatinine, Urine: 145.4 mg/dL
Microalb/Creat Ratio: 14 mg/g{creat} (ref 0–29)
Microalbumin, Urine: 20.4 ug/mL

## 2023-06-01 ENCOUNTER — Other Ambulatory Visit: Payer: Self-pay | Admitting: Family Medicine

## 2023-06-01 DIAGNOSIS — E782 Mixed hyperlipidemia: Secondary | ICD-10-CM

## 2023-06-01 NOTE — Telephone Encounter (Unsigned)
Copied from CRM [id number]. Topic: General - Other >> Jun 01, 2023 10:14 AM Everette C wrote: Reason for CRM: Medication Refill - Most Recent Primary Care Visit:  Provider: Georganna Skeans Department: [facility] Visit Type: OFFICE VISIT Date: 05/27/2023  Medication: atorvastatin (LIPITOR) 20 MG tablet [[id number]]  alendronate (FOSAMAX) 10 MG tablet [[id number]]  Has the patient contacted their pharmacy? Yes (Agent: If no, request that the patient contact the pharmacy for the refill. If patient does not wish to contact the pharmacy document the reason why and proceed with request.) (Agent: If yes, when and what did the pharmacy advise?)  Is this the correct pharmacy for this prescription? Yes If no, delete pharmacy and type the correct one.  This is the patient's preferred pharmacy:  [facility] - [place], [place] - [address] [facility] [address] [place] [address] Phone: [phone] Fax: [phone]   Has the prescription been filled recently? No  Is the patient out of the medication? Yes  Has the patient been seen for an appointment in the last year OR does the patient have an upcoming appointment? Yes  Can we respond through MyChart? No  Agent: Please be advised that Rx refills may take up to 3 business days. We ask that you follow-up with your pharmacy.

## 2023-06-02 MED ORDER — ALENDRONATE SODIUM 10 MG PO TABS: 10.0000 mg | ORAL_TABLET | Freq: Every day | ORAL | 1 refills | Status: DC

## 2023-06-02 NOTE — Telephone Encounter (Signed)
Requested medication (s) are due for refill today: yes   Requested medication (s) are on the active medication list: yes   Last refill:  lipitor - 06/30/22 #90 1 refills fosamax- 03/29/23 #90 1 refills  Future visit scheduled: yes in 5  months   Notes to clinic:  protocol failed last labs 03/30/22. Do you want to refill Rxs?     Requested Prescriptions  Pending Prescriptions Disp Refills   atorvastatin (LIPITOR) 20 MG tablet 90 tablet 1    Sig: Take 1 tablet (20 mg total) by mouth daily.     Cardiovascular:  Antilipid - Statins Failed - 06/01/2023  1:21 PM      Failed - Lipid Panel in normal range within the last 12 months    Cholesterol, Total  Date Value Ref Range Status  03/30/2022 133 100 - 199 mg/dL Final   LDL Chol Calc (NIH)  Date Value Ref Range Status  03/30/2022 82 0 - 99 mg/dL Final   LDL Direct  Date Value Ref Range Status  08/08/2020 85 0 - 99 mg/dL Final   HDL  Date Value Ref Range Status  03/30/2022 39 (L) >39 mg/dL Final   Triglycerides  Date Value Ref Range Status  03/30/2022 52 0 - 149 mg/dL Final         Passed - Patient is not pregnant      Passed - Valid encounter within last 12 months    Recent Outpatient Visits           6 days ago Type 2 diabetes mellitus with hyperglycemia, with long-term current use of insulin (HCC)   [place] [facility], Lauris Poag, MD   4 months ago Type 2 diabetes mellitus with hyperglycemia, with long-term current use of insulin (HCC)   [place] [facility], MD   6 months ago Acute cystitis without hematuria   [place] [facility] at [facility], Lauris Poag, MD   11 months ago Type 2 diabetes mellitus with hyperglycemia, with long-term current use of insulin [facility])   [place] [facility] at [facility], Lauris Poag, MD   11 months ago Type 2 diabetes mellitus with hyperglycemia, with long-term current use of insulin (HCC)   [place]  [facility] [place]. [facility] Drucilla Chalet, RPH-CPP       Future Appointments             In 5 months Georganna Skeans, MD [facility] at [facility]             alendronate (FOSAMAX) 10 MG tablet 90 tablet 1    Sig: Take 1 tablet (10 mg total) by mouth daily before breakfast.     Endocrinology:  Bisphosphonates Failed - 06/01/2023  1:21 PM      Failed - Vitamin D in normal range and within 360 days    No results found for: "ZO1096EA5", "WU9811BJ4", "VD125OH2TOT", "25OHVITD3", "25OHVITD2", "25OHVITD1", "VD25OH"       Failed - Cr in normal range and within 360 days    Creatinine, Ser  Date Value Ref Range Status  12/21/2022 1.07 (H) 0.44 - 1.00 mg/dL Final         Failed - Mg Level in normal range and within 360 days    Magnesium  Date Value Ref Range Status  10/18/2018 2.1 1.7 - 2.4 mg/dL Final    Comment:  Performed at [facility], [address], [place], [place] [address]         Failed - Phosphate in normal range and within 360 days    Phosphorus  Date Value Ref Range Status  10/18/2018 2.6 2.5 - 4.6 mg/dL Final    Comment:    Performed at [facility], [address] [place], [place] [address]         Failed - Bone Mineral Density or Dexa Scan completed in the last 2 years      Passed - Ca in normal range and within 360 days    Calcium  Date Value Ref Range Status  12/21/2022 9.1 8.9 - 10.3 mg/dL Final         Passed - eGFR is 30 or above and within 360 days    GFR calc Af Amer  Date Value Ref Range Status  08/08/2020 65 >59 mL/min/1.73 Final    Comment:    **In accordance with recommendations from the NKF-ASN Task force,**   [facility] is in the process of updating its eGFR calculation to the   2021 CKD-EPI creatinine equation that estimates kidney function   without a race variable.    GFR, Estimated  Date Value Ref Range Status  12/21/2022 54 (L) >60 mL/min Final     Comment:    (NOTE) Calculated using the CKD-EPI Creatinine Equation (2021)    eGFR  Date Value Ref Range Status  03/30/2022 77 >59 mL/min/1.73 Final         Passed - Valid encounter within last 12 months    Recent Outpatient Visits           6 days ago Type 2 diabetes mellitus with hyperglycemia, with long-term current use of insulin (HCC)   [place] [facility] at [facility], Lauris Poag, MD   4 months ago Type 2 diabetes mellitus with hyperglycemia, with long-term current use of insulin (HCC)   [place] [facility], MD   6 months ago Acute cystitis without hematuria   [place] [facility] at [facility], Lauris Poag, MD   11 months ago Type 2 diabetes mellitus with hyperglycemia, with long-term current use of insulin [facility])   [place] [facility] at [facility], Lauris Poag, MD   11 months ago Type 2 diabetes mellitus with hyperglycemia, with long-term current use of insulin (HCC)   [place] [facility] [place]. [facility] Drucilla Chalet, RPH-CPP       Future Appointments             In 5 months Georganna Skeans, MD [facility] at [facility]

## 2023-06-22 ENCOUNTER — Other Ambulatory Visit: Payer: Self-pay

## 2023-06-22 DIAGNOSIS — E782 Mixed hyperlipidemia: Secondary | ICD-10-CM

## 2023-06-22 NOTE — Telephone Encounter (Signed)
Requested medications are due for refill today.  yes  Requested medications are on the active medications list.  yes  Last refill. 06/30/2022 #90 1 rf  Future visit scheduled.   yes  Notes to clinic.  Labs are expired. Pt called today requesting follow up on med request from November. Please advise.    Requested Prescriptions  Pending Prescriptions Disp Refills   atorvastatin (LIPITOR) 20 MG tablet 90 tablet 1    Sig: Take 1 tablet (20 mg total) by mouth daily.     Cardiovascular:  Antilipid - Statins Failed - 06/22/2023  2:53 PM      Failed - Lipid Panel in normal range within the last 12 months    Cholesterol, Total  Date Value Ref Range Status  03/30/2022 133 100 - 199 mg/dL Final   LDL Chol Calc (NIH)  Date Value Ref Range Status  03/30/2022 82 0 - 99 mg/dL Final   LDL Direct  Date Value Ref Range Status  08/08/2020 85 0 - 99 mg/dL Final   HDL  Date Value Ref Range Status  03/30/2022 39 (L) >39 mg/dL Final   Triglycerides  Date Value Ref Range Status  03/30/2022 52 0 - 149 mg/dL Final         Passed - Patient is not pregnant      Passed - Valid encounter within last 12 months    Recent Outpatient Visits           3 weeks ago Type 2 diabetes mellitus with hyperglycemia, with long-term current use of insulin (HCC)   [place] [facility], Lauris Poag, MD   5 months ago Type 2 diabetes mellitus with hyperglycemia, with long-term current use of insulin (HCC)   [place] [facility] at [facility], MD   7 months ago Acute cystitis without hematuria   [place] [facility] at [facility], Lauris Poag, MD   11 months ago Type 2 diabetes mellitus with hyperglycemia, with long-term current use of insulin [facility])   [place] [facility], Lauris Poag, MD   1 year ago Type 2 diabetes mellitus with hyperglycemia, with long-term current use of insulin (HCC)   [place] [facility] [place]. [facility] Drucilla Chalet, RPH-CPP       Future Appointments             In 5 months Georganna Skeans, MD [facility] at [facility]

## 2023-06-23 MED ORDER — ATORVASTATIN CALCIUM 20 MG PO TABS
20.0000 mg | ORAL_TABLET | Freq: Every day | ORAL | 1 refills | Status: DC
Start: 2023-06-23 — End: 2023-10-15

## 2023-06-25 ENCOUNTER — Ambulatory Visit (HOSPITAL_COMMUNITY)
Admission: EM | Admit: 2023-06-25 | Discharge: 2023-06-25 | Disposition: A | Discharge status: Home / Self Care | Payer: Medicare Other | Attending: Family Medicine | Admitting: Family Medicine

## 2023-06-25 ENCOUNTER — Telehealth: Payer: Self-pay | Admitting: Family Medicine

## 2023-06-25 ENCOUNTER — Encounter (HOSPITAL_COMMUNITY): Payer: Self-pay | Admitting: *Deleted

## 2023-06-25 ENCOUNTER — Ambulatory Visit: Payer: Self-pay | Admitting: *Deleted

## 2023-06-25 DIAGNOSIS — N3000 Acute cystitis without hematuria: Secondary | ICD-10-CM | POA: Diagnosis not present

## 2023-06-25 LAB — POCT URINALYSIS DIP (MANUAL ENTRY)
Bilirubin, UA: NEGATIVE
Glucose, UA: NEGATIVE mg/dL
Ketones, POC UA: NEGATIVE mg/dL
Nitrite, UA: POSITIVE — AB
Protein Ur, POC: 30 mg/dL — AB
Spec Grav, UA: 1.02 (ref 1.010–1.025)
Urobilinogen, UA: 0.2 U/dL
pH, UA: 5.5 (ref 5.0–8.0)

## 2023-06-25 MED ORDER — CEFDINIR 300 MG PO CAPS: 300.0000 mg | ORAL_CAPSULE | Freq: Two times a day (BID) | ORAL | 0 refills | Status: AC

## 2023-06-25 NOTE — Telephone Encounter (Signed)
error 

## 2023-06-25 NOTE — Telephone Encounter (Signed)
  Chief Complaint: Dysuria Symptoms: Dysuria, frequency, urgency, malodorous and cloudy urine. Frequency: Little over a week Pertinent Negatives: Patient denies fever Disposition: [] ED /[x] Urgent Care (no appt availability in office) / [] Appointment(In office/virtual)/ []  [place] [facility]/ [] Home Care/ [] Refused Recommended Disposition /[] [place] [facility]/ []  Follow-up with PCP Additional Notes:  No availability at practice, advised UC, states will follow disposition. Care advise provided, pt verbalizes understanding.  Reason for Disposition  Bad or foul-smelling urine  Answer Assessment - Initial Assessment Questions 1. SYMPTOM: "What's the main symptom you're concerned about?" (e.g., frequency, incontinence)     Dysuria 2. ONSET: "When did the    start?"     >1 week 3. PAIN: "Is there any pain?" If Yes, ask: "How bad is it?" (Scale: 1-10; mild, moderate, severe)     Yes severe 4. CAUSE: "What do you think is causing the symptoms?"     UTI 5. OTHER SYMPTOMS: "Do you have any other symptoms?" (e.g., blood in urine, fever, flank pain, pain with urination)     Frequency, urgency, dysuria,cloudy  with odor.  Protocols used: Urinary Symptoms-A-AH

## 2023-06-25 NOTE — ED Provider Notes (Signed)
[facility]    CSN: [id number] Arrival date & time: 06/25/23  1301      History   Chief Complaint Chief Complaint  Patient presents with   Dysuria   pelvic pressure    HPI Emily Gray is a 77 y.o. female.    Dysuria  Patient is here for urinary symptoms x 3 weeks.  Having painful urination, foul smell, and pelvic pain for 3 weeks.  Some urinary frequency.  Using cranberry.  No fevers/chills.  No new back pain.  She was last treated in June for uti with omnicef and that cleared easily.        Past Medical History:  Diagnosis Date   Acid reflux    Arthritis    Breast cancer (HCC)    Hypertension    Obesity, unspecified    Pure hyperglyceridemia     Patient Active Problem List   Diagnosis Date Noted   Morbid obesity with BMI of 40.0-44.9, adult (HCC) 01/04/2023   Osteoarthritis 01/04/2023   Malignant tumor of breast (HCC) 01/04/2023   Hypertensive disorder 01/04/2023   Obstructive sleep apnea syndrome 01/04/2023   Person encountering health services to consult on behalf of another person 01/04/2023   Encounter for other specified special examinations 11/28/2021   Other specified counseling 11/28/2021   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   DM (diabetes mellitus), type 2 (HCC) 10/20/2018   Obesity 10/20/2018   OSA (obstructive sleep apnea) 10/18/2018   Hypertension    Breast cancer (HCC)    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      Home Medications      Family History Family History  Problem Relation Age of Onset   Hypertension Mother    Cancer Father    Hypertension Father    Breast cancer Sister     Social History Social History   Tobacco Use   Smoking status: Former   Smokeless tobacco: Never  Advertising account planner   Vaping status: Never Used  Substance Use Topics   Alcohol use: No   Drug use: No     Allergies   Patient has no known allergies.   Review of Systems Review of Systems  Constitutional: Negative.   HENT: Negative.    Respiratory: Negative.    Cardiovascular: Negative.   Gastrointestinal: Negative.   Genitourinary:  Positive for dysuria and frequency.  Musculoskeletal: Negative.   Psychiatric/Behavioral: Negative.       Physical Exam Triage Vital Signs ED Triage Vitals  Encounter Vitals Group     BP 06/25/23 1416 (!) 152/87     Systolic BP Percentile --      Diastolic BP Percentile --      Pulse Rate 06/25/23 1416 80     Resp 06/25/23 1416 18     Temp 06/25/23 1416 98.2 F (36.8 C)     Temp Source 06/25/23 1416 Oral     SpO2  06/25/23 1416 96 %     Weight --      Height --      Head Circumference --      Peak Flow --      Pain Score 06/25/23 1413 10     Pain Loc --      Pain Education --      Exclude from Growth Chart --    No data found.  Updated Vital Signs BP (!) 152/87 (BP Location: Right Arm)   Pulse 80   Temp 98.2 F (36.8 C) (Oral)   Resp 18   SpO2 96%   Visual Acuity Right Eye Distance:   Left Eye Distance:   Bilateral Distance:    Right Eye Near:   Left Eye Near:    Bilateral Near:     Physical Exam Constitutional:      Appearance: Normal appearance.  Cardiovascular:     Rate and Rhythm: Normal rate and regular rhythm.  Pulmonary:     Effort: Pulmonary effort is normal.     Breath sounds: Normal breath sounds.  Abdominal:     Palpations: Abdomen is soft.     Tenderness: There is no abdominal tenderness.  There is no right CVA tenderness, left CVA tenderness, guarding or rebound.  Neurological:     General: No focal deficit present.     Mental Status: She is alert.  Psychiatric:        Mood and Affect: Mood normal.      UC Treatments / Results  Labs (all labs ordered are listed, but only abnormal results are displayed) Labs Reviewed  POCT URINALYSIS DIP (MANUAL ENTRY) - Abnormal; Notable for the following components:      Result Value   Clarity, UA cloudy (*)    Blood, UA moderate (*)    Protein Ur, POC =30 (*)    Nitrite, UA Positive (*)    Leukocytes, UA Moderate (2+) (*)    All other components within normal limits  URINE CULTURE    EKG   Radiology No results found.  Procedures Procedures (including critical care time)  Medications Ordered in UC Medications - No data to display  Initial Impression / Assessment and Plan / UC Course  I have reviewed the triage vital signs and the nursing notes.  Pertinent labs & imaging results that were available during my care of the patient were reviewed by me and considered in my medical decision making (see chart for details).   Final Clinical Impressions(s) / UC Diagnoses   Final diagnoses:  Acute cystitis without hematuria     Discharge Instructions      You were seen today for urinary tract infection.  Your urine is consistent with this today.  We will culture this, but I am treating you with an antibiotic to take twice/day x 7 days.  Please increase fluid intake.  Follow up if not improving or worsening symptoms.     ED Prescriptions     Medication Sig Dispense Auth. Provider   cefdinir (OMNICEF) 300 MG capsule Take 1 capsule (300 mg total) by mouth 2 (two) times daily for 7 days. 14 capsule Jannifer Franklin, MD      PDMP not reviewed this encounter.   Jannifer Franklin, MD 06/25/23 [phone]

## 2023-06-25 NOTE — ED Triage Notes (Signed)
Pt states she has had dysuria, pelvic pressure, and her urine has a foul smell X 3 weeks. She has been taking OTC meds for Uti and drinking cranberry juice.

## 2023-06-25 NOTE — Discharge Instructions (Signed)
You were seen today for urinary tract infection.  Your urine is consistent with this today.  We will culture this, but I am treating you with an antibiotic to take twice/day x 7 days.  Please increase fluid intake.  Follow up if not improving or worsening symptoms.

## 2023-06-27 LAB — URINE CULTURE: Culture: >=100000 — AB

## 2023-08-05 ENCOUNTER — Ambulatory Visit: Payer: Medicare Other

## 2023-08-05 DIAGNOSIS — Z0000 Encounter for general adult medical examination without abnormal findings: Secondary | ICD-10-CM | POA: Diagnosis not present

## 2023-08-05 IMAGING — MG MM DIGITAL SCREENING BILAT W/ TOMO AND CAD
6 of 10 series · 6 of 30 positions shown · non-contrast
Comparison: Prior studies from [REDACTED]

CLINICAL DATA: Screening.

EXAM:
DIGITAL SCREENING BILATERAL MAMMOGRAM WITH TOMOSYNTHESIS AND CAD
TECHNIQUE: Bilateral screening digital craniocaudal and mediolateral oblique
mammograms were obtained. Bilateral screening digital breast
tomosynthesis was performed. The images were evaluated with
computer-aided detection.

[L MLO synth-2D]
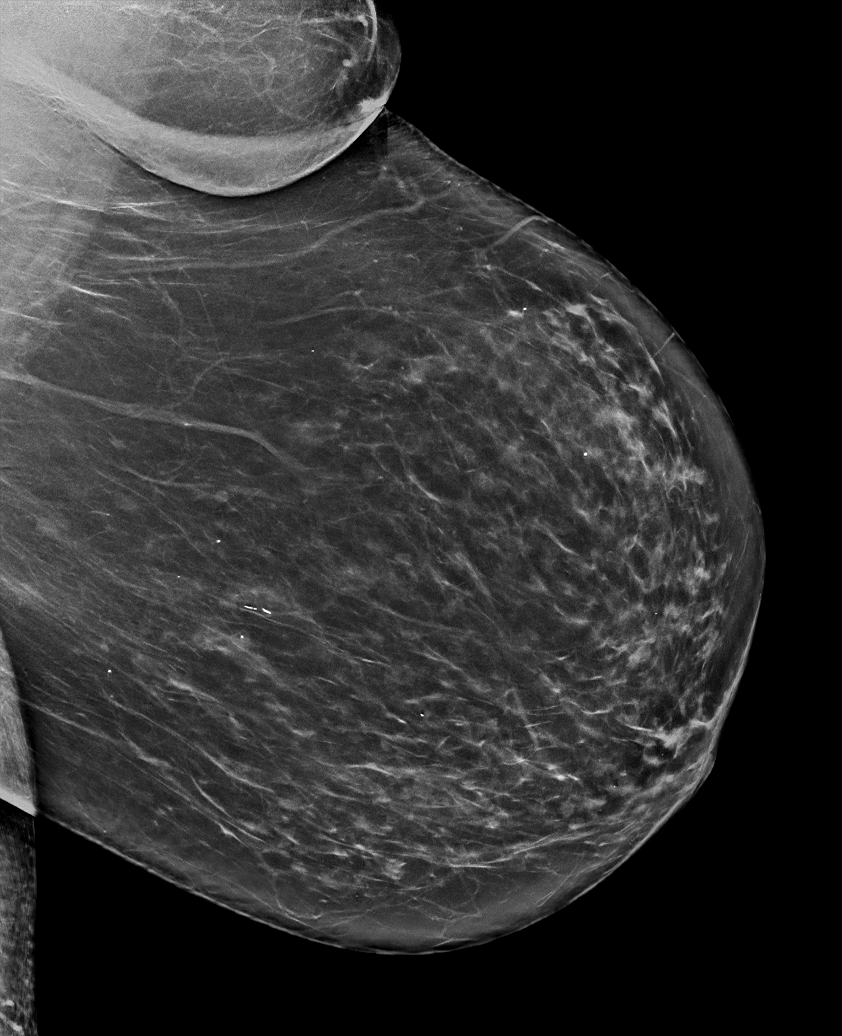

[L CC synth-2D]
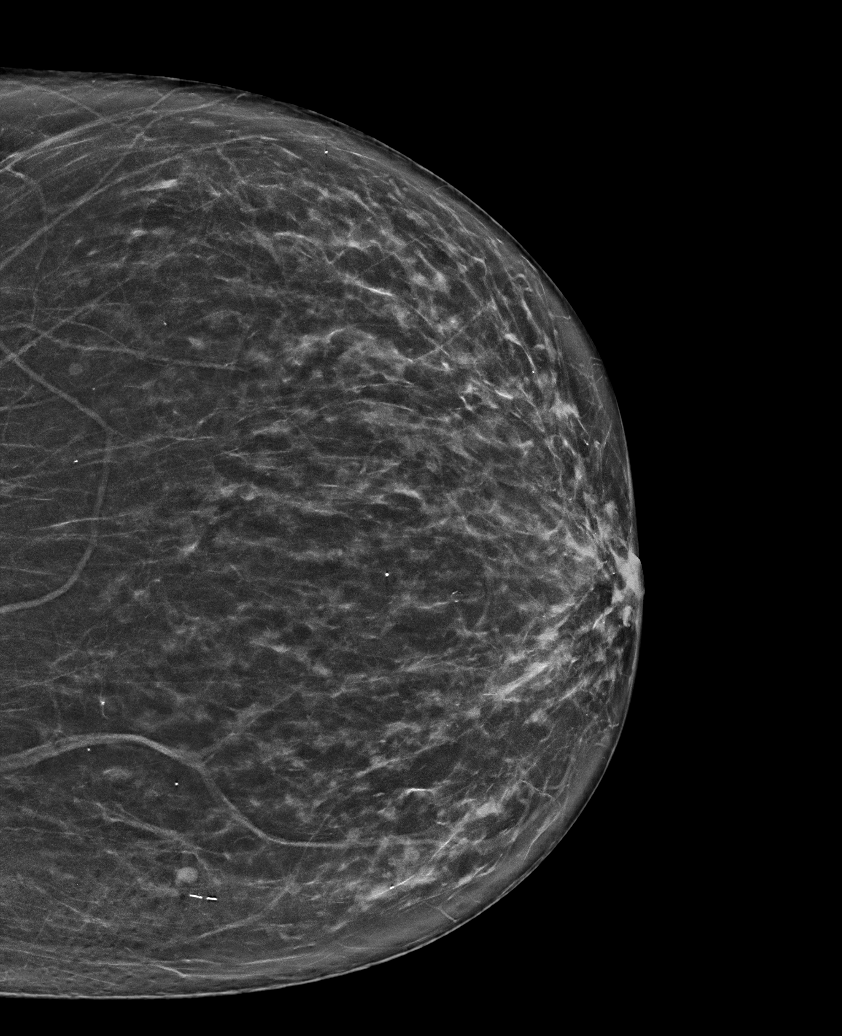

[R CC synth-2D]
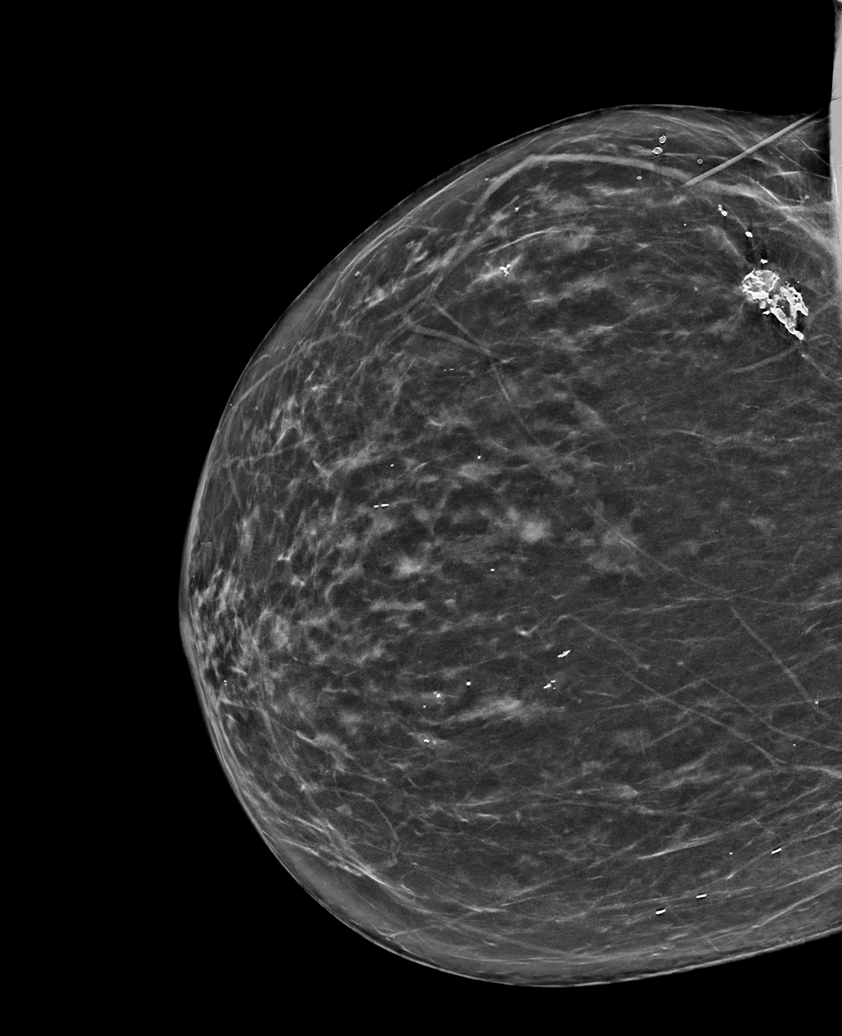

[R XCCL synth-2D]
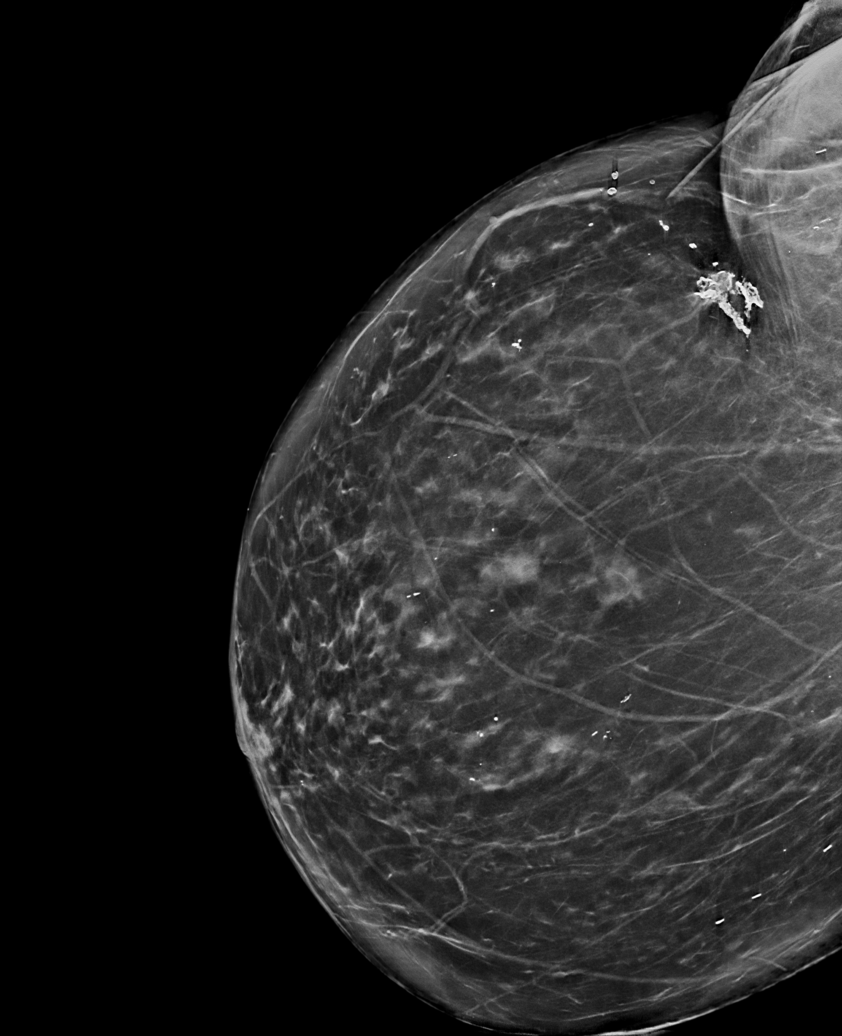

[R MLO synth-2D]
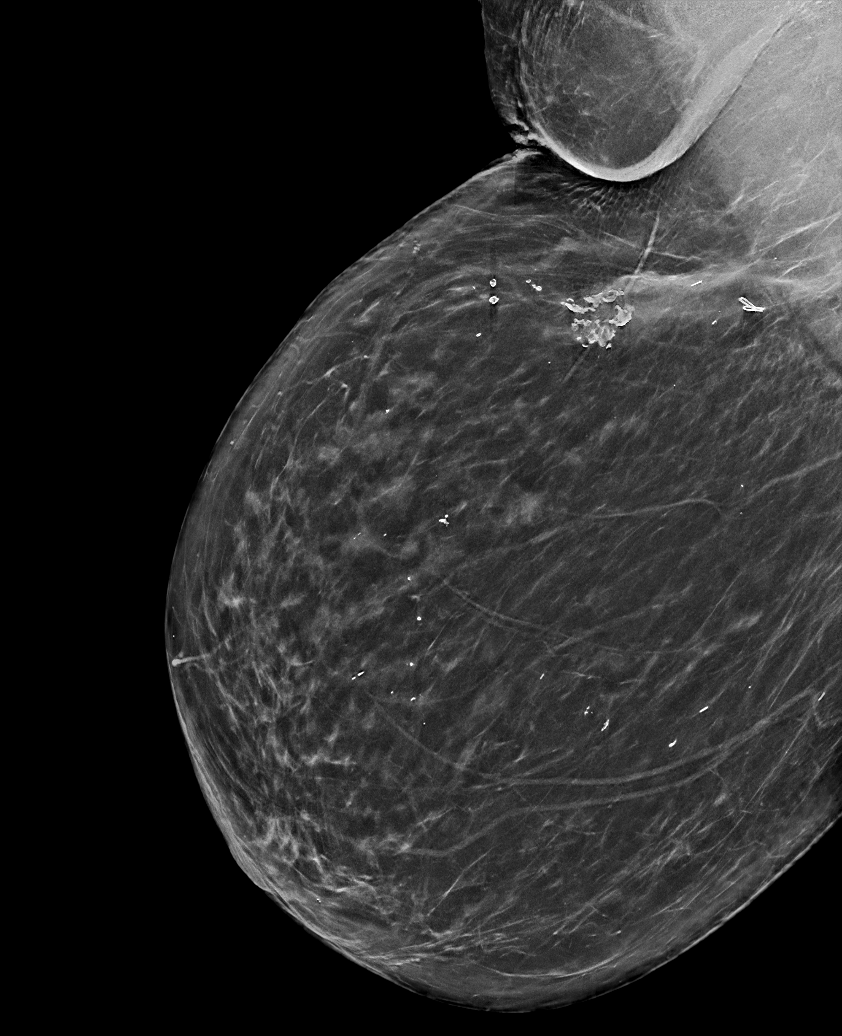

[R MLO tomo · tomo slice 43/84.0]
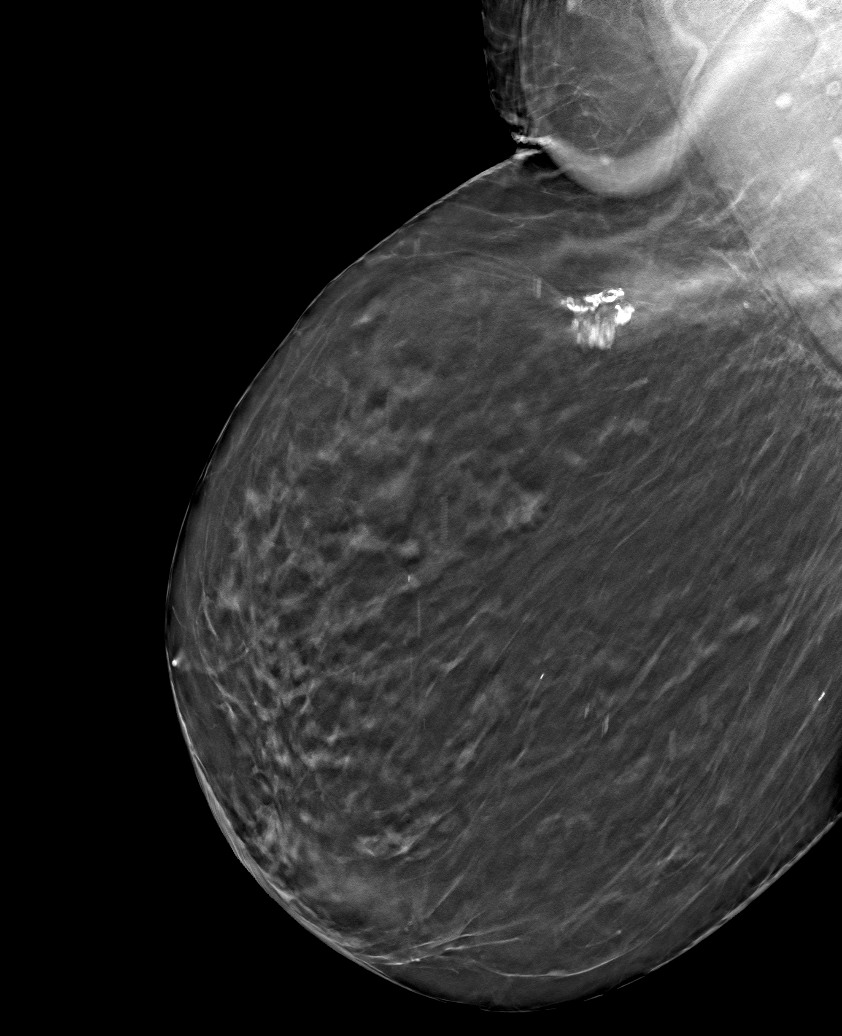

[6 of 30 positions shown; findings below may reference images not displayed]

ACR Breast Density Category b: There are scattered areas of
fibroglandular density.
FINDINGS: There are no findings suspicious for malignancy.
IMPRESSION: No mammographic evidence of malignancy. A result letter of this
screening mammogram will be mailed directly to the patient.

RECOMMENDATION:
Screening mammogram in one year. (Code:H2-4-7W0)

BI-RADS CATEGORY  1: Negative.

## 2023-08-05 NOTE — Progress Notes (Signed)
Subjective:   Emily Gray is a 78 y.o. female who presents for Medicare Annual (Subsequent) preventive examination.  Visit Complete: Virtual I connected with  Yvonne Kendall on 08/05/23 by a audio enabled telemedicine application and verified that I am speaking with the correct person using two identifiers.  Interactive audio and video telecommunications were attempted between this provider and patient, however failed, due to patient having technical difficulties OR patient did not have access to video capability.  We continued and completed visit with audio only.  Patient Location: Home  Provider Location: Home Office  I discussed the limitations of evaluation and management by telemedicine. The patient expressed understanding and agreed to proceed.  Vital Signs: Because this visit was a virtual/telehealth visit, some criteria may be missing or patient reported. Any vitals not documented were not able to be obtained and vitals that have been documented are patient reported.    Cardiac Risk Factors include: advanced age (>74men, >93 women);diabetes mellitus;hypertension     Objective:    Today's Vitals   There is no height or weight on file to calculate BMI.     08/05/2023   11:41 AM 05/27/2022    1:35 PM 11/18/2021    1:46 PM 11/14/2021    2:43 PM 08/08/2020    1:42 PM 10/18/2018    1:11 AM 10/17/2018    8:47 PM  Advanced Directives  Does Patient Have a Medical Advance Directive? No No No Yes;No No Yes No  Type of Dispensing optician of [place];Living will   Does patient want to make changes to medical advance directive?   No - Patient declined No - Patient declined  No - Patient declined   Would patient like information on creating a medical advance directive?  No - Patient declined     No - Patient declined    Current Medications (verified) Outpatient Encounter Medications as of 08/05/2023  Medication Sig   alendronate (FOSAMAX) 10 MG tablet Take 1  tablet (10 mg total) by mouth daily before breakfast.   aspirin 81 MG tablet Take 81 mg by mouth daily.   atorvastatin (LIPITOR) 20 MG tablet Take 1 tablet (20 mg total) by mouth daily.   Blood Glucose Monitoring Suppl (ONETOUCH VERIO REFLECT) w/Device KIT Use to check blood sugar TID. E11.65   cholecalciferol (VITAMIN D3) 25 MCG (1000 UT) tablet Take 1 tablet (1,000 Units total) by mouth daily.   COD LIVER OIL/VITAMINS A & D PO Take by mouth.   glucose blood (ONETOUCH VERIO) test strip Use to check blood sugar TID. E11.65   Insulin Pen Needle 32G X 4 MM MISC Use with insulin pen daily. Dx: E11.9   lisinopril-hydrochlorothiazide (ZESTORETIC) 20-12.5 MG tablet Take 2 tablets by mouth daily.   Semaglutide, 2 MG/DOSE, 8 MG/3ML SOPN Inject 2 mg as directed once a week. Dx: E11.65   VITAMIN D PO Take by mouth.   chlorhexidine (PERIDEX) 0.12 % solution SMARTSIG:1 Capful(s) By Mouth Twice Daily (Patient not taking: Reported on 08/05/2023)   Continuous Glucose Sensor (FREESTYLE LIBRE 2 SENSOR) MISC Utilize as directed q2 weeks to monitor blood glucose - E11.9 (Patient not taking: Reported on 08/05/2023)   furosemide (LASIX) 20 MG tablet TAKE 1/2 (ONE-HALF) TABLET BY MOUTH EVERY OTHER DAY FOR 7 DAYS (Patient not taking: Reported on 08/05/2023)   gabapentin (NEURONTIN) 300 MG capsule Take 1 capsule by mouth 3 (three) times daily. (Patient not taking: Reported on 08/05/2023)   ibuprofen (ADVIL) 600  MG tablet Take 1 tablet (600 mg total) by mouth every 8 (eight) hours as needed. Use sparingly (Patient not taking: Reported on 08/05/2023)   insulin aspart (NOVOLOG FLEXPEN) 100 UNIT/ML FlexPen Inject 12 Units into the skin 2 (two) times daily.   insulin glargine-yfgn (SEMGLEE) 100 UNIT/ML Pen INJECT 44 UNITS ITNO THE SKIN DAILY (Patient not taking: Reported on 08/05/2023)   letrozole (FEMARA) 2.5 MG tablet 1 tablet by oral route. (Patient not taking: Reported on 08/05/2023)   nitrofurantoin, macrocrystal-monohydrate,  (MACROBID) 100 MG capsule Take 1 capsule (100 mg total) by mouth 2 (two) times daily. (Patient not taking: Reported on 08/05/2023)   No facility-administered encounter medications on file as of 08/05/2023.    Allergies (verified) Patient has no known allergies.   History: Past Medical History:  Diagnosis Date   Acid reflux    Arthritis    Breast cancer (HCC)    Hypertension    Obesity, unspecified    Pure hyperglyceridemia    Past Surgical History:  Procedure Laterality Date   APPENDECTOMY     BILATERAL SALPINGOOPHORECTOMY     BREAST LUMPECTOMY Right    CATARACT EXTRACTION Left    EYE SURGERY Left 07/20/2008   macro-hole, filled with gel substance at [facility]   HYSTEROSCOPY WITH D & C  12/03/2004   TONSILLECTOMY     TUBAL LIGATION Bilateral    Family History  Problem Relation Age of Onset   Hypertension Mother    Cancer Father    Hypertension Father    Breast cancer Sister    Social History   Socioeconomic History   Marital status: Married    Spouse name: Not on file   Number of children: Not on file   Years of education: Not on file   Highest education level: Not on file  Occupational History   Not on file  Tobacco Use   Smoking status: Former   Smokeless tobacco: Never  Vaping Use   Vaping status: Never Used  Substance and Sexual Activity   Alcohol use: No   Drug use: No   Sexual activity: Not Currently  Other Topics Concern   Not on file  Social History Narrative   Not on file   Social Drivers of Health   Financial Resource Strain: Low Risk  (08/05/2023)   Overall Financial Resource Strain (CARDIA)    Difficulty of Paying Living Expenses: Not hard at all  Food Insecurity: No Food Insecurity (08/05/2023)   Hunger Vital Sign    Worried About Running Out of Food in the Last Year: Never true    Ran Out of Food in the Last Year: Never true  Transportation Needs: No Transportation Needs (08/05/2023)   PRAPARE - Administrator, Civil Service  (Medical): No    Lack of Transportation (Non-Medical): No  Physical Activity: Inactive (08/05/2023)   Exercise Vital Sign    Days of Exercise per Week: 0 days    Minutes of Exercise per Session: 0 min  Stress: No Stress Concern Present (08/05/2023)   [employer] of Occupational Health - Occupational Stress Questionnaire    Feeling of Stress : Not at all  Social Connections: Moderately Isolated (08/05/2023)   Social Connection and Isolation Panel [NHANES]    Frequency of Communication with Friends and Family: More than three times a week    Frequency of Social Gatherings with Friends and Family: Not on file    Attends Religious Services: Never    Active Member of Clubs or  Organizations: No    Attends Engineer, structural: Never    Marital Status: Married    Tobacco Counseling Counseling given: Not Answered   Clinical Intake:  Pre-visit preparation completed: Yes  Pain : No/denies pain     Nutritional Risks: None Diabetes: Yes CBG done?: No Did pt. bring in CBG monitor from home?: No  How often do you need to have someone help you when you read instructions, pamphlets, or other written materials from your doctor or pharmacy?: 1 - Never  Interpreter Needed?: No  Information entered by :: NAllen LPN   Activities of Daily Living    08/05/2023   11:28 AM  In your present state of health, do you have any difficulty performing the following activities:  Hearing? 0  Vision? 0  Difficulty concentrating or making decisions? 0  Walking or climbing stairs? 0  Dressing or bathing? 0  Doing errands, shopping? 0  Preparing Food and eating ? N  Using the Toilet? N  In the past six months, have you accidently leaked urine? N  Do you have problems with loss of bowel control? N  Managing your Medications? N  Managing your Finances? N  Housekeeping or managing your Housekeeping? N    Patient Care Team: Georganna Skeans, MD as PCP - General (Family  Medicine)  Indicate any recent Medical Services you may have received from other than [facility] providers in the past year (date may be approximate).     Assessment:   This is a routine wellness examination for Zuriya.  Hearing/Vision screen Hearing Screening - Comments:: Denies hearing issues Vision Screening - Comments:: [facility]   Goals Addressed             This Visit's Progress    Patient Stated       08/05/2023, live day to day and stay in right mind       Depression Screen    08/05/2023   11:44 AM 05/27/2023    1:08 PM 11/18/2022   11:53 AM 06/30/2022   10:20 AM 05/29/2022   11:24 AM 05/18/2022    9:42 AM 03/30/2022    9:56 AM  PHQ 2/9 Scores  PHQ - 2 Score 0 0 0 0 0 2 0  PHQ- 9 Score   0 0 0 8     Fall Risk    08/05/2023   11:42 AM 05/27/2023    1:09 PM 05/29/2022   11:26 AM 11/18/2021    1:24 PM 11/14/2021    2:44 PM  Fall Risk   Falls in the past year? 0 0 0 0 0  Number falls in past yr: 0 0 0 0 0  Injury with Fall? 0 0 0 1 0  Risk for fall due to : Medication side effect;Impaired mobility;Impaired balance/gait No Fall Risks No Fall Risks No Fall Risks History of fall(s)  Follow up Falls prevention discussed;Falls evaluation completed Falls evaluation completed Falls evaluation completed Falls prevention discussed Falls evaluation completed    MEDICARE RISK AT HOME: Medicare Risk at Home Any stairs in or around the home?: Yes If so, are there any without handrails?: No Home free of loose throw rugs in walkways, pet beds, electrical cords, etc?: Yes Adequate lighting in your home to reduce risk of falls?: Yes Life alert?: No Use of a cane, walker or w/c?: Yes Grab bars in the bathroom?: Yes Shower chair or bench in shower?: Yes Elevated toilet seat or a handicapped toilet?: No  TIMED UP AND GO:  Was the test performed?  No    Cognitive Function:        08/05/2023   11:44 AM 05/29/2022   11:31 AM 05/29/2022   11:27 AM 11/14/2021     2:46 PM  6CIT Screen  What Year? 0 points 0 points 0 points 0 points  What month? 0 points 0 points 0 points 0 points  What time? 0 points 0 points 0 points 0 points  Count back from 20 0 points 0 points 0 points 0 points  Months in reverse 4 points 4 points  4 points  Repeat phrase 2 points 8 points 6 points 8 points  Total Score 6 points 12 points  12 points    Immunizations Immunization History  Administered Date(s) Administered   Pneumococcal Polysaccharide-23 10/18/2018    TDAP status: Due, Education has been provided regarding the importance of this vaccine. Advised may receive this vaccine at local pharmacy or Health Dept. Aware to provide a copy of the vaccination record if obtained from local pharmacy or Health Dept. Verbalized acceptance and understanding.  Flu Vaccine status: Declined, Education has been provided regarding the importance of this vaccine but patient still declined. Advised may receive this vaccine at local pharmacy or Health Dept. Aware to provide a copy of the vaccination record if obtained from local pharmacy or Health Dept. Verbalized acceptance and understanding.  Pneumococcal vaccine status: Declined,  Education has been provided regarding the importance of this vaccine but patient still declined. Advised may receive this vaccine at local pharmacy or Health Dept. Aware to provide a copy of the vaccination record if obtained from local pharmacy or Health Dept. Verbalized acceptance and understanding.   Covid-19 vaccine status: Declined, Education has been provided regarding the importance of this vaccine but patient still declined. Advised may receive this vaccine at local pharmacy or Health Dept.or vaccine clinic. Aware to provide a copy of the vaccination record if obtained from local pharmacy or Health Dept. Verbalized acceptance and understanding.  Qualifies for Shingles Vaccine? Yes   Zostavax completed No   Shingrix Completed?: No.    Education has  been provided regarding the importance of this vaccine. Patient has been advised to call insurance company to determine out of pocket expense if they have not yet received this vaccine. Advised may also receive vaccine at local pharmacy or Health Dept. Verbalized acceptance and understanding.  Screening Tests Health Maintenance  Topic Date Due   OPHTHALMOLOGY EXAM  Never done   HEMOGLOBIN A1C  11/24/2023   Diabetic kidney evaluation - eGFR measurement  12/21/2023   FOOT EXAM  01/04/2024   Diabetic kidney evaluation - Urine ACR  05/26/2024   Medicare Annual Wellness (AWV)  08/04/2024   DEXA SCAN  Completed   Hepatitis C Screening  Completed   HPV VACCINES  Aged Out   DTaP/Tdap/Td  Discontinued   Pneumonia Vaccine 45+ Years old  Discontinued   INFLUENZA VACCINE  Discontinued   Colonoscopy  Discontinued   COVID-19 Vaccine  Discontinued   Zoster Vaccines- Shingrix  Discontinued    Health Maintenance  Health Maintenance Due  Topic Date Due   OPHTHALMOLOGY EXAM  Never done    Colorectal cancer screening: No longer required.   Mammogram status: Completed 01/11/2023. Repeat every year  Bone Density status: Completed 07/20/2018.   Lung Cancer Screening: (Low Dose CT Chest recommended if Age 54-80 years, 20 pack-year currently smoking OR have quit w/in 15years.) does not qualify.   Lung  Cancer Screening Referral: no  Additional Screening:  Hepatitis C Screening: does qualify; Completed 05/08/2020  Vision Screening: Recommended annual ophthalmology exams for early detection of glaucoma and other disorders of the eye. Is the patient up to date with their annual eye exam?  Yes  Who is the provider or what is the name of the office in which the patient attends annual eye exams? [facility] If pt is not established with a provider, would they like to be referred to a provider to establish care? No .   Dental Screening: Recommended annual dental exams for proper oral hygiene  Diabetic  Foot Exam: Diabetic Foot Exam: Completed 01/04/2023  Community Resource Referral / Chronic Care Management: CRR required this visit?  No   CCM required this visit?  No     Plan:     I have personally reviewed and noted the following in the patient's chart:   Medical and social history Use of alcohol, tobacco or illicit drugs  Current medications and supplements including opioid prescriptions. Patient is not currently taking opioid prescriptions. Functional ability and status Nutritional status Physical activity Advanced directives List of other physicians Hospitalizations, surgeries, and ER visits in previous 12 months Vitals Screenings to include cognitive, depression, and falls Referrals and appointments  In addition, I have reviewed and discussed with patient certain preventive protocols, quality metrics, and best practice recommendations. A written personalized care plan for preventive services as well as general preventive health recommendations were provided to patient.     Barb Merino, LPN   6/57/8469   After Visit Summary: (MyChart) Due to this being a telephonic visit, the after visit summary with patients personalized plan was offered to patient via MyChart   Nurse Notes: none

## 2023-08-05 NOTE — Patient Instructions (Signed)
Emily Gray , Thank you for taking time to come for your Medicare Wellness Visit. I appreciate your ongoing commitment to your health goals. Please review the following plan we discussed and let me know if I can assist you in the future.   Referrals/Orders/Follow-Ups/Clinician Recommendations: none  This is a list of the screening recommended for you and due dates:  Health Maintenance  Topic Date Due   Eye exam for diabetics  Never done   Hemoglobin A1C  11/24/2023   Yearly kidney function blood test for diabetes  12/21/2023   Complete foot exam   01/04/2024   Yearly kidney health urinalysis for diabetes  05/26/2024   Medicare Annual Wellness Visit  08/04/2024   DEXA scan (bone density measurement)  Completed   Hepatitis C Screening  Completed   HPV Vaccine  Aged Out   DTaP/Tdap/Td vaccine  Discontinued   Pneumonia Vaccine  Discontinued   Flu Shot  Discontinued   Colon Cancer Screening  Discontinued   COVID-19 Vaccine  Discontinued   Zoster (Shingles) Vaccine  Discontinued    Advanced directives: (ACP Link)Information on Advanced Care Planning can be found at [facility] Advance Health Care Directives Advance Health Care Directives ([email or web address]   Next Medicare Annual Wellness Visit scheduled for next year: Yes  insert Preventive Care attachment Insert FALL PREVENTION attachment if needed

## 2023-09-13 ENCOUNTER — Other Ambulatory Visit: Payer: Self-pay | Admitting: Family Medicine

## 2023-09-13 DIAGNOSIS — Z794 Long term (current) use of insulin: Secondary | ICD-10-CM

## 2023-09-13 NOTE — Telephone Encounter (Signed)
 Copied from CRM [id number]. Topic: Clinical - Medication Refill >> Sep 13, 2023 11:02 AM Everette C wrote: Most Recent Primary Care Visit:  Provider: Barb Merino  [facility]  Visit Type: MEDICARE AWV, SEQUENTIAL  Date: 08/05/2023  Medication: Semaglutide, 2 MG/DOSE, 8 MG/3ML SOPN [[id number]]  Has the patient contacted their pharmacy? Yes (Agent: If no, request that the patient contact the pharmacy for the refill. If patient does not wish to contact the pharmacy document the reason why and proceed with request.) (Agent: If yes, when and what did the pharmacy advise?)  Is this the correct pharmacy for this prescription? Yes If no, delete pharmacy and type the correct one.  This is the patient's preferred pharmacy:  [facility] - [place], [place] - [address] [facility] [address] [place] [address] [place] [address] Phone: [phone] Fax: [phone]  [facility] [address] [place]), [place] - [address] [place]) [place] [address] Phone: [phone] Fax: [phone]   Has the prescription been filled recently? Yes  Is the patient out of the medication? No  Has the patient been seen for an appointment in the last year OR does the patient have an upcoming appointment? Yes  Can we respond through MyChart? No  Agent: Please be advised that Rx refills may take up to 3 business days. We ask that you follow-up with your pharmacy.

## 2023-09-30 MED ORDER — SEMAGLUTIDE (2 MG/DOSE) 8 MG/3ML ~~LOC~~ SOPN: 2.0000 mg | PEN_INJECTOR | SUBCUTANEOUS | 1 refills | Status: DC

## 2023-10-05 NOTE — Telephone Encounter (Signed)
 Pt is stating that the pharmacy is correct for this rx but they are saying that they have not gotten it. Could you please check on the rx and possibly refax it over.

## 2023-10-15 ENCOUNTER — Other Ambulatory Visit: Payer: Self-pay | Admitting: Family Medicine

## 2023-10-15 DIAGNOSIS — E782 Mixed hyperlipidemia: Secondary | ICD-10-CM

## 2023-10-15 DIAGNOSIS — I10 Essential (primary) hypertension: Secondary | ICD-10-CM

## 2023-10-15 NOTE — Telephone Encounter (Signed)
 Copied from CRM [id number]. Topic: Clinical - Medication Refill >> Oct 15, 2023 11:08 AM Shelah Lewandowsky wrote: Most Recent Primary Care Visit:  Provider: Barb Merino  [facility]  Visit Type: MEDICARE AWV, SEQUENTIAL  Date: 08/05/2023  Medication: atorvastatin (LIPITOR) 20 MG tablet lisinopril-hydrochlorothiazide (ZESTORETIC) 20-12.5 MG tablet  Has the patient contacted their pharmacy? Yes (Agent: If no, request that the patient contact the pharmacy for the refill. If patient does not wish to contact the pharmacy document the reason why and proceed with request.) (Agent: If yes, when and what did the pharmacy advise?)  Is this the correct pharmacy for this prescription? Yes If no, delete pharmacy and type the correct one.  This is the patient's preferred pharmacy:  [facility] - [place], [place] - [address] [facility] [address] [place] [address] [place] [address] Phone: [phone] Fax: [phone]   Has the prescription been filled recently? Yes  Is the patient out of the medication? No  Has the patient been seen for an appointment in the last year OR does the patient have an upcoming appointment? Yes  Can we respond through MyChart? Yes  Agent: Please be advised that Rx refills may take up to 3 business days. We ask that you follow-up with your pharmacy.

## 2023-10-21 MED ORDER — ATORVASTATIN CALCIUM 20 MG PO TABS
20.0000 mg | ORAL_TABLET | Freq: Every day | ORAL | 1 refills | Status: DC
Start: 2023-10-21 — End: 2024-02-18

## 2023-10-21 MED ORDER — LISINOPRIL-HYDROCHLOROTHIAZIDE 20-12.5 MG PO TABS
2.0000 | ORAL_TABLET | Freq: Every day | ORAL | 1 refills | Status: AC
Start: 2023-10-21 — End: ?

## 2023-11-03 ENCOUNTER — Other Ambulatory Visit: Payer: Self-pay | Admitting: Family Medicine

## 2023-11-03 NOTE — Telephone Encounter (Unsigned)
 Copied from CRM [id number]. Topic: Clinical - Medication Refill >> Nov 03, 2023  3:38 PM Georgeann Kindred wrote: Most Recent Primary Care Visit:  Provider: ALLEN, NICKEAH E  [facility]: [facility]  Visit Type: MEDICARE AWV, SEQUENTIAL  Date: 08/05/2023  Medication: alendronate (FOSAMAX) 10 MG tablet  Has the patient contacted their pharmacy? Yes (Agent: If no, request that the patient contact the pharmacy for the refill. If patient does not wish to contact the pharmacy document the reason why and proceed with request.) (Agent: If yes, when and what did the pharmacy advise?) Pharmacy advised patient to call provider for refill  Is this the correct pharmacy for this prescription? Yes If no, delete pharmacy and type the correct one.  This is the patient's preferred pharmacy:  [facility] - [place], [place] - [address] [facility] [address] [place] [address] [place] [address] Phone: [phone] Fax: [phone]   Has the prescription been filled recently? No  Is the patient out of the medication? No  Has the patient been seen for an appointment in the last year OR does the patient have an upcoming appointment? Yes  Can we respond through MyChart? Yes  Agent: Please be advised that Rx refills may take up to 3 business days. We ask that you follow-up with your pharmacy.

## 2023-11-04 MED ORDER — ALENDRONATE SODIUM 10 MG PO TABS: 10.0000 mg | ORAL_TABLET | Freq: Every day | ORAL | 0 refills | Status: DC

## 2023-11-04 NOTE — Telephone Encounter (Signed)
 Requested Prescriptions  Pending Prescriptions Disp Refills   alendronate (FOSAMAX) 10 MG tablet 90 tablet 0    Sig: Take 1 tablet (10 mg total) by mouth daily before breakfast.     Endocrinology:  Bisphosphonates Failed - 11/04/2023  2:06 PM      Failed - Vitamin D in normal range and within 360 days    No results found for: "ZO1096EA5", "WU9811BJ4", "[id number]", "25OHVITD3", "25OHVITD2", "25OHVITD1", "VD25OH"       Failed - Cr in normal range and within 360 days    Creatinine, Ser  Date Value Ref Range Status  12/21/2022 1.07 (H) 0.44 - 1.00 mg/dL Final         Failed - Mg Level in normal range and within 360 days    Magnesium  Date Value Ref Range Status  10/18/2018 2.1 1.7 - 2.4 mg/dL Final    Comment:    Performed at [facility], [address] [place], [place] [address]         Failed - Phosphate in normal range and within 360 days    Phosphorus  Date Value Ref Range Status  10/18/2018 2.6 2.5 - 4.6 mg/dL Final    Comment:    Performed at [facility], [address] [place], [place] [address]         Failed - Bone Mineral Density or Dexa Scan completed in the last 2 years      Passed - Ca in normal range and within 360 days    Calcium  Date Value Ref Range Status  12/21/2022 9.1 8.9 - 10.3 mg/dL Final         Passed - eGFR is 30 or above and within 360 days    GFR calc Af Amer  Date Value Ref Range Status  08/08/2020 65 >59 mL/min/1.73 Final    Comment:    **In accordance with recommendations from the NKF-ASN Task force,**   [facility] is in the process of updating its eGFR calculation to the   2021 CKD-EPI creatinine equation that estimates kidney function   without a race variable.    GFR, Estimated  Date Value Ref Range Status  12/21/2022 54 (L) >60 mL/min Final    Comment:    (NOTE) Calculated using the CKD-EPI Creatinine Equation (2021)    eGFR  Date Value Ref Range Status  03/30/2022 77 >59 mL/min/1.73 Final         Passed -  Valid encounter within last 12 months    Recent Outpatient Visits           5 months ago Type 2 diabetes mellitus with hyperglycemia, with long-term current use of insulin (HCC)   [place] [facility], Lauris Poag, MD   10 months ago Type 2 diabetes mellitus with hyperglycemia, with long-term current use of insulin (HCC)   [place] [facility], MD   11 months ago Acute cystitis without hematuria   [place] [facility], Lauris Poag, MD   1 year ago Type 2 diabetes mellitus with hyperglycemia, with long-term current use of insulin [facility])   [place] [facility], Lauris Poag, MD   1 year ago Type 2 diabetes mellitus with hyperglycemia, with long-term current use of insulin (HCC)   [facility] [place] Drucilla Chalet, RPH-CPP       Future  Appointments             In 2 weeks Abraham Abo, MD [facility]

## 2023-11-24 ENCOUNTER — Encounter: Payer: Self-pay | Admitting: Family Medicine

## 2023-11-24 ENCOUNTER — Ambulatory Visit (INDEPENDENT_AMBULATORY_CARE_PROVIDER_SITE_OTHER): Payer: Medicare Other | Admitting: Family Medicine

## 2023-11-24 VITALS — BP 136/79 | HR 78 | Wt 231.0 lb

## 2023-11-24 DIAGNOSIS — E782 Mixed hyperlipidemia: Secondary | ICD-10-CM

## 2023-11-24 DIAGNOSIS — I10 Essential (primary) hypertension: Secondary | ICD-10-CM

## 2023-11-24 DIAGNOSIS — Z7985 Long-term (current) use of injectable non-insulin antidiabetic drugs: Secondary | ICD-10-CM | POA: Diagnosis not present

## 2023-11-24 DIAGNOSIS — Z6839 Body mass index (BMI) 39.0-39.9, adult: Secondary | ICD-10-CM

## 2023-11-24 DIAGNOSIS — Z794 Long term (current) use of insulin: Secondary | ICD-10-CM | POA: Diagnosis not present

## 2023-11-24 DIAGNOSIS — E66812 Obesity, class 2: Secondary | ICD-10-CM

## 2023-11-24 DIAGNOSIS — E1165 Type 2 diabetes mellitus with hyperglycemia: Secondary | ICD-10-CM | POA: Diagnosis not present

## 2023-11-24 LAB — POCT GLYCOSYLATED HEMOGLOBIN (HGB A1C): HbA1c, POC (controlled diabetic range): 6.6 % (ref 0.0–7.0)

## 2023-11-24 NOTE — Progress Notes (Unsigned)
 Established Patient Office Visit  Subjective    Patient ID: Emily Gray, female    DOB: 06/01/1946  Age: 78 y.o. MRN: [id number]  CC:  Chief Complaint  Patient presents with   Medical Management of Chronic Issues    HPI Emily Gray presents for routine follow up of chronic med issues including hypertension and diabetes. Patient reports med compliance and denies acute complaints.   Outpatient Encounter Medications as of 11/24/2023  Medication Sig   alendronate  (FOSAMAX ) 10 MG tablet Take 1 tablet (10 mg total) by mouth daily before breakfast.   aspirin  81 MG tablet Take 81 mg by mouth daily.   atorvastatin  (LIPITOR) 20 MG tablet Take 1 tablet (20 mg total) by mouth daily.   Blood Glucose Monitoring Suppl (ONETOUCH VERIO REFLECT) w/Device KIT Use to check blood sugar TID. E11.65   chlorhexidine (PERIDEX) 0.12 % solution    cholecalciferol  (VITAMIN D3) 25 MCG (1000 UT) tablet Take 1 tablet (1,000 Units total) by mouth daily.   COD LIVER OIL/VITAMINS A & D PO Take by mouth.   Continuous Glucose Sensor (FREESTYLE LIBRE 2 SENSOR) MISC Utilize as directed q2 weeks to monitor blood glucose - E11.9   furosemide (LASIX) 20 MG tablet    gabapentin  (NEURONTIN ) 300 MG capsule Take 1 capsule by mouth 3 (three) times daily.   glucose blood (ONETOUCH VERIO) test strip Use to check blood sugar TID. E11.65   ibuprofen  (ADVIL ) 600 MG tablet Take 1 tablet (600 mg total) by mouth every 8 (eight) hours as needed. Use sparingly   insulin  glargine-yfgn (SEMGLEE ) 100 UNIT/ML Pen    Insulin  Pen Needle 32G X 4 MM MISC Use with insulin  pen daily. Dx: E11.9   letrozole  (FEMARA ) 2.5 MG tablet    lisinopril -hydrochlorothiazide  (ZESTORETIC ) 20-12.5 MG tablet Take 2 tablets by mouth daily.   nitrofurantoin , macrocrystal-monohydrate, (MACROBID ) 100 MG capsule Take 1 capsule (100 mg total) by mouth 2 (two) times daily.   Semaglutide , 2 MG/DOSE, 8 MG/3ML SOPN Inject 2 mg as directed once a week. Dx: E11.65    VITAMIN D  PO Take by mouth.   insulin  aspart (NOVOLOG  FLEXPEN) 100 UNIT/ML FlexPen Inject 12 Units into the skin 2 (two) times daily.   No facility-administered encounter medications on file as of 11/24/2023.    Past Medical History:  Diagnosis Date   Acid reflux    Arthritis    Breast cancer (HCC)    Hypertension    Obesity, unspecified    Pure hyperglyceridemia     Past Surgical History:  Procedure Laterality Date   APPENDECTOMY     BILATERAL SALPINGOOPHORECTOMY     BREAST LUMPECTOMY Right    CATARACT EXTRACTION Left    EYE SURGERY Left 07/20/2008   macro-hole, filled with gel substance at [facility]   HYSTEROSCOPY WITH D & C  12/03/2004   TONSILLECTOMY     TUBAL LIGATION Bilateral     Family History  Problem Relation Age of Onset   Hypertension Mother    Cancer Father    Hypertension Father    Breast cancer Sister     Social History   Socioeconomic History   Marital status: Married    Spouse name: Not on file   Number of children: Not on file   Years of education: Not on file   Highest education level: Not on file  Occupational History   Not on file  Tobacco Use   Smoking status: Former   Smokeless tobacco: Never  Vaping Use  Vaping status: Never Used  Substance and Sexual Activity   Alcohol use: No   Drug use: No   Sexual activity: Not Currently  Other Topics Concern   Not on file  Social History Narrative   Not on file   Social Drivers of Health   Financial Resource Strain: Low Risk  (08/05/2023)   Overall Financial Resource Strain (CARDIA)    Difficulty of Paying Living Expenses: Not hard at all  Food Insecurity: No Food Insecurity (08/05/2023)   Hunger Vital Sign    Worried About Running Out of Food in the Last Year: Never true    Ran Out of Food in the Last Year: Never true  Transportation Needs: No Transportation Needs (08/05/2023)   PRAPARE - Administrator, Civil Service (Medical): No    Lack of Transportation (Non-Medical): No   Physical Activity: Inactive (08/05/2023)   Exercise Vital Sign    Days of Exercise per Week: 0 days    Minutes of Exercise per Session: 0 min  Stress: No Stress Concern Present (08/05/2023)   [employer] of Occupational Health - Occupational Stress Questionnaire    Feeling of Stress : Not at all  Social Connections: Moderately Isolated (08/05/2023)   Social Connection and Isolation Panel [NHANES]    Frequency of Communication with Friends and Family: More than three times a week    Frequency of Social Gatherings with Friends and Family: Not on file    Attends Religious Services: Never    Database administrator or Organizations: No    Attends Banker Meetings: Never    Marital Status: Married  Catering manager Violence: Not At Risk (08/05/2023)   Humiliation, Afraid, Rape, and Kick questionnaire    Fear of Current or Ex-Partner: No    Emotionally Abused: No    Physically Abused: No    Sexually Abused: No    Review of Systems  All other systems reviewed and are negative.       Objective    BP 136/79 (BP Location: Right Arm, Patient Position: Sitting, Cuff Size: Large)   Pulse 78   Wt 231 lb (104.8 kg)   SpO2 96%   BMI 39.65 kg/m   Physical Exam Vitals and nursing note reviewed.  Constitutional:      General: She is not in acute distress. Cardiovascular:     Rate and Rhythm: Normal rate and regular rhythm.  Pulmonary:     Effort: Pulmonary effort is normal.     Breath sounds: Normal breath sounds.  Musculoskeletal:     Comments: Patient utilizing cane for stability  Neurological:     General: No focal deficit present.     Mental Status: She is alert and oriented to person, place, and time.         Assessment & Plan:   Type 2 diabetes mellitus with hyperglycemia, with long-term current use of insulin  (HCC) -     POCT glycosylated hemoglobin (Hb A1C)  Primary hypertension  Mixed hyperlipidemia  Long-term current use of injectable  noninsulin antidiabetic medication  Class 2 severe obesity due to excess calories with serious comorbidity and body mass index (BMI) of 39.0 to 39.9 in adult [facility])     Return in about 6 months (around 05/26/2024) for follow up, chronic med issues.   Arlo Lama, MD

## 2023-11-25 ENCOUNTER — Encounter: Payer: Self-pay | Admitting: Family Medicine

## 2023-12-08 ENCOUNTER — Other Ambulatory Visit: Payer: Self-pay | Admitting: Family Medicine

## 2023-12-08 DIAGNOSIS — Z1231 Encounter for screening mammogram for malignant neoplasm of breast: Secondary | ICD-10-CM

## 2024-02-18 ENCOUNTER — Other Ambulatory Visit: Payer: Self-pay | Admitting: Family Medicine

## 2024-02-18 DIAGNOSIS — E782 Mixed hyperlipidemia: Secondary | ICD-10-CM

## 2024-02-18 NOTE — Telephone Encounter (Signed)
 Requested medications are due for refill today.  Fosamax  yes - Atorvastatin  no  Requested medications are on the active medications list.  yes  Last refill. Fosamax  11/04/2023 #90 0 rf. Atorvastatin  10/21/2023 #90 1 rf  Future visit scheduled.   yes  Notes to clinic.  Labs are expired.    Requested Prescriptions  Pending Prescriptions Disp Refills   alendronate  (FOSAMAX ) 10 MG tablet 90 tablet 0    Sig: Take 1 tablet (10 mg total) by mouth daily before breakfast.     Endocrinology:  Bisphosphonates Failed - 02/18/2024  3:39 PM      Failed - Ca in normal range and within 360 days    Calcium   Date Value Ref Range Status  12/21/2022 9.1 8.9 - 10.3 mg/dL Final         Failed - Vitamin D  in normal range and within 360 days    No results found for: [id number], CI6874NY7, [id number], 25OHVITD3, 25OHVITD2, 25OHVITD1, VD25OH       Failed - Cr in normal range and within 360 days    Creatinine, Ser  Date Value Ref Range Status  12/21/2022 1.07 (H) 0.44 - 1.00 mg/dL Final         Failed - Mg Level in normal range and within 360 days    Magnesium  Date Value Ref Range Status  10/18/2018 2.1 1.7 - 2.4 mg/dL Final    Comment:    Performed at [facility], [address] [place], [place] [address]         Failed - Phosphate in normal range and within 360 days    Phosphorus  Date Value Ref Range Status  10/18/2018 2.6 2.5 - 4.6 mg/dL Final    Comment:    Performed at [facility], [address], [place], [place] [address]         Failed - eGFR is 30 or above and within 360 days    GFR calc Af Amer  Date Value Ref Range Status  08/08/2020 65 >59 mL/min/1.73 Final    Comment:    **In accordance with recommendations from the NKF-ASN Task force,**   [facility] is in the process of updating its eGFR calculation to the   2021 CKD-EPI creatinine equation that estimates kidney function   without a race variable.    GFR, Estimated  Date Value Ref Range Status   12/21/2022 54 (L) >60 mL/min Final    Comment:    (NOTE) Calculated using the CKD-EPI Creatinine Equation (2021)    eGFR  Date Value Ref Range Status  03/30/2022 77 >59 mL/min/1.73 Final         Failed - Bone Mineral Density or Dexa Scan completed in the last 2 years      Passed - Valid encounter within last 12 months    Recent Outpatient Visits           2 months ago Type 2 diabetes mellitus with hyperglycemia, with long-term current use of insulin  (HCC)   [place] [facility], [place], MD   8 months ago Type 2 diabetes mellitus with hyperglycemia, with long-term current use of insulin  [facility])   [place] [facility], Raguel, MD   1 year ago Type 2 diabetes mellitus with hyperglycemia, with long-term current use of insulin  [facility])   [place] [facility], MD   1 year ago Acute cystitis without hematuria   [facility], Raguel, MD   1 year ago Type 2 diabetes mellitus with hyperglycemia, with long-term current use of insulin  [facility])   [place] [facility], Raguel, MD               atorvastatin  (LIPITOR) 20 MG tablet 90 tablet 1    Sig: Take 1 tablet (20 mg total) by mouth daily.     Cardiovascular:  Antilipid - Statins Failed - 02/18/2024  3:39 PM      Failed - Lipid Panel in normal range within the last 12 months    Cholesterol, Total  Date Value Ref Range Status  03/30/2022 133 100 - 199 mg/dL Final   LDL Chol Calc (NIH)  Date Value Ref Range Status  03/30/2022 82 0 - 99 mg/dL Final   LDL Direct  Date Value Ref Range Status  08/08/2020 85 0 - 99 mg/dL Final   HDL  Date Value Ref Range Status  03/30/2022 39 (L) >39 mg/dL Final   Triglycerides  Date Value Ref Range Status  03/30/2022 52 0 - 149 mg/dL Final         Passed - Patient is not pregnant      Passed - Valid encounter within last 12 months     Recent Outpatient Visits           2 months ago Type 2 diabetes mellitus with hyperglycemia, with long-term current use of insulin  (HCC)   [place] [facility], Raguel, MD   8 months ago Type 2 diabetes mellitus with hyperglycemia, with long-term current use of insulin  [facility])   [place] [facility], Raguel, MD   1 year ago Type 2 diabetes mellitus with hyperglycemia, with long-term current use of insulin  [facility])   [place] [facility] at [facility], MD   1 year ago Acute cystitis without hematuria   [place] [facility], Raguel, MD   1 year ago Type 2 diabetes mellitus with hyperglycemia, with long-term current use of insulin  [facility])    Primary Care at [facility], MD

## 2024-02-18 NOTE — Telephone Encounter (Signed)
 Copied from CRM [id number]. Topic: Clinical - Medication Refill >> Feb 18, 2024 11:27 AM Mercer PEDLAR wrote: Medication:  alendronate  (FOSAMAX ) 10 MG tablet atorvastatin  (LIPITOR) 20 MG tablet   Has the patient contacted their pharmacy? Yes (Agent: If no, request that the patient contact the pharmacy for the refill. If patient does not wish to contact the pharmacy document the reason why and proceed with request.) (Agent: If yes, when and what did the pharmacy advise?)  This is the patient's preferred pharmacy:  [facility] - [place], [place] - [address] [facility] [address] [place] [address] [place] [address] Phone: [phone] Fax: [phone]  Is this the correct pharmacy for this prescription? Yes If no, delete pharmacy and type the correct one.   Has the prescription been filled recently? No  Is the patient out of the medication? No  Has the patient been seen for an appointment in the last year OR does the patient have an upcoming appointment? Yes  Can we respond through MyChart? No  Agent: Please be advised that Rx refills may take up to 3 business days. We ask that you follow-up with your pharmacy.

## 2024-02-21 MED ORDER — ALENDRONATE SODIUM 10 MG PO TABS: 10.0000 mg | ORAL_TABLET | Freq: Every day | ORAL | 0 refills | Status: DC

## 2024-02-21 MED ORDER — ATORVASTATIN CALCIUM 20 MG PO TABS
20.0000 mg | ORAL_TABLET | Freq: Every day | ORAL | 1 refills | Status: DC
Start: 2024-02-21 — End: 2024-06-14

## 2024-03-01 ENCOUNTER — Other Ambulatory Visit: Payer: Self-pay | Admitting: Family Medicine

## 2024-03-01 DIAGNOSIS — E1165 Type 2 diabetes mellitus with hyperglycemia: Secondary | ICD-10-CM

## 2024-03-01 DIAGNOSIS — Z794 Long term (current) use of insulin: Secondary | ICD-10-CM

## 2024-03-01 NOTE — Telephone Encounter (Signed)
 Copied from CRM [id number]. Topic: Clinical - Medication Refill >> Mar 01, 2024  3:17 PM Laurier C wrote: Medication: Semaglutide , 2 MG/DOSE, 8 MG/3ML SOPN   Has the patient contacted their pharmacy? Yes (Agent: If no, request that the patient contact the pharmacy for the refill. If patient does not wish to contact the pharmacy document the reason why and proceed with request.) (Agent: If yes, when and what did the pharmacy advise?)  This is the patient's preferred pharmacy:  [facility] - [place], [place] - [address] [facility] [address] [place] [address] [place] [address] Phone: [phone] Fax: [phone]  Is this the correct pharmacy for this prescription? Yes If no, delete pharmacy and type the correct one.   Has the prescription been filled recently? No  Is the patient out of the medication? Yes  Has the patient been seen for an appointment in the last year OR does the patient have an upcoming appointment? Yes  Can we respond through MyChart? Yes  Agent: Please be advised that Rx refills may take up to 3 business days. We ask that you follow-up with your pharmacy.

## 2024-03-03 MED ORDER — SEMAGLUTIDE (2 MG/DOSE) 8 MG/3ML ~~LOC~~ SOPN
2.0000 mg | PEN_INJECTOR | SUBCUTANEOUS | 0 refills | Status: DC
Start: 2024-03-03 — End: 2024-03-14

## 2024-03-03 NOTE — Telephone Encounter (Signed)
 Appt 05/26/24- will need refill- 1 month early now Requested Prescriptions  Pending Prescriptions Disp Refills   Semaglutide , 2 MG/DOSE, 8 MG/3ML SOPN 9 mL 1    Sig: Inject 2 mg as directed once a week. Dx: E11.65     Endocrinology:  Diabetes - GLP-1 Receptor Agonists - semaglutide  Failed - 03/03/2024  3:55 PM      Failed - Cr in normal range and within 360 days    Creatinine, Ser  Date Value Ref Range Status  12/21/2022 1.07 (H) 0.44 - 1.00 mg/dL Final         Passed - HBA1C in normal range and within 180 days    HbA1c, POC (controlled diabetic range)  Date Value Ref Range Status  11/24/2023 6.6 0.0 - 7.0 % Final         Passed - Valid encounter within last 6 months    Recent Outpatient Visits           3 months ago Type 2 diabetes mellitus with hyperglycemia, with long-term current use of insulin  [facility])   [place] [facility], Raguel, MD   9 months ago Type 2 diabetes mellitus with hyperglycemia, with long-term current use of insulin  [facility])   [place] [facility], Raguel, MD   1 year ago Type 2 diabetes mellitus with hyperglycemia, with long-term current use of insulin  [facility])   [place] [facility], MD   1 year ago Acute cystitis without hematuria   [place] [facility], Raguel, MD   1 year ago Type 2 diabetes mellitus with hyperglycemia, with long-term current use of insulin  [facility])   [place] [facility], MD

## 2024-03-14 ENCOUNTER — Inpatient Hospital Stay: Admission: RE | Admit: 2024-03-14 | Source: Ambulatory Visit

## 2024-03-14 ENCOUNTER — Other Ambulatory Visit: Payer: Self-pay | Admitting: Family Medicine

## 2024-03-14 ENCOUNTER — Telehealth: Payer: Self-pay

## 2024-03-14 DIAGNOSIS — E1165 Type 2 diabetes mellitus with hyperglycemia: Secondary | ICD-10-CM

## 2024-03-14 MED ORDER — SEMAGLUTIDE (2 MG/DOSE) 8 MG/3ML ~~LOC~~ SOPN
2.0000 mg | PEN_INJECTOR | SUBCUTANEOUS | 0 refills | Status: DC
Start: 2024-03-14 — End: 2024-06-02

## 2024-03-14 NOTE — Telephone Encounter (Signed)
 Copied from CRM #[id number]. Topic: Clinical - Prescription Issue >> Mar 14, 2024  1:51 PM Farrel B wrote: Reason for CRM: patient called from [phone], states the prescription Semaglutide , 2 MG/DOSE, 8 MG/3ML SOPN, insurance rejected the refill due to the diagnosis code. Patient has requested a call back to update her on the status of the medication refill being resent corrected

## 2024-05-16 ENCOUNTER — Ambulatory Visit

## 2024-05-18 ENCOUNTER — Other Ambulatory Visit: Payer: Self-pay

## 2024-05-26 ENCOUNTER — Ambulatory Visit: Admitting: Family Medicine

## 2024-06-02 ENCOUNTER — Other Ambulatory Visit: Payer: Self-pay | Admitting: Family Medicine

## 2024-06-02 DIAGNOSIS — Z794 Long term (current) use of insulin: Secondary | ICD-10-CM

## 2024-06-02 NOTE — Telephone Encounter (Signed)
 Copied from CRM #[id number]. Topic: Clinical - Medication Refill >> Jun 02, 2024 10:53 AM Charlet HERO wrote: Medication: Semaglutide , 2 MG/DOSE, 8 MG/3ML SOPN  Has the patient contacted their pharmacy? Yes 0 refill  This is the patient's preferred pharmacy:  [facility] - [place], [place] - [address] [facility] [address] [place] [address] [place] [address] Phone: [phone] Fax: [phone]    Is this the correct pharmacy for this prescription? Yes If no, delete pharmacy and type the correct one.   Has the prescription been filled recently? Yes  Is the patient out of the medication? Yes  Has the patient been seen for an appointment in the last year OR does the patient have an upcoming appointment? Yes  Can we respond through MyChart? Yes  Agent: Please be advised that Rx refills may take up to 3 business days. We ask that you follow-up with your pharmacy.

## 2024-06-04 NOTE — Telephone Encounter (Signed)
 Requested medications are due for refill today.  yes  Requested medications are on the active medications list.  yes  Last refill. 03/14/2024 9mL 0 rf  Future visit scheduled.   yes  Notes to clinic.  Expired labs    Requested Prescriptions  Pending Prescriptions Disp Refills   Semaglutide , 2 MG/DOSE, 8 MG/3ML SOPN 9 mL 0    Sig: Inject 2 mg as directed once a week. Dx: E11.65     Endocrinology:  Diabetes - GLP-1 Receptor Agonists - semaglutide  Failed - 06/04/2024  3:01 PM      Failed - HBA1C in normal range and within 180 days    HbA1c, POC (controlled diabetic range)  Date Value Ref Range Status  11/24/2023 6.6 0.0 - 7.0 % Final         Failed - Cr in normal range and within 360 days    Creatinine, Ser  Date Value Ref Range Status  12/21/2022 1.07 (H) 0.44 - 1.00 mg/dL Final         Failed - Valid encounter within last 6 months    Recent Outpatient Visits           6 months ago Type 2 diabetes mellitus with hyperglycemia, with long-term current use of insulin  [facility])   [place] [facility], Raguel, MD   1 year ago Type 2 diabetes mellitus with hyperglycemia, with long-term current use of insulin  [facility])   [place] [facility] [place], [place], [place]   1 year ago Type 2 diabetes mellitus with hyperglycemia, with long-term current use of insulin  [facility])   [place] [facility], MD   1 year ago Acute cystitis without hematuria   [place] [facility], Raguel, MD   1 year ago Type 2 diabetes mellitus with hyperglycemia, with long-term current use of insulin  [facility])   [place] [facility], MD

## 2024-06-09 MED ORDER — SEMAGLUTIDE (2 MG/DOSE) 8 MG/3ML ~~LOC~~ SOPN: 2.0000 mg | PEN_INJECTOR | SUBCUTANEOUS | 0 refills | Status: DC

## 2024-06-12 ENCOUNTER — Other Ambulatory Visit: Payer: Self-pay | Admitting: Family Medicine

## 2024-06-12 DIAGNOSIS — Z794 Long term (current) use of insulin: Secondary | ICD-10-CM

## 2024-06-14 ENCOUNTER — Ambulatory Visit (INDEPENDENT_AMBULATORY_CARE_PROVIDER_SITE_OTHER): Admitting: Family Medicine

## 2024-06-14 ENCOUNTER — Encounter: Payer: Self-pay | Admitting: Family Medicine

## 2024-06-14 VITALS — BP 134/77 | HR 81 | Ht 64.0 in | Wt 232.4 lb

## 2024-06-14 DIAGNOSIS — M810 Age-related osteoporosis without current pathological fracture: Secondary | ICD-10-CM

## 2024-06-14 DIAGNOSIS — E66812 Obesity, class 2: Secondary | ICD-10-CM

## 2024-06-14 DIAGNOSIS — E782 Mixed hyperlipidemia: Secondary | ICD-10-CM

## 2024-06-14 DIAGNOSIS — E1165 Type 2 diabetes mellitus with hyperglycemia: Secondary | ICD-10-CM

## 2024-06-14 DIAGNOSIS — Z6839 Body mass index (BMI) 39.0-39.9, adult: Secondary | ICD-10-CM | POA: Diagnosis not present

## 2024-06-14 DIAGNOSIS — I10 Essential (primary) hypertension: Secondary | ICD-10-CM

## 2024-06-14 DIAGNOSIS — Z794 Long term (current) use of insulin: Secondary | ICD-10-CM

## 2024-06-14 MED ORDER — LISINOPRIL 10 MG PO TABS: 10.0000 mg | ORAL_TABLET | Freq: Every day | ORAL | 3 refills | Status: AC

## 2024-06-14 MED ORDER — ATORVASTATIN CALCIUM 20 MG PO TABS: 20.0000 mg | ORAL_TABLET | Freq: Every day | ORAL | 1 refills | Status: AC

## 2024-06-14 MED ORDER — ALENDRONATE SODIUM 10 MG PO TABS: 10.0000 mg | ORAL_TABLET | Freq: Every day | ORAL | 1 refills | Status: AC

## 2024-06-14 MED ORDER — SEMAGLUTIDE (2 MG/DOSE) 8 MG/3ML ~~LOC~~ SOPN: 2.0000 mg | PEN_INJECTOR | SUBCUTANEOUS | 1 refills | Status: AC

## 2024-06-14 NOTE — Progress Notes (Signed)
 Established Patient Office Visit  Subjective    Patient ID: Emily Gray, female    DOB: Jan 02, 1946  Age: 78 y.o. MRN: [id number]  CC:  Chief Complaint  Patient presents with   Medical Management of Chronic Issues    HPI Emily Gray presents for routine follow up of chronic med issues including diabetes and hypertension. Patient reports that she was having low blood pressure and now has stopped taking her blood pressure meds.   Outpatient Encounter Medications as of 06/14/2024  Medication Sig   aspirin  81 MG tablet Take 81 mg by mouth daily.   Blood Glucose Monitoring Suppl (ONETOUCH VERIO REFLECT) w/Device KIT Use to check blood sugar TID. E11.65   cholecalciferol  (VITAMIN D3) 25 MCG (1000 UT) tablet Take 1 tablet (1,000 Units total) by mouth daily.   COD LIVER OIL/VITAMINS A & D PO Take by mouth.   glucose blood (ONETOUCH VERIO) test strip Use to check blood sugar TID. E11.65   ibuprofen  (ADVIL ) 600 MG tablet Take 1 tablet (600 mg total) by mouth every 8 (eight) hours as needed. Use sparingly   lisinopril  (ZESTRIL ) 10 MG tablet Take 1 tablet (10 mg total) by mouth daily.   lisinopril -hydrochlorothiazide  (ZESTORETIC ) 20-12.5 MG tablet Take 2 tablets by mouth daily.   VITAMIN D  PO Take by mouth.   [DISCONTINUED] alendronate  (FOSAMAX ) 10 MG tablet Take 1 tablet (10 mg total) by mouth daily before breakfast.   [DISCONTINUED] atorvastatin  (LIPITOR) 20 MG tablet Take 1 tablet (20 mg total) by mouth daily.   [DISCONTINUED] Semaglutide , 2 MG/DOSE, 8 MG/3ML SOPN Inject 2 mg as directed once a week. Dx: E11.65   alendronate  (FOSAMAX ) 10 MG tablet Take 1 tablet (10 mg total) by mouth daily before breakfast.   atorvastatin  (LIPITOR) 20 MG tablet Take 1 tablet (20 mg total) by mouth daily.   chlorhexidine (PERIDEX) 0.12 % solution  (Patient not taking: Reported on 06/14/2024)   Continuous Glucose Sensor (FREESTYLE LIBRE 2 SENSOR) MISC Utilize as directed q2 weeks to monitor blood glucose -  E11.9 (Patient not taking: Reported on 06/14/2024)   insulin  glargine-yfgn (SEMGLEE ) 100 UNIT/ML Pen  (Patient not taking: Reported on 06/14/2024)   Insulin  Pen Needle 32G X 4 MM MISC Use with insulin  pen daily. Dx: E11.9   letrozole  (FEMARA ) 2.5 MG tablet    Semaglutide , 2 MG/DOSE, 8 MG/3ML SOPN Inject 2 mg as directed once a week. Dx: E11.65   [DISCONTINUED] furosemide (LASIX) 20 MG tablet  (Patient not taking: Reported on 06/14/2024)   [DISCONTINUED] gabapentin  (NEURONTIN ) 300 MG capsule Take 1 capsule by mouth 3 (three) times daily. (Patient not taking: Reported on 06/14/2024)   [DISCONTINUED] insulin  aspart (NOVOLOG  FLEXPEN) 100 UNIT/ML FlexPen Inject 12 Units into the skin 2 (two) times daily. (Patient not taking: Reported on 06/14/2024)   [DISCONTINUED] nitrofurantoin , macrocrystal-monohydrate, (MACROBID ) 100 MG capsule Take 1 capsule (100 mg total) by mouth 2 (two) times daily. (Patient not taking: Reported on 06/14/2024)   No facility-administered encounter medications on file as of 06/14/2024.    Past Medical History:  Diagnosis Date   Acid reflux    Arthritis    Breast cancer (HCC)    Hypertension    Obesity, unspecified    Pure hyperglyceridemia     Past Surgical History:  Procedure Laterality Date   APPENDECTOMY     BILATERAL SALPINGOOPHORECTOMY     BREAST LUMPECTOMY Right    CATARACT EXTRACTION Left    EYE SURGERY Left 07/20/2008   macro-hole, filled with gel  substance at [facility]   HYSTEROSCOPY WITH D & C  12/03/2004   TONSILLECTOMY     TUBAL LIGATION Bilateral     Family History  Problem Relation Age of Onset   Hypertension Mother    Cancer Father    Hypertension Father    Breast cancer Sister     Social History   Socioeconomic History   Marital status: Married    Spouse name: Not on file   Number of children: Not on file   Years of education: Not on file   Highest education level: Not on file  Occupational History   Not on file  Tobacco Use    Smoking status: Former   Smokeless tobacco: Never  Vaping Use   Vaping status: Never Used  Substance and Sexual Activity   Alcohol use: No   Drug use: No   Sexual activity: Not Currently  Other Topics Concern   Not on file  Social History Narrative   Not on file   Social Drivers of Health   Financial Resource Strain: Low Risk  (08/05/2023)   Overall Financial Resource Strain (CARDIA)    Difficulty of Paying Living Expenses: Not hard at all  Food Insecurity: No Food Insecurity (08/05/2023)   Hunger Vital Sign    Worried About Running Out of Food in the Last Year: Never true    Ran Out of Food in the Last Year: Never true  Transportation Needs: No Transportation Needs (08/05/2023)   PRAPARE - Administrator, Civil Service (Medical): No    Lack of Transportation (Non-Medical): No  Physical Activity: Inactive (08/05/2023)   Exercise Vital Sign    Days of Exercise per Week: 0 days    Minutes of Exercise per Session: 0 min  Stress: No Stress Concern Present (08/05/2023)   [employer] of Occupational Health - Occupational Stress Questionnaire    Feeling of Stress : Not at all  Social Connections: Moderately Isolated (08/05/2023)   Social Connection and Isolation Panel    Frequency of Communication with Friends and Family: More than three times a week    Frequency of Social Gatherings with Friends and Family: Not on file    Attends Religious Services: Never    Database Administrator or Organizations: No    Attends Banker Meetings: Never    Marital Status: Married  Catering Manager Violence: Not At Risk (08/05/2023)   Humiliation, Afraid, Rape, and Kick questionnaire    Fear of Current or Ex-Partner: No    Emotionally Abused: No    Physically Abused: No    Sexually Abused: No    Review of Systems  All other systems reviewed and are negative.       Objective    BP 134/77   Pulse 81   Ht 5' 4 (1.626 m)   Wt 232 lb 6.4 oz (105.4 kg)   SpO2  95%   BMI 39.89 kg/m   Physical Exam Vitals and nursing note reviewed.  Constitutional:      General: She is not in acute distress. Cardiovascular:     Rate and Rhythm: Normal rate and regular rhythm.  Pulmonary:     Effort: Pulmonary effort is normal.     Breath sounds: Normal breath sounds.  Musculoskeletal:     Comments: Patient utilizing cane for stability  Neurological:     General: No focal deficit present.     Mental Status: She is alert and oriented to person, place, and time.  Assessment & Plan:   Type 2 diabetes mellitus with hyperglycemia, with long-term current use of insulin  (HCC) -     Semaglutide  (2 MG/DOSE); Inject 2 mg as directed once a week. Dx: E11.65  Dispense: 9 mL; Refill: 1 -     Comprehensive metabolic panel with GFR -     Lipid panel -     POCT glycosylated hemoglobin (Hb A1C)  Essential hypertension -     Comprehensive metabolic panel with GFR -     Lipid panel  Mixed hyperlipidemia -     Atorvastatin  Calcium ; Take 1 tablet (20 mg total) by mouth daily.  Dispense: 90 tablet; Refill: 1 -     Comprehensive metabolic panel with GFR -     Lipid panel  Class 2 severe obesity due to excess calories with serious comorbidity and body mass index (BMI) of 39.0 to 39.9 in adult  Osteoporosis, unspecified osteoporosis type, unspecified pathological fracture presence -     DG Bone Density; Future  Other orders -     Alendronate  Sodium; Take 1 tablet (10 mg total) by mouth daily before breakfast.  Dispense: 90 tablet; Refill: 1 -     Lisinopril ; Take 1 tablet (10 mg total) by mouth daily.  Dispense: 90 tablet; Refill: 3   Will d/c lisinopril  -hydrochlorothiazide  and prescribe lisinopril  10 mg daily.   Return in about 6 months (around 12/12/2024) for follow up, chronic med issues.   Tanda Raguel SQUIBB, MD

## 2024-06-15 LAB — COMPREHENSIVE METABOLIC PANEL WITH GFR
ALT: 11 IU/L (ref 0–32)
AST: 15 IU/L (ref 0–40)
Albumin: 4.1 g/dL (ref 3.8–4.8)
Alkaline Phosphatase: 119 IU/L (ref 49–135)
BUN/Creatinine Ratio: 20 (ref 12–28)
BUN: 16 mg/dL (ref 8–27)
Bilirubin Total: 0.2 mg/dL (ref 0.0–1.2)
CO2: 22 mmol/L (ref 20–29)
Calcium: 8.9 mg/dL (ref 8.7–10.3)
Chloride: 102 mmol/L (ref 96–106)
Creatinine, Ser: 0.82 mg/dL (ref 0.57–1.00)
Globulin, Total: 2.7 g/dL (ref 1.5–4.5)
Glucose: 118 mg/dL — ABNORMAL HIGH (ref 70–99)
Potassium: 3.9 mmol/L (ref 3.5–5.2)
Sodium: 140 mmol/L (ref 134–144)
Total Protein: 6.8 g/dL (ref 6.0–8.5)
eGFR: 74 mL/min/1.73 (ref >59)

## 2024-06-15 LAB — LIPID PANEL
Chol/HDL Ratio: 3.2 ratio (ref 0.0–4.4)
Cholesterol, Total: 136 mg/dL (ref 100–199)
HDL: 42 mg/dL (ref >39)
LDL Chol Calc (NIH): 79 mg/dL (ref 0–99)
Triglycerides: 74 mg/dL (ref 0–149)
VLDL Cholesterol Cal: 15 mg/dL (ref 5–40)

## 2024-06-19 ENCOUNTER — Ambulatory Visit: Payer: Self-pay | Admitting: Family Medicine

## 2024-06-23 ENCOUNTER — Other Ambulatory Visit: Payer: Self-pay | Admitting: Family Medicine

## 2024-06-23 DIAGNOSIS — E1165 Type 2 diabetes mellitus with hyperglycemia: Secondary | ICD-10-CM

## 2024-06-23 NOTE — Telephone Encounter (Signed)
 Copied from CRM #[id number]. Topic: Clinical - Medication Refill >> Jun 23, 2024  2:39 PM Cherylann S wrote: Medication: Semaglutide , 2 MG/DOSE, 8 MG/3ML SOPN   Has the patient contacted their pharmacy? Yes (Agent: If no, request that the patient contact the pharmacy for the refill. If patient does not wish to contact the pharmacy document the reason why and proceed with request.) (Agent: If yes, when and what did the pharmacy advise?) no refills remaining, informed to call provider's office  This is the patient's preferred pharmacy:  [facility] - [place], [place] - [address] [facility] [address] [place] [address] [address] Phone: [phone] Fax: [phone]  Is this the correct pharmacy for this prescription? Yes If no, delete pharmacy and type the correct one.   Has the prescription been filled recently? No  Is the patient out of the medication? Yes  Has the patient been seen for an appointment in the last year OR does the patient have an upcoming appointment? Yes  Can we respond through MyChart? Yes  Agent: Please be advised that Rx refills may take up to 3 business days. We ask that you follow-up with your pharmacy.

## 2024-06-26 NOTE — Telephone Encounter (Signed)
 Rx 06/14/24 9ml 1RF- should have on file Requested Prescriptions  Pending Prescriptions Disp Refills   Semaglutide , 2 MG/DOSE, 8 MG/3ML SOPN 9 mL 1    Sig: Inject 2 mg as directed once a week. Dx: E11.65     Endocrinology:  Diabetes - GLP-1 Receptor Agonists - semaglutide  Failed - 06/26/2024  4:30 PM      Failed - HBA1C in normal range and within 180 days    HbA1c, POC (controlled diabetic range)  Date Value Ref Range Status  11/24/2023 6.6 0.0 - 7.0 % Final         Passed - Cr in normal range and within 360 days    Creatinine, Ser  Date Value Ref Range Status  06/14/2024 0.82 0.57 - 1.00 mg/dL Final         Passed - Valid encounter within last 6 months    Recent Outpatient Visits           1 week ago Type 2 diabetes mellitus with hyperglycemia, with long-term current use of insulin  (HCC)   [facility], Raguel, MD   7 months ago Type 2 diabetes mellitus with hyperglycemia, with long-term current use of insulin  [facility])   [place] [facility], Raguel, MD   1 year ago Type 2 diabetes mellitus with hyperglycemia, with long-term current use of insulin  [facility])   [place] [facility], Raguel, MD   1 year ago Type 2 diabetes mellitus with hyperglycemia, with long-term current use of insulin  [facility])   [place] [facility], MD   1 year ago Acute cystitis without hematuria    [facility], MD

## 2024-07-11 ENCOUNTER — Encounter

## 2024-08-01 ENCOUNTER — Encounter

## 2024-08-01 ENCOUNTER — Ambulatory Visit
Admission: RE | Admit: 2024-08-01 | Discharge: 2024-08-01 | Disposition: A | Source: Ambulatory Visit | Attending: Family Medicine | Admitting: Family Medicine

## 2024-08-01 DIAGNOSIS — Z1231 Encounter for screening mammogram for malignant neoplasm of breast: Secondary | ICD-10-CM

## 2024-08-03 ENCOUNTER — Ambulatory Visit: Payer: Self-pay | Admitting: Family Medicine

## 2024-08-17 ENCOUNTER — Ambulatory Visit: Payer: Medicare Other

## 2024-08-17 ENCOUNTER — Telehealth: Payer: Self-pay

## 2024-08-17 VITALS — Ht 64.0 in | Wt 232.0 lb

## 2024-08-17 DIAGNOSIS — Z780 Asymptomatic menopausal state: Secondary | ICD-10-CM

## 2024-08-17 DIAGNOSIS — Z0000 Encounter for general adult medical examination without abnormal findings: Secondary | ICD-10-CM

## 2024-08-17 NOTE — Patient Instructions (Addendum)
 Emily Gray,  Thank you for taking the time for your Medicare Wellness Visit. I appreciate your continued commitment to your health goals. Please review the care plan we discussed, and feel free to reach out if I can assist you further.  Please note that Annual Wellness Visits do not include a physical exam. Some assessments may be limited, especially if the visit was conducted virtually. If needed, we may recommend an in-person follow-up with your provider.  Ongoing Care Seeing your primary care provider every 3 to 6 months helps us  monitor your health and provide consistent, personalized care.   Referrals If a referral was made during today's visit and you haven't received any updates within two weeks, please contact the referred provider directly to check on the status.  Recommended Screenings:  Health Maintenance  Topic Date Due   Eye exam for diabetics  Never done   Osteoporosis screening with Bone Density Scan  07/20/2020   Complete foot exam   01/04/2024   Kidney health urinalysis for diabetes  05/26/2024   Hemoglobin A1C  05/26/2024   Yearly kidney function blood test for diabetes  06/14/2025   Breast Cancer Screening  08/01/2025   Medicare Annual Wellness Visit  08/17/2025   Hepatitis C Screening  Completed   Meningitis B Vaccine  Aged Out   DTaP/Tdap/Td vaccine  Discontinued   Pneumococcal Vaccine for age over 59  Discontinued   Flu Shot  Discontinued   Colon Cancer Screening  Discontinued   COVID-19 Vaccine  Discontinued   Zoster (Shingles) Vaccine  Discontinued       08/17/2024   11:40 AM  Advanced Directives  Does Patient Have a Medical Advance Directive? No  Would patient like information on creating a medical advance directive? No - Patient declined    Vision: Annual vision screenings are recommended for early detection of glaucoma, cataracts, and diabetic retinopathy. These exams can also reveal signs of chronic conditions such as diabetes and high blood  pressure.  Dental: Annual dental screenings help detect early signs of oral cancer, gum disease, and other conditions linked to overall health, including heart disease and diabetes.

## 2024-08-17 NOTE — Telephone Encounter (Cosign Needed)
 Needs a new glucometer and supplies. Please call.

## 2024-08-17 NOTE — Progress Notes (Signed)
 "  Chief Complaint  Patient presents with   Medicare Wellness     Subjective:   Emily Gray is a 79 y.o. female who presents for a Medicare Annual Wellness Visit.  Visit info / Clinical Intake: Medicare Wellness Visit Type:: Subsequent Annual Wellness Visit Persons participating in visit and providing information:: patient Medicare Wellness Visit Mode:: Telephone If telephone:: video declined Since this visit was completed virtually, some vitals may be partially provided or unavailable. Missing vitals are due to the limitations of the virtual format.: Documented vitals are patient reported If Telephone or Video please confirm:: I connected with patient using audio/video enable telemedicine. I verified patient identity with two identifiers, discussed telehealth limitations, and patient agreed to proceed. Patient Location:: Home Provider Location:: Office Interpreter Needed?: No Pre-visit prep was completed: yes AWV questionnaire completed by patient prior to visit?: no Living arrangements:: lives with spouse/significant other Patient's Overall Health Status Rating: good Typical amount of pain: none Does pain affect daily life?: no Are you currently prescribed opioids?: no  Dietary Habits and Nutritional Risks How many meals a day?: 3 Eats fruit and vegetables daily?: yes Most meals are obtained by: preparing own meals; eating out In the last 2 weeks, have you had any of the following?: none Diabetic:: (!) yes Any non-healing wounds?: no How often do you check your BS?: as needed; 2 Would you like to be referred to a Nutritionist or for Diabetic Management? : no  Functional Status Activities of Daily Living (to include ambulation/medication): Independent Ambulation: Independent with device- listed below Home Assistive Devices/Equipment: Eyeglasses; Cane Medication Administration: Independent Home Management (perform basic housework or laundry): Independent Manage your own  finances?: yes Primary transportation is: driving Concerns about vision?: no *vision screening is required for WTM* Concerns about hearing?: no  Fall Screening Falls in the past year?: 0 Number of falls in past year: 0 Was there an injury with Fall?: 0 Fall Risk Category Calculator: 0 Patient Fall Risk Level: Low Fall Risk  Fall Risk Patient at Risk for Falls Due to: No Fall Risks Fall risk Follow up: Falls evaluation completed; Falls prevention discussed  Home and Transportation Safety: All rugs have non-skid backing?: N/A, no rugs All stairs or steps have railings?: yes Grab bars in the bathtub or shower?: yes Have non-skid surface in bathtub or shower?: yes Good home lighting?: yes Regular seat belt use?: yes Hospital stays in the last year:: no  Cognitive Assessment Difficulty concentrating, remembering, or making decisions? : no Will 6CIT or Mini Cog be Completed: yes What year is it?: 0 points What month is it?: 0 points Give patient an address phrase to remember (5 components): [address] [place], [place] About what time is it?: 0 points Count backwards from 20 to 1: 0 points Say the months of the year in reverse: 0 points Repeat the address phrase from earlier: 0 points 6 CIT Score: 0 points  Advance Directives (For Healthcare) Does Patient Have a Medical Advance Directive?: No Would patient like information on creating a medical advance directive?: No - Patient declined  Reviewed/Updated  Reviewed/Updated: Reviewed All (Medical, Surgical, Family, Medications, Allergies, Care Teams, Patient Goals)    Allergies (verified) Patient has no known allergies.   Current Medications (verified) Outpatient Encounter Medications as of 08/17/2024  Medication Sig   alendronate  (FOSAMAX ) 10 MG tablet Take 1 tablet (10 mg total) by mouth daily before breakfast.   aspirin  81 MG tablet Take 81 mg by mouth daily.   atorvastatin  (LIPITOR) 20  MG tablet Take 1 tablet (20 mg  total) by mouth daily.   Blood Glucose Monitoring Suppl (ONETOUCH VERIO REFLECT) w/Device KIT Use to check blood sugar TID. E11.65   cholecalciferol  (VITAMIN D3) 25 MCG (1000 UT) tablet Take 1 tablet (1,000 Units total) by mouth daily.   COD LIVER OIL/VITAMINS A & D PO Take by mouth.   glucose blood (ONETOUCH VERIO) test strip Use to check blood sugar TID. E11.65   ibuprofen  (ADVIL ) 600 MG tablet Take 1 tablet (600 mg total) by mouth every 8 (eight) hours as needed. Use sparingly   letrozole  (FEMARA ) 2.5 MG tablet    lisinopril  (ZESTRIL ) 10 MG tablet Take 1 tablet (10 mg total) by mouth daily.   lisinopril -hydrochlorothiazide  (ZESTORETIC ) 20-12.5 MG tablet Take 2 tablets by mouth daily.   Semaglutide , 2 MG/DOSE, 8 MG/3ML SOPN Inject 2 mg as directed once a week. Dx: E11.65   VITAMIN D  PO Take by mouth.   chlorhexidine (PERIDEX) 0.12 % solution  (Patient not taking: Reported on 08/17/2024)   Continuous Glucose Sensor (FREESTYLE LIBRE 2 SENSOR) MISC Utilize as directed q2 weeks to monitor blood glucose - E11.9 (Patient not taking: Reported on 08/17/2024)   insulin  glargine-yfgn (SEMGLEE ) 100 UNIT/ML Pen  (Patient not taking: Reported on 08/17/2024)   Insulin  Pen Needle 32G X 4 MM MISC Use with insulin  pen daily. Dx: E11.9 (Patient not taking: Reported on 08/17/2024)   No facility-administered encounter medications on file as of 08/17/2024.    History: Past Medical History:  Diagnosis Date   Acid reflux    Arthritis    Breast cancer (HCC)    Hypertension    Obesity, unspecified    Pure hyperglyceridemia    Past Surgical History:  Procedure Laterality Date   APPENDECTOMY     BILATERAL SALPINGOOPHORECTOMY     BREAST LUMPECTOMY Right    CATARACT EXTRACTION Left    EYE SURGERY Left 07/20/2008   macro-hole, filled with gel substance at [facility]   HYSTEROSCOPY WITH D & C  12/03/2004   TONSILLECTOMY     TUBAL LIGATION Bilateral    Family History  Problem Relation Age of Onset   Hypertension  Mother    Cancer Father    Hypertension Father    Breast cancer Sister    Social History   Occupational History   Not on file  Tobacco Use   Smoking status: Former   Smokeless tobacco: Never  Vaping Use   Vaping status: Never Used  Substance and Sexual Activity   Alcohol use: No   Drug use: No   Sexual activity: Not Currently   Tobacco Counseling Counseling given: Yes  SDOH Screenings   Food Insecurity: No Food Insecurity (08/17/2024)  Housing: Unknown (08/17/2024)  Transportation Needs: No Transportation Needs (08/17/2024)  Utilities: Not At Risk (08/17/2024)  Alcohol Screen: Low Risk (08/05/2023)  Depression (PHQ2-9): Low Risk (08/17/2024)  Financial Resource Strain: Low Risk (08/05/2023)  Physical Activity: Insufficiently Active (08/17/2024)  Social Connections: Moderately Isolated (08/17/2024)  Stress: No Stress Concern Present (08/17/2024)  Tobacco Use: Medium Risk (08/17/2024)  Health Literacy: Adequate Health Literacy (08/17/2024)   See flowsheets for full screening details  Depression Screen PHQ 2 & 9 Depression Scale- Over the past 2 weeks, how often have you been bothered by any of the following problems? Little interest or pleasure in doing things: 0 Feeling down, depressed, or hopeless (PHQ Adolescent also includes...irritable): 0 PHQ-2 Total Score: 0 Trouble falling or staying asleep, or sleeping too much: 0 Feeling tired  or having little energy: 0 Poor appetite or overeating (PHQ Adolescent also includes...weight loss): 0 Feeling bad about yourself - or that you are a failure or have let yourself or your family down: 0 Trouble concentrating on things, such as reading the newspaper or watching television (PHQ Adolescent also includes...like school work): 0 Moving or speaking so slowly that other people could have noticed. Or the opposite - being so fidgety or restless that you have been moving around a lot more than usual: 3 (uses a cane) Thoughts that you would be  better off dead, or of hurting yourself in some way: 0 PHQ-9 Total Score: 3 If you checked off any problems, how difficult have these problems made it for you to do your work, take care of things at home, or get along with other people?: Not difficult at all  Depression Treatment Depression Interventions/Treatment : EYV7-0 Score <4 Follow-up Not Indicated     Goals Addressed               This Visit's Progress     Patient Stated (pt-stated)        Patient stated she plans to continue exercising              Objective:    Today's Vitals   08/17/24 1138  Weight: 232 lb (105.2 kg)  Height: 5' 4 (1.626 m)   Body mass index is 39.82 kg/m.  Hearing/Vision screen Hearing Screening - Comments:: Denies hearing difficulties   Vision Screening - Comments:: Wears rx glasses - plans to schedule an appt w/Optometrist Immunizations and Health Maintenance Health Maintenance  Topic Date Due   OPHTHALMOLOGY EXAM  Never done   Bone Density Scan  07/20/2020   FOOT EXAM  01/04/2024   Diabetic kidney evaluation - Urine ACR  05/26/2024   HEMOGLOBIN A1C  05/26/2024   Diabetic kidney evaluation - eGFR measurement  06/14/2025   Mammogram  08/01/2025   Medicare Annual Wellness (AWV)  08/17/2025   Hepatitis C Screening  Completed   Meningococcal B Vaccine  Aged Out   DTaP/Tdap/Td  Discontinued   Pneumococcal Vaccine: 50+ Years  Discontinued   Influenza Vaccine  Discontinued   Colonoscopy  Discontinued   COVID-19 Vaccine  Discontinued   Zoster Vaccines- Shingrix  Discontinued        Assessment/Plan:  This is a routine wellness examination for Emily Gray.  DEXA Scan status: ordered today  Patient Care Team: Tanda Bleacher, MD as PCP - General (Family Medicine)  I have personally reviewed and noted the following in the patients chart:   Medical and social history Use of alcohol, tobacco or illicit drugs  Current medications and supplements including opioid  prescriptions. Functional ability and status Nutritional status Physical activity Advanced directives List of other physicians Hospitalizations, surgeries, and ER visits in previous 12 months Vitals Screenings to include cognitive, depression, and falls Referrals and appointments  Orders Placed This Encounter  Procedures   DG Bone Density    Standing Status:   Future    Expiration Date:   08/17/2025    Reason for Exam (SYMPTOM  OR DIAGNOSIS REQUIRED):   post menopausal estrogen deficient    Preferred imaging location?:   [address]   In addition, I have reviewed and discussed with patient certain preventive protocols, quality metrics, and best practice recommendations. A written personalized care plan for preventive services as well as general preventive health recommendations were provided to patient.   Emily Gray CHRISTELLA Saba, CMA   08/17/2024  Return in 1 year (on 08/17/2025).  After Visit Summary: (MyChart) Due to this being a telephonic visit, the after visit summary with patients personalized plan was offered to patient via MyChart   Nurse Notes: scheduled 2027 AWV appt "

## 2024-08-18 ENCOUNTER — Other Ambulatory Visit: Payer: Self-pay | Admitting: Family Medicine

## 2024-08-18 DIAGNOSIS — E1165 Type 2 diabetes mellitus with hyperglycemia: Secondary | ICD-10-CM

## 2024-08-18 MED ORDER — ONETOUCH VERIO VI STRP: ORAL_STRIP | 2 refills | Status: AC

## 2024-08-18 MED ORDER — LANCET DEVICE MISC: 1.0000 | 0 refills | Status: AC

## 2024-08-18 MED ORDER — ONETOUCH VERIO REFLECT W/DEVICE KIT: PACK | 0 refills | Status: AC

## 2024-08-18 MED ORDER — LANCETS MISC: 1.0000 | 0 refills | Status: AC

## 2024-12-12 ENCOUNTER — Ambulatory Visit: Admitting: Family Medicine

## 2025-08-30 ENCOUNTER — Ambulatory Visit: Payer: Self-pay
